# Patient Record
Sex: Female | Born: 1965
Health system: Southern US, Community
[De-identification: ages and names within clinical notes are randomized; demographics above are authoritative.]

## PROBLEM LIST (undated history)

## (undated) DIAGNOSIS — I10 Essential (primary) hypertension: Secondary | ICD-10-CM

## (undated) DIAGNOSIS — H0520 Unspecified exophthalmos: Secondary | ICD-10-CM

## (undated) DIAGNOSIS — E785 Hyperlipidemia, unspecified: Secondary | ICD-10-CM

## (undated) DIAGNOSIS — E119 Type 2 diabetes mellitus without complications: Secondary | ICD-10-CM

## (undated) DIAGNOSIS — G5600 Carpal tunnel syndrome, unspecified upper limb: Secondary | ICD-10-CM

## (undated) HISTORY — DX: Hyperlipidemia, unspecified: E78.5

## (undated) HISTORY — DX: Unspecified exophthalmos: H05.20

## (undated) HISTORY — DX: Essential (primary) hypertension: I10

## (undated) HISTORY — DX: Type 2 diabetes mellitus without complications: E11.9

## (undated) HISTORY — PX: WISDOM TOOTH EXTRACTION: SHX21

## (undated) HISTORY — PX: TONSILLECTOMY: SUR1361

## (undated) HISTORY — DX: Carpal tunnel syndrome, unspecified upper limb: G56.00

## (undated) HISTORY — PX: VITRECTOMY: SHX106

---

## 1998-11-02 ENCOUNTER — Other Ambulatory Visit: Admission: RE | Admit: 1998-11-02 | Discharge: 1998-11-02 | Payer: Self-pay | Admitting: Gynecology

## 1999-11-06 ENCOUNTER — Other Ambulatory Visit: Admission: RE | Admit: 1999-11-06 | Discharge: 1999-11-06 | Payer: Self-pay | Admitting: Gynecology

## 2000-11-25 ENCOUNTER — Other Ambulatory Visit: Admission: RE | Admit: 2000-11-25 | Discharge: 2000-11-25 | Payer: Self-pay | Admitting: Gynecology

## 2001-11-24 ENCOUNTER — Other Ambulatory Visit: Admission: RE | Admit: 2001-11-24 | Discharge: 2001-11-24 | Payer: Self-pay | Admitting: Gynecology

## 2002-01-03 ENCOUNTER — Emergency Department (HOSPITAL_COMMUNITY): Admission: EM | Admit: 2002-01-03 | Discharge: 2002-01-04 | Payer: Self-pay | Admitting: Emergency Medicine

## 2002-01-05 ENCOUNTER — Encounter: Payer: Self-pay | Admitting: Gynecology

## 2002-01-05 ENCOUNTER — Encounter: Admission: RE | Admit: 2002-01-05 | Discharge: 2002-01-05 | Payer: Self-pay | Admitting: Gynecology

## 2002-07-05 ENCOUNTER — Encounter: Admission: RE | Admit: 2002-07-05 | Discharge: 2002-07-05 | Payer: Self-pay | Admitting: General Surgery

## 2002-07-05 ENCOUNTER — Encounter: Payer: Self-pay | Admitting: General Surgery

## 2002-12-21 ENCOUNTER — Other Ambulatory Visit: Admission: RE | Admit: 2002-12-21 | Discharge: 2002-12-21 | Payer: Self-pay | Admitting: Gynecology

## 2003-12-21 ENCOUNTER — Other Ambulatory Visit: Admission: RE | Admit: 2003-12-21 | Discharge: 2003-12-21 | Payer: Self-pay | Admitting: Gynecology

## 2004-02-20 ENCOUNTER — Encounter: Payer: Self-pay | Admitting: Internal Medicine

## 2005-01-02 ENCOUNTER — Other Ambulatory Visit: Admission: RE | Admit: 2005-01-02 | Discharge: 2005-01-02 | Payer: Self-pay | Admitting: Gynecology

## 2005-01-15 ENCOUNTER — Ambulatory Visit: Payer: Self-pay | Admitting: Internal Medicine

## 2005-01-29 ENCOUNTER — Ambulatory Visit: Payer: Self-pay | Admitting: Internal Medicine

## 2005-06-25 ENCOUNTER — Ambulatory Visit: Payer: Self-pay | Admitting: Internal Medicine

## 2005-07-14 ENCOUNTER — Encounter: Admission: RE | Admit: 2005-07-14 | Discharge: 2005-10-12 | Payer: Self-pay | Admitting: Internal Medicine

## 2006-01-05 ENCOUNTER — Other Ambulatory Visit: Admission: RE | Admit: 2006-01-05 | Discharge: 2006-01-05 | Payer: Self-pay | Admitting: Gynecology

## 2006-02-02 ENCOUNTER — Ambulatory Visit: Payer: Self-pay | Admitting: Internal Medicine

## 2006-09-03 ENCOUNTER — Ambulatory Visit: Payer: Self-pay | Admitting: Internal Medicine

## 2006-10-12 ENCOUNTER — Ambulatory Visit: Payer: Self-pay | Admitting: Internal Medicine

## 2006-10-12 LAB — CONVERTED CEMR LAB
ALT: 15 units/L (ref 0–40)
AST: 16 units/L (ref 0–37)
Albumin: 3.5 g/dL (ref 3.5–5.2)
Alkaline Phosphatase: 33 units/L — ABNORMAL LOW (ref 39–117)
BUN: 7 mg/dL (ref 6–23)
Basophils Relative: 1.7 % — ABNORMAL HIGH (ref 0.0–1.0)
Cholesterol: 210 mg/dL (ref 0–200)
Eosinophil percent: 1 % (ref 0.0–5.0)
Free T4: 0.7 ng/dL — ABNORMAL LOW (ref 0.9–1.8)
GFR calc non Af Amer: 99 mL/min
HCT: 39.2 % (ref 36.0–46.0)
HDL: 59.6 mg/dL (ref >39.0)
Hgb A1c MFr Bld: 6.3 % — ABNORMAL HIGH (ref 4.6–6.0)
LDL DIRECT: 128.3 mg/dL
MCHC: 33.4 g/dL (ref 30.0–36.0)
Microalb Creat Ratio: 1.7 mg/g (ref 0.0–30.0)
Neutro Abs: 2.5 10*3/uL (ref 1.4–7.7)
Potassium: 3.8 meq/L (ref 3.5–5.1)
RBC: 4.04 M/uL (ref 3.87–5.11)
RDW: 13.1 % (ref 11.5–14.6)
Sodium: 140 meq/L (ref 135–145)
Total Bilirubin: 0.7 mg/dL (ref 0.3–1.2)
Total Protein: 6.8 g/dL (ref 6.0–8.3)
WBC: 4.4 10*3/uL — ABNORMAL LOW (ref 4.5–10.5)

## 2006-10-20 ENCOUNTER — Encounter: Admission: RE | Admit: 2006-10-20 | Discharge: 2006-10-20 | Payer: Self-pay | Admitting: Internal Medicine

## 2006-12-27 ENCOUNTER — Encounter: Admission: RE | Admit: 2006-12-27 | Discharge: 2006-12-27 | Payer: Self-pay

## 2007-01-13 ENCOUNTER — Other Ambulatory Visit: Admission: RE | Admit: 2007-01-13 | Discharge: 2007-01-13 | Payer: Self-pay | Admitting: Gynecology

## 2007-04-02 DIAGNOSIS — H0520 Unspecified exophthalmos: Secondary | ICD-10-CM | POA: Insufficient documentation

## 2007-04-07 ENCOUNTER — Encounter: Payer: Self-pay | Admitting: Internal Medicine

## 2007-04-07 ENCOUNTER — Ambulatory Visit: Payer: Self-pay | Admitting: Internal Medicine

## 2007-04-07 LAB — CONVERTED CEMR LAB
AST: 16 units/L (ref 0–37)
Free T4: 0.7 ng/dL (ref 0.6–1.6)
Microalb Creat Ratio: 2.4 mg/g (ref 0.0–30.0)
Microalb, Ur: 0.3 mg/dL (ref 0.0–1.9)
T3, Free: 2.5 pg/mL (ref 2.3–4.2)
TSH: 2.69 microintl units/mL (ref 0.35–5.50)
Total CHOL/HDL Ratio: 3.5

## 2007-04-21 ENCOUNTER — Encounter: Payer: Self-pay | Admitting: Internal Medicine

## 2007-05-04 ENCOUNTER — Ambulatory Visit: Payer: Self-pay | Admitting: Internal Medicine

## 2007-05-04 DIAGNOSIS — E785 Hyperlipidemia, unspecified: Secondary | ICD-10-CM

## 2007-05-04 DIAGNOSIS — E1169 Type 2 diabetes mellitus with other specified complication: Secondary | ICD-10-CM | POA: Insufficient documentation

## 2007-06-09 ENCOUNTER — Encounter: Admission: RE | Admit: 2007-06-09 | Discharge: 2007-06-09 | Payer: Self-pay | Admitting: Internal Medicine

## 2007-08-11 ENCOUNTER — Ambulatory Visit: Payer: Self-pay | Admitting: Internal Medicine

## 2007-08-15 LAB — CONVERTED CEMR LAB
Cholesterol: 220 mg/dL
Direct LDL: 141.3 mg/dL
HDL: 61.1 mg/dL
Hgb A1c MFr Bld: 6 %
Total CHOL/HDL Ratio: 3.6
Triglycerides: 106 mg/dL
VLDL: 21 mg/dL

## 2007-08-16 ENCOUNTER — Encounter (INDEPENDENT_AMBULATORY_CARE_PROVIDER_SITE_OTHER): Payer: Self-pay | Admitting: *Deleted

## 2007-08-16 ENCOUNTER — Ambulatory Visit: Payer: Self-pay | Admitting: Internal Medicine

## 2007-08-16 LAB — CONVERTED CEMR LAB
Cholesterol, target level: 200 mg/dL
LDL Goal: 100 mg/dL

## 2007-09-15 ENCOUNTER — Ambulatory Visit: Payer: Self-pay | Admitting: Internal Medicine

## 2007-10-20 ENCOUNTER — Encounter: Payer: Self-pay | Admitting: Internal Medicine

## 2007-10-22 ENCOUNTER — Telehealth (INDEPENDENT_AMBULATORY_CARE_PROVIDER_SITE_OTHER): Payer: Self-pay | Admitting: *Deleted

## 2007-11-12 ENCOUNTER — Telehealth (INDEPENDENT_AMBULATORY_CARE_PROVIDER_SITE_OTHER): Payer: Self-pay | Admitting: *Deleted

## 2008-01-31 ENCOUNTER — Other Ambulatory Visit: Admission: RE | Admit: 2008-01-31 | Discharge: 2008-01-31 | Payer: Self-pay | Admitting: Gynecology

## 2008-03-20 ENCOUNTER — Encounter: Admission: RE | Admit: 2008-03-20 | Discharge: 2008-03-20 | Payer: Self-pay

## 2008-03-20 ENCOUNTER — Telehealth (INDEPENDENT_AMBULATORY_CARE_PROVIDER_SITE_OTHER): Payer: Self-pay | Admitting: *Deleted

## 2008-03-30 ENCOUNTER — Ambulatory Visit: Payer: Self-pay | Admitting: Internal Medicine

## 2008-03-30 DIAGNOSIS — G5600 Carpal tunnel syndrome, unspecified upper limb: Secondary | ICD-10-CM | POA: Insufficient documentation

## 2008-04-04 ENCOUNTER — Encounter: Payer: Self-pay | Admitting: Internal Medicine

## 2008-04-06 ENCOUNTER — Encounter (INDEPENDENT_AMBULATORY_CARE_PROVIDER_SITE_OTHER): Payer: Self-pay | Admitting: *Deleted

## 2008-04-11 ENCOUNTER — Encounter (INDEPENDENT_AMBULATORY_CARE_PROVIDER_SITE_OTHER): Payer: Self-pay | Admitting: *Deleted

## 2008-04-25 ENCOUNTER — Telehealth (INDEPENDENT_AMBULATORY_CARE_PROVIDER_SITE_OTHER): Payer: Self-pay | Admitting: *Deleted

## 2008-06-14 ENCOUNTER — Encounter: Admission: RE | Admit: 2008-06-14 | Discharge: 2008-06-14 | Payer: Self-pay | Admitting: Internal Medicine

## 2008-07-11 ENCOUNTER — Ambulatory Visit: Payer: Self-pay | Admitting: Internal Medicine

## 2008-07-11 DIAGNOSIS — T887XXA Unspecified adverse effect of drug or medicament, initial encounter: Secondary | ICD-10-CM | POA: Insufficient documentation

## 2008-07-14 LAB — CONVERTED CEMR LAB
ALT: 17 units/L (ref 0–35)
AST: 15 units/L (ref 0–37)
BUN: 7 mg/dL (ref 6–23)
Potassium: 3.8 meq/L (ref 3.5–5.1)

## 2008-07-17 ENCOUNTER — Encounter (INDEPENDENT_AMBULATORY_CARE_PROVIDER_SITE_OTHER): Payer: Self-pay | Admitting: *Deleted

## 2008-07-21 ENCOUNTER — Encounter: Payer: Self-pay | Admitting: Internal Medicine

## 2008-08-18 ENCOUNTER — Ambulatory Visit: Payer: Self-pay | Admitting: Internal Medicine

## 2008-12-19 ENCOUNTER — Ambulatory Visit: Payer: Self-pay | Admitting: Internal Medicine

## 2008-12-26 ENCOUNTER — Telehealth (INDEPENDENT_AMBULATORY_CARE_PROVIDER_SITE_OTHER): Payer: Self-pay | Admitting: *Deleted

## 2008-12-29 ENCOUNTER — Ambulatory Visit: Payer: Self-pay | Admitting: Internal Medicine

## 2009-01-17 ENCOUNTER — Encounter: Payer: Self-pay | Admitting: Internal Medicine

## 2009-02-26 ENCOUNTER — Ambulatory Visit: Payer: Self-pay | Admitting: Internal Medicine

## 2009-03-07 ENCOUNTER — Encounter (INDEPENDENT_AMBULATORY_CARE_PROVIDER_SITE_OTHER): Payer: Self-pay | Admitting: *Deleted

## 2009-03-07 LAB — CONVERTED CEMR LAB: BUN: 8 mg/dL (ref 6–23)

## 2009-03-27 ENCOUNTER — Encounter: Payer: Self-pay | Admitting: Internal Medicine

## 2009-05-28 ENCOUNTER — Ambulatory Visit: Payer: Self-pay | Admitting: Internal Medicine

## 2009-06-10 LAB — CONVERTED CEMR LAB: Hgb A1c MFr Bld: 6.3 % (ref 4.6–6.5)

## 2009-06-11 ENCOUNTER — Encounter (INDEPENDENT_AMBULATORY_CARE_PROVIDER_SITE_OTHER): Payer: Self-pay | Admitting: *Deleted

## 2009-06-14 ENCOUNTER — Telehealth (INDEPENDENT_AMBULATORY_CARE_PROVIDER_SITE_OTHER): Payer: Self-pay | Admitting: *Deleted

## 2009-06-15 ENCOUNTER — Telehealth (INDEPENDENT_AMBULATORY_CARE_PROVIDER_SITE_OTHER): Payer: Self-pay | Admitting: *Deleted

## 2009-06-20 ENCOUNTER — Encounter: Admission: RE | Admit: 2009-06-20 | Discharge: 2009-06-20 | Payer: Self-pay | Admitting: Internal Medicine

## 2009-07-06 ENCOUNTER — Encounter: Payer: Self-pay | Admitting: Internal Medicine

## 2009-07-11 ENCOUNTER — Ambulatory Visit: Payer: Self-pay | Admitting: Internal Medicine

## 2009-07-11 DIAGNOSIS — E119 Type 2 diabetes mellitus without complications: Secondary | ICD-10-CM | POA: Insufficient documentation

## 2009-07-17 ENCOUNTER — Encounter: Admission: RE | Admit: 2009-07-17 | Discharge: 2009-07-17 | Payer: Self-pay | Admitting: Internal Medicine

## 2009-07-18 ENCOUNTER — Encounter (INDEPENDENT_AMBULATORY_CARE_PROVIDER_SITE_OTHER): Payer: Self-pay | Admitting: *Deleted

## 2009-07-25 ENCOUNTER — Encounter: Payer: Self-pay | Admitting: Internal Medicine

## 2009-11-17 HISTORY — PX: TOTAL ABDOMINAL HYSTERECTOMY: SHX209

## 2009-11-17 HISTORY — PX: ABDOMINAL HYSTERECTOMY: SHX81

## 2009-12-10 ENCOUNTER — Ambulatory Visit: Payer: Self-pay | Admitting: Internal Medicine

## 2009-12-14 LAB — CONVERTED CEMR LAB
ALT: 20 units/L (ref 0–35)
AST: 15 units/L (ref 0–37)
Basophils Relative: 0.4 % (ref 0.0–3.0)
Bilirubin, Direct: 0 mg/dL (ref 0.0–0.3)
Creatinine, Ser: 0.6 mg/dL (ref 0.4–1.2)
Eosinophils Absolute: 0 10*3/uL (ref 0.0–0.7)
Eosinophils Relative: 0.8 % (ref 0.0–5.0)
Hemoglobin: 13.1 g/dL (ref 12.0–15.0)
Lymphocytes Relative: 41.8 % (ref 12.0–46.0)
MCHC: 33.4 g/dL (ref 30.0–36.0)
Monocytes Relative: 5.8 % (ref 3.0–12.0)
Neutro Abs: 3 10*3/uL (ref 1.4–7.7)
RBC: 4.05 M/uL (ref 3.87–5.11)
Total CHOL/HDL Ratio: 3
Total Protein: 7.9 g/dL (ref 6.0–8.3)
Triglycerides: 108 mg/dL (ref 0.0–149.0)
WBC: 5.7 10*3/uL (ref 4.5–10.5)

## 2010-01-23 ENCOUNTER — Encounter: Payer: Self-pay | Admitting: Internal Medicine

## 2010-04-16 ENCOUNTER — Inpatient Hospital Stay (HOSPITAL_COMMUNITY): Admission: RE | Admit: 2010-04-16 | Discharge: 2010-04-18 | Payer: Self-pay | Admitting: Obstetrics and Gynecology

## 2010-04-16 ENCOUNTER — Encounter (INDEPENDENT_AMBULATORY_CARE_PROVIDER_SITE_OTHER): Payer: Self-pay | Admitting: Obstetrics and Gynecology

## 2010-06-10 ENCOUNTER — Ambulatory Visit: Payer: Self-pay | Admitting: Internal Medicine

## 2010-06-24 ENCOUNTER — Encounter: Admission: RE | Admit: 2010-06-24 | Discharge: 2010-06-24 | Payer: Self-pay | Admitting: Internal Medicine

## 2010-09-04 ENCOUNTER — Encounter: Payer: Self-pay | Admitting: Internal Medicine

## 2010-09-10 ENCOUNTER — Ambulatory Visit: Payer: Self-pay | Admitting: Internal Medicine

## 2010-09-16 LAB — CONVERTED CEMR LAB
BUN: 11 mg/dL (ref 6–23)
Creatinine, Ser: 0.6 mg/dL (ref 0.4–1.2)
Hgb A1c MFr Bld: 6.6 % — ABNORMAL HIGH (ref 4.6–6.5)

## 2010-09-17 ENCOUNTER — Ambulatory Visit: Payer: Self-pay | Admitting: Internal Medicine

## 2010-09-17 ENCOUNTER — Encounter: Payer: Self-pay | Admitting: Internal Medicine

## 2010-12-15 LAB — CONVERTED CEMR LAB
BUN: 9 mg/dL (ref 6–23)
Cholesterol, target level: 200 mg/dL
Creatinine, Ser: 0.7 mg/dL (ref 0.4–1.2)
Free T4: 0.9 ng/dL (ref 0.6–1.6)
HDL goal, serum: 40 mg/dL
LDL Goal: 100 mg/dL
LDL Goal: 130 mg/dL
T3, Free: 2.8 pg/mL (ref 2.3–4.2)
TSH: 3.61 microintl units/mL (ref 0.35–5.50)

## 2010-12-19 NOTE — Assessment & Plan Note (Signed)
Summary: CPX/KN   Vital Signs:  Patient profile:   45 year old female Height:      60 inches Weight:      168.8 pounds BMI:     33.09 Temp:     98.3 degrees F oral Pulse rate:   99 / minute Resp:     16 per minute BP sitting:   141 / 88  (left arm) Cuff size:   large  Vitals Entered By: Shonna Chock CMA (September 17, 2010 1:05 PM) CC: CPX, Recheck on B/P 132/86, Type 2 diabetes mellitus follow-up   CC:  CPX, Recheck on B/P 132/86, and Type 2 diabetes mellitus follow-up.  History of Present Illness:  Sonia Little  is here for a physical; she ia asymptomatic. Type 2 Diabetes Mellitus Follow-Up        The patient reports self managed hypoglycemia if meals delayed, weight loss of 30 # in past year, and numbness of extremities intermittently from CTS. She  denies polyuria, polydipsia, and blurred vision.  The patient denies the following symptoms: neuropathic pain, chest pain, vomiting, orthostatic symptoms, poor wound healing, intermittent claudication, vision loss, and foot ulcer.  Since the last visit the patient reports good dietary compliance and exercising regularly.  The patient has been measuring capillary blood glucose before breakfast  ( 88-110)and after 2 hrs after  dinner ( 105-160).  Since the last visit, the patient reports having had eye care by an Ophthalmologist; no retinopathy.  A1c was 5.9% ( average sugar 123, risk 18%) in 05/2010, now 6.6% ( average sugar 143, long term risk 32%) off Glymiperide   Current Medications (verified): 1)  Fexofenadine Hcl 180 Mg Tabs (Fexofenadine Hcl) .Marland Kitchen.. 1 By Mouth Once Daily 2)  [id number] 3)  Metformin Hcl Xr 24   500 Mg  Tabs (Metformin Hcl) .Marland Kitchen.. 1 Two Times A Day With2 Largest Meals  Allergies: 1)  ! Jonne Ply  Past History:  Past Medical History: DIABETES-TYPE 2 (ICD-250.00) HYPERLIPIDEMIA NEC/NOS (ICD-272.4):MINIMAL goal  LDL goal = <135  , ideally < 100 based on NMR Lipoprofile EXOPHTHALMOS NOS (ICD-376.30) with normal Thyroid Function  Tests  Past Surgical History: Tonsillectomy; Wisdom Teeth extraction; bronchitis , hospitalized as infant gravid 0 para 0, Dr Chevis Pretty Hysterectomy  2011 for fibroids with pelvic pain  Family History: Father: HTN, DM, CHF, renal insufficiency Mother: DM, HTN Siblings: sister & bro: HTN; Maternal aunts & uncles: DM  Social History: Never Smoked Alcohol use-no Occupation:Financial Consultant Regular exercise-yes: 3-4X/week as stationary bike  Review of Systems  The patient denies anorexia, fever, hoarseness, syncope, dyspnea on exertion, peripheral edema, prolonged cough, headaches, hemoptysis, abdominal pain, melena, hematochezia, severe indigestion/heartburn, hematuria, incontinence, suspicious skin lesions, depression, unusual weight change, abnormal bleeding, enlarged lymph nodes, and angioedema.    Physical Exam  General:  well-nourished; alert,appropriate and cooperative throughout examination Head:  Normocephalic and atraumatic without obvious abnormalities.  Eyes:  No corneal or conjunctival inflammation noted. EOMI with slight  lid lag. Prominent globes. Perrla. Funduscopic exam benign, without hemorrhages, exudates or papilledema.  Ears:  External ear exam shows no significant lesions or deformities.  Otoscopic examination reveals clear canals, tympanic membranes are intact bilaterally without bulging, retraction, inflammation or discharge. Hearing is grossly normal bilaterally. Nose:  External nasal examination shows no deformity or inflammation. Nasal mucosa are pink and moist without lesions or exudates. Mouth:  Oral mucosa and oropharynx without lesions or exudates.  Teeth in good repair. Neck:  No deformities, masses, or tenderness noted. Lungs:  Normal  respiratory effort, chest expands symmetrically. Lungs are clear to auscultation, no crackles or wheezes. Heart:  Normal rate and regular rhythm. S1 and S2 normal without gallop, murmur, click, rub . S4 Abdomen:  Bowel  sounds positive,abdomen soft and non-tender without masses, organomegaly or hernias noted. Genitalia:  Dr Chevis Pretty Msk:  No deformity or scoliosis noted of thoracic or lumbar spine.   Pulses:  R and L carotid,radial,dorsalis pedis and posterior tibial pulses are full and equal bilaterally Extremities:  No clubbing, cyanosis, edema, or deformity noted with normal full range of motion of all joints.  Good nail health  Neurologic:  alert & oriented X3, sensation intact to light touch over feet, and DTRs symmetrical and normal.  + Tinel's sign  LUE  Skin:  Intact without suspicious lesions or rashes Cervical Nodes:  No lymphadenopathy noted Axillary Nodes:  No palpable lymphadenopathy Psych:  memory intact for recent and remote and normally interactive.   Focused & intelligent   Impression & Recommendations:  Problem # 1:  ROUTINE GENERAL MEDICAL EXAM@HEALTH  CARE FACL (ICD-V70.0)  Orders: EKG w/ Interpretation (93000)  Problem # 2:  DIABETES MELLITUS, CONTROLLED (ICD-250.00)  Her updated medication list for this problem includes:    Janumet 50-500 Mg Tabs (Sitagliptin-metformin hcl) .Marland Kitchen... 1 two times a day with 2 largest meals  Problem # 3:  HYPERLIPIDEMIA NEC/NOS (ICD-272.4)  Problem # 4:  CARPAL TUNNEL SYNDROME (ICD-354.0)  Problem # 5:  EXOPHTHALMOS NOS (ICD-376.30) with normal TFTs  Complete Medication List: 1)  Fexofenadine Hcl 180 Mg Tabs (Fexofenadine hcl) .Marland Kitchen.. 1 by mouth once daily 2)  O82fa  3)  Janumet 50-500 Mg Tabs (Sitagliptin-metformin hcl) .Marland Kitchen.. 1 two times a day with 2 largest meals  Patient Instructions: 1)   Consume < 30  grams of HFCS sugar / day as discussed.Please schedule a follow-up appointment in 4 months. 2)  BUN,creat, K+ prior to visit, ICD-9:995.20 3)  Hepatic Panel prior to visit, ICD-9:995.20 4)  Lipid Panel prior to visit, ICD-9:250.00 5)  TSH prior to visit, ICD-9:354.0 6)  HbgA1C prior to visit, ICD-9:250.00 7)  Urine Microalbumin prior to visit,  ICD-9:250.00. Prescriptions: JANUMET 50-500 MG TABS (SITAGLIPTIN-METFORMIN HCL) 1 two times a day with 2 largest meals  #60 x 5   Entered and Authorized by:   Marga Melnick MD   Signed by:   Marga Melnick MD on 09/17/2010   Method used:   Print then Give to Patient   RxID:   [id number]    Orders Added: 1)  Est. Patient 40-64 years [99396] 2)  EKG w/ Interpretation [93000]

## 2010-12-19 NOTE — Letter (Signed)
Summary: [name]  [name]   Imported By: Lanelle Bal 10/01/2010 09:03:24  _____________________________________________________________________  External Attachment:    Type:   Image     Comment:   External Document

## 2010-12-19 NOTE — Letter (Signed)
Summary: [facility] / [place]  [facility] / [place]   Imported By: Lennie Odor 09/20/2010 15:24:05  _____________________________________________________________________  External Attachment:    Type:   Image     Comment:   External Document

## 2010-12-19 NOTE — Letter (Signed)
Summary: [facility]  [facility]   Imported By: Lanelle Bal 02/25/2010 08:35:42  _____________________________________________________________________  External Attachment:    Type:   Image     Comment:   External Document

## 2011-01-15 ENCOUNTER — Encounter (INDEPENDENT_AMBULATORY_CARE_PROVIDER_SITE_OTHER): Payer: Self-pay | Admitting: *Deleted

## 2011-01-15 ENCOUNTER — Other Ambulatory Visit (INDEPENDENT_AMBULATORY_CARE_PROVIDER_SITE_OTHER): Payer: BC Managed Care – PPO

## 2011-01-15 ENCOUNTER — Other Ambulatory Visit: Payer: Self-pay | Admitting: Internal Medicine

## 2011-01-15 DIAGNOSIS — G5600 Carpal tunnel syndrome, unspecified upper limb: Secondary | ICD-10-CM

## 2011-01-15 DIAGNOSIS — T887XXA Unspecified adverse effect of drug or medicament, initial encounter: Secondary | ICD-10-CM

## 2011-01-15 DIAGNOSIS — E119 Type 2 diabetes mellitus without complications: Secondary | ICD-10-CM

## 2011-01-15 DIAGNOSIS — E785 Hyperlipidemia, unspecified: Secondary | ICD-10-CM

## 2011-01-15 LAB — LIPID PANEL
Cholesterol: 213 mg/dL — ABNORMAL HIGH (ref 0–200)
HDL: 55.1 mg/dL (ref >39.00)
Triglycerides: 75 mg/dL (ref 0.0–149.0)

## 2011-01-15 LAB — CREATININE, SERUM: Creatinine, Ser: 0.5 mg/dL (ref 0.4–1.2)

## 2011-01-15 LAB — LDL CHOLESTEROL, DIRECT: Direct LDL: 148.9 mg/dL

## 2011-01-15 LAB — HEPATIC FUNCTION PANEL
ALT: 14 U/L (ref 0–35)
Albumin: 4 g/dL (ref 3.5–5.2)
Total Protein: 6.9 g/dL (ref 6.0–8.3)

## 2011-01-15 LAB — MICROALBUMIN / CREATININE URINE RATIO
Creatinine,U: 102.6 mg/dL
Microalb Creat Ratio: 0.7 mg/g (ref 0.0–30.0)

## 2011-01-15 LAB — BUN: BUN: 9 mg/dL (ref 6–23)

## 2011-01-22 ENCOUNTER — Ambulatory Visit (INDEPENDENT_AMBULATORY_CARE_PROVIDER_SITE_OTHER): Payer: BC Managed Care – PPO | Admitting: Internal Medicine

## 2011-01-22 ENCOUNTER — Encounter: Payer: Self-pay | Admitting: Internal Medicine

## 2011-01-22 DIAGNOSIS — E119 Type 2 diabetes mellitus without complications: Secondary | ICD-10-CM

## 2011-01-22 DIAGNOSIS — E785 Hyperlipidemia, unspecified: Secondary | ICD-10-CM

## 2011-01-28 NOTE — Assessment & Plan Note (Signed)
Summary: 4 month followup/sph   Vital Signs:  Patient profile:   45 year old female Weight:      168.4 pounds BMI:     33.01 Pulse rate:   76 / minute Resp:     15 per minute BP sitting:   120 / 72  (left arm) Cuff size:   large  Vitals Entered By: Shonna Chock CMA (January 22, 2011 9:09 AM) CC: 4 month follow-up ( copy of labs given) , Type 2 diabetes mellitus follow-up   CC:  4 month follow-up ( copy of labs given)  and Type 2 diabetes mellitus follow-up.  History of Present Illness:    Labs reviewed & risks discussed; LDL of  149 has 49% increased risk ; A1c of 6.3% has 26% risk.Simvastatin caused reflux @ night.She  denies the following symptoms: chest pain/pressure, exercise intolerance, dypsnea, palpitations, syncope, and pedal edema.  Dietary compliance has been good.  The patient reports exercising 3-4X per week as biking or Elliptical w/o symptoms.  Adjunctive measures currently used by the patient include fish oil supplements.      She  reports numbness of  hands from CTS, but denies polyuria, polydipsia, blurred vision, self managed hypoglycemia, weight loss, and weight gain.  The patient denies the following symptoms: neuropathic pain, orthostatic symptoms, poor wound healing, vision loss, and foot ulcer.  The patient has been measuring capillary blood glucose before breakfast , 87-111 and 2 hrs after dinner, < 160.  Since the last visit, the patient reports having had eye care by an Ophthalmologist and no foot care.    Current Medications (verified): 1)  Fexofenadine Hcl 180 Mg Tabs (Fexofenadine Hcl) .Marland Kitchen.. 1 By Mouth Once Daily 2)  O49fa 3)  Janumet 50-500 Mg Tabs (Sitagliptin-Metformin Hcl) .Marland Kitchen.. 1 Two Times A Day With 2 Largest Meals  Allergies: 1)  ! Asa 2)  ! Simvastatin (Simvastatin)  Physical Exam  General:  well-nourished; alert,appropriate and cooperative throughout examination Lungs:  Normal respiratory effort, chest expands symmetrically. Lungs are clear to  auscultation, no crackles or wheezes. Heart:  Normal rate and regular rhythm. S1 and S2 normal without gallop, murmur, click, rub . Slight slurring with S4 Pulses:  R and L carotid,radial,dorsalis pedis and posterior tibial pulses are full and equal bilaterally Extremities:  No clubbing, cyanosis, edema, or deformity noted . Good nail health Neurologic:  alert & oriented X3 and sensation intact to light touch over feet.   Skin:  Intact without suspicious lesions or rashes Psych:  Focused & intelligent   Impression & Recommendations:  Problem # 1:  DIABETES MELLITUS, CONTROLLED (ICD-250.00) excellent control Her updated medication list for this problem includes:    Janumet 50-500 Mg Tabs (Sitagliptin-metformin hcl) .Marland Kitchen... 1 two times a day with 2 largest meals  Problem # 2:  HYPERLIPIDEMIA NEC/NOS (ICD-272.4)  50% increased risk with present LDL  Her updated medication list for this problem includes:    Lipitor 10 Mg Tabs (Atorvastatin calcium) .Marland Kitchen... 1 once daily  Complete Medication List: 1)  Fexofenadine Hcl 180 Mg Tabs (Fexofenadine hcl) .Marland Kitchen.. 1 by mouth once daily 2)  O55fa  3)  Janumet 50-500 Mg Tabs (Sitagliptin-metformin hcl) .Marland Kitchen.. 1 two times a day with 2 largest meals 4)  Lipitor 10 Mg Tabs (Atorvastatin calcium) .Marland Kitchen.. 1 once daily  Patient Instructions: 1)  Check your blood sugars regularly. If your readings are usually above :150  or below 90 you should contact our office. 2)  It is important that  your Diabetic A1c level is checked every 6  months. 3)  See your eye doctor yearly to check for diabetic eye damage. 4)  Check your feet each night for sore areas, calluses or signs of infection. 5)  Please schedule a follow-up appointment in 3 months. 6)  Hepatic Panel prior to visit, ICD-9:995.20 7)  Lipid Panel prior to visit, ICD-9:272.4 Prescriptions: LIPITOR 10 MG TABS (ATORVASTATIN CALCIUM) 1 once daily  #30 x 2   Entered and Authorized by:   Marga Melnick MD   Signed  by:   Marga Melnick MD on 01/22/2011   Method used:   Print then Give to Patient   RxID:   [id number] JANUMET 50-500 MG TABS (SITAGLIPTIN-METFORMIN HCL) 1 two times a day with 2 largest meals  #60 x 11   Entered and Authorized by:   Marga Melnick MD   Signed by:   Marga Melnick MD on 01/22/2011   Method used:   Print then Give to Patient   RxID:   [id number]    Orders Added: 1)  Est. Patient Level III [95284]

## 2011-02-03 LAB — COMPREHENSIVE METABOLIC PANEL
ALT: 13 U/L (ref 0–35)
AST: 14 U/L (ref 0–37)
Alkaline Phosphatase: 35 U/L — ABNORMAL LOW (ref 39–117)
CO2: 29 mEq/L (ref 19–32)
Calcium: 9 mg/dL (ref 8.4–10.5)
GFR calc Af Amer: >60 mL/min (ref >60)
Glucose, Bld: 97 mg/dL (ref 70–99)
Potassium: 3.5 mEq/L (ref 3.5–5.1)
Sodium: 138 mEq/L (ref 135–145)
Total Protein: 5.7 g/dL — ABNORMAL LOW (ref 6.0–8.3)

## 2011-02-03 LAB — GLUCOSE, CAPILLARY
Glucose-Capillary: 104 mg/dL — ABNORMAL HIGH (ref 70–99)
Glucose-Capillary: 111 mg/dL — ABNORMAL HIGH (ref 70–99)
Glucose-Capillary: 112 mg/dL — ABNORMAL HIGH (ref 70–99)
Glucose-Capillary: 195 mg/dL — ABNORMAL HIGH (ref 70–99)

## 2011-02-03 LAB — CBC
Hemoglobin: 11.1 g/dL — ABNORMAL LOW (ref 12.0–15.0)
MCHC: 34.4 g/dL (ref 30.0–36.0)
MCV: 95 fL (ref 78.0–100.0)
Platelets: 353 10*3/uL (ref 150–400)
RBC: 3.38 MIL/uL — ABNORMAL LOW (ref 3.87–5.11)
RDW: 12.8 % (ref 11.5–15.5)

## 2011-02-03 LAB — BASIC METABOLIC PANEL
BUN: 6 mg/dL (ref 6–23)
CO2: 26 mEq/L (ref 19–32)
Calcium: 9.9 mg/dL (ref 8.4–10.5)
Chloride: 102 mEq/L (ref 96–112)
Creatinine, Ser: 0.59 mg/dL (ref 0.4–1.2)
Glucose, Bld: 126 mg/dL — ABNORMAL HIGH (ref 70–99)

## 2011-02-03 LAB — PREGNANCY, URINE: Preg Test, Ur: NEGATIVE

## 2011-04-01 ENCOUNTER — Other Ambulatory Visit: Payer: Self-pay | Admitting: *Deleted

## 2011-04-01 DIAGNOSIS — T887XXA Unspecified adverse effect of drug or medicament, initial encounter: Secondary | ICD-10-CM

## 2011-04-01 DIAGNOSIS — E785 Hyperlipidemia, unspecified: Secondary | ICD-10-CM

## 2011-04-02 ENCOUNTER — Other Ambulatory Visit (INDEPENDENT_AMBULATORY_CARE_PROVIDER_SITE_OTHER): Payer: BC Managed Care – PPO

## 2011-04-02 DIAGNOSIS — T887XXA Unspecified adverse effect of drug or medicament, initial encounter: Secondary | ICD-10-CM

## 2011-04-02 DIAGNOSIS — E785 Hyperlipidemia, unspecified: Secondary | ICD-10-CM

## 2011-04-02 LAB — HEPATIC FUNCTION PANEL
ALT: 16 U/L (ref 0–35)
AST: 12 U/L (ref 0–37)
Alkaline Phosphatase: 44 U/L (ref 39–117)
Bilirubin, Direct: 0.1 mg/dL (ref 0.0–0.3)
Total Bilirubin: 0.7 mg/dL (ref 0.3–1.2)

## 2011-04-02 LAB — LIPID PANEL
LDL Cholesterol: 103 mg/dL — ABNORMAL HIGH (ref 0–99)
Total CHOL/HDL Ratio: 3
Triglycerides: 60 mg/dL (ref 0.0–149.0)

## 2011-04-16 ENCOUNTER — Telehealth: Payer: Self-pay

## 2011-04-16 NOTE — Telephone Encounter (Signed)
Pt called to get labs done 5/16 see copy on ledge

## 2011-04-16 NOTE — Telephone Encounter (Signed)
Your values are excellent.   Risk of premature heart attack or stroke increases as LDL or BAD cholesterol rises.Advanced cholesterol panels optimally determine risk base on particle composition ( NMR Lipoprofile ) or by assessing multiple other genetic risks([place] Heart Panel 1304X). These are indicated when LDL is > 130, especially if there is family history of heart attack in males before 37 or women before 58 .  HDL or good cholesterol goal  is greater than 40 in men  and greater than 50 in women. Interventions to raise HDL or GOOD cholesterol include: exercising 30-45 minutes 3-4 X per week; including salmon & tuna in the diet;  & supplementing with Omega 3 fatty acids (Flax or Fish oil )  1-2 grams per day. The B vitamin Niacin also raises HDL but has a vasodilating effect which may cause flushing. [employer]

## 2011-04-17 NOTE — Telephone Encounter (Signed)
Spoke w/ pt aware of labs.  

## 2011-04-20 ENCOUNTER — Other Ambulatory Visit: Payer: Self-pay | Admitting: Internal Medicine

## 2011-05-22 ENCOUNTER — Other Ambulatory Visit: Payer: Self-pay | Admitting: Internal Medicine

## 2011-05-22 DIAGNOSIS — Z1231 Encounter for screening mammogram for malignant neoplasm of breast: Secondary | ICD-10-CM

## 2011-06-26 ENCOUNTER — Ambulatory Visit
Admission: RE | Admit: 2011-06-26 | Discharge: 2011-06-26 | Disposition: A | Discharge status: Home / Self Care | Payer: BC Managed Care – PPO | Source: Ambulatory Visit | Attending: Internal Medicine | Admitting: Internal Medicine

## 2011-06-26 DIAGNOSIS — Z1231 Encounter for screening mammogram for malignant neoplasm of breast: Secondary | ICD-10-CM

## 2011-07-31 ENCOUNTER — Other Ambulatory Visit: Payer: Self-pay | Admitting: Internal Medicine

## 2011-07-31 DIAGNOSIS — E785 Hyperlipidemia, unspecified: Secondary | ICD-10-CM

## 2011-07-31 DIAGNOSIS — T887XXA Unspecified adverse effect of drug or medicament, initial encounter: Secondary | ICD-10-CM

## 2011-08-01 ENCOUNTER — Other Ambulatory Visit (INDEPENDENT_AMBULATORY_CARE_PROVIDER_SITE_OTHER): Payer: BC Managed Care – PPO

## 2011-08-01 DIAGNOSIS — E785 Hyperlipidemia, unspecified: Secondary | ICD-10-CM

## 2011-08-01 DIAGNOSIS — T887XXA Unspecified adverse effect of drug or medicament, initial encounter: Secondary | ICD-10-CM

## 2011-08-01 LAB — HEPATIC FUNCTION PANEL
Albumin: 3.9 g/dL (ref 3.5–5.2)
Alkaline Phosphatase: 51 U/L (ref 39–117)
Total Bilirubin: 0.5 mg/dL (ref 0.3–1.2)

## 2011-08-01 LAB — LIPID PANEL
Cholesterol: 151 mg/dL (ref 0–200)
HDL: 49.1 mg/dL (ref >39.00)
LDL Cholesterol: 91 mg/dL (ref 0–99)
VLDL: 10.8 mg/dL (ref 0.0–40.0)

## 2011-08-01 NOTE — Progress Notes (Signed)
Labs only

## 2011-08-08 ENCOUNTER — Encounter: Payer: Self-pay | Admitting: Internal Medicine

## 2011-08-08 ENCOUNTER — Ambulatory Visit (INDEPENDENT_AMBULATORY_CARE_PROVIDER_SITE_OTHER): Payer: BC Managed Care – PPO | Admitting: Internal Medicine

## 2011-08-08 DIAGNOSIS — E119 Type 2 diabetes mellitus without complications: Secondary | ICD-10-CM

## 2011-08-08 DIAGNOSIS — E785 Hyperlipidemia, unspecified: Secondary | ICD-10-CM

## 2011-08-08 LAB — HEMOGLOBIN A1C: Hgb A1c MFr Bld: 6.3 % (ref 4.6–6.5)

## 2011-08-08 MED ORDER — RAMIPRIL 2.5 MG PO CAPS: 2.5000 mg | ORAL_CAPSULE | Freq: Every day | ORAL | Status: DC

## 2011-08-08 NOTE — Patient Instructions (Signed)
Eat a low-fat diet with lots of fruits and vegetables, up to 7-9 servings per day. Avoid obesity; your goal is waist measurement < 40 inches.Consume less than 40 grams of sugar per day from foods & drinks with High Fructose Corn Sugar as #1,2,3 or # 4 on label. Follow the low carb nutrition program in [employer] as closely as possible to prevent Diabetes progression & complications. White carbohydrates (potatoes, rice, bread, and pasta) have a high spike of sugar and a high load of sugar. For example a  baked potato has a cup of sugar and a  french fry  2 teaspoons of sugar. Yams, wild  rice, whole grained bread &  wheat pasta have been much lower spike and load of  sugar. Portions should be the size of a deck of cards or your palm.  

## 2011-08-08 NOTE — Progress Notes (Signed)
  Subjective:    Patient ID: Sonia Little, female    DOB: [birth year], 45 y.o.   MRN: [id number]  HPI HYPERLIPIDEMIA: Disease Monitoring: LDL 91, HDL 49.10 Chest pain, palpitations- no       Dyspnea- no Medications: Compliance- yes  Lightheadedness,Syncope- no    Edema- no  DIABETES: Disease Monitoring: Blood Sugar ranges-FBS 87- 114  Polyuria/phagia/dipsia- no       Visual problems- no ; last Ophth exam 8/12: negative Medications: Compliance- yes  Hypoglycemic symptoms- very rare         Smoking Status :never Diet: no plan Exercise: bike/ elliptical 30 min 3-5X/ week       Review of Systems Abd pain, bowel changes- no   Muscle aches- no except with CVE     Objective:   Physical Exam Gen.: Healthy  & well-nourished, appropriate and alert, weight Eyes: No lid/conjunctival changes, extraocular motion intact, prominent eyes  Neck: Normal  thyroid Respiratory: No increased work of breathing or abnormal breath sounds Cardiac : regular rhythm, no extra heart sounds, gallop, murmur. S4 Abdomen: No organomegaly ,masses, or aortic enlargement Lymph: No lymphadenopathy of the neck or axilla Skin: No rashes, lesions, ulcers or ischemic changes Muscle skeletal: no nail changes; joints normal Vasc:All pulses intact, no bruits present Neuro: Normal deep tendon reflexes, alert & oriented, sensation over feet normal Psych: judgment and insight, mood and affect normal        Assessment & Plan:  #1 dyslipidemia, goals met  #2 diabetes; fasting blood sugar suggests excellent control  Plan: Check A1c and urine microalbumin. Very low dose ACE inhibitor should be considered prophylactically.

## 2011-09-07 ENCOUNTER — Encounter: Payer: Self-pay | Admitting: Internal Medicine

## 2012-02-02 ENCOUNTER — Other Ambulatory Visit: Payer: Self-pay | Admitting: Internal Medicine

## 2012-02-04 ENCOUNTER — Other Ambulatory Visit (INDEPENDENT_AMBULATORY_CARE_PROVIDER_SITE_OTHER): Payer: BC Managed Care – PPO

## 2012-02-04 DIAGNOSIS — E119 Type 2 diabetes mellitus without complications: Secondary | ICD-10-CM

## 2012-02-04 LAB — HEMOGLOBIN A1C: Hgb A1c MFr Bld: 6.2 % (ref 4.6–6.5)

## 2012-02-06 ENCOUNTER — Other Ambulatory Visit: Payer: Self-pay | Admitting: Gynecology

## 2012-03-18 ENCOUNTER — Ambulatory Visit (INDEPENDENT_AMBULATORY_CARE_PROVIDER_SITE_OTHER): Payer: BC Managed Care – PPO | Admitting: Internal Medicine

## 2012-03-18 ENCOUNTER — Encounter: Payer: Self-pay | Admitting: Internal Medicine

## 2012-03-18 VITALS — BP 118/84 | HR 90 | Temp 98.0°F | Wt 175.8 lb

## 2012-03-18 DIAGNOSIS — M7541 Impingement syndrome of right shoulder: Secondary | ICD-10-CM

## 2012-03-18 DIAGNOSIS — E785 Hyperlipidemia, unspecified: Secondary | ICD-10-CM

## 2012-03-18 DIAGNOSIS — R030 Elevated blood-pressure reading, without diagnosis of hypertension: Secondary | ICD-10-CM | POA: Insufficient documentation

## 2012-03-18 DIAGNOSIS — M25519 Pain in unspecified shoulder: Secondary | ICD-10-CM

## 2012-03-18 DIAGNOSIS — E119 Type 2 diabetes mellitus without complications: Secondary | ICD-10-CM

## 2012-03-18 MED ORDER — METFORMIN HCL ER 500 MG PO TB24: 500.0000 mg | ORAL_TABLET | Freq: Every day | ORAL | Status: DC

## 2012-03-18 MED ORDER — RAMIPRIL 2.5 MG PO CAPS: ORAL_CAPSULE | ORAL | Status: DC

## 2012-03-18 MED ORDER — ATORVASTATIN CALCIUM 10 MG PO TABS: ORAL_TABLET | ORAL | Status: DC

## 2012-03-18 NOTE — Assessment & Plan Note (Signed)
Lipids were excellent 08/01/11. No adverse effects from statin

## 2012-03-18 NOTE — Assessment & Plan Note (Signed)
Diabetic control is excellent with an A1c of 6.2% and no hypoglycemia. Ophthalmologic exam is up to date. No foot care to date.  I recommend that we change the Janumet 50/500 to  Metformin alone  in view the A1c 6 ranging from 6.2-6.3 over past 15 mos

## 2012-03-18 NOTE — Progress Notes (Signed)
  Subjective:    Patient ID: Sonia Little, female    DOB: [birth year], 46 y.o.   MRN: [id number]  HPI HYPERTENSION: Disease Monitoring: Blood pressure range-not monitored; < 130/?80 when checked @ drug store  Chest pain, palpitations- no    Dyspnea- no Medications: Compliance- yes L Lightheadedness,Syncope- no    Edema- no  DIABETES: Disease Monitoring: Blood Sugar ranges-80-140  Polyuria/phagia/dipsia- no       Visual problems- no; last Ophth exam 8/12 : no retinopathy Foot care: no Medications: Compliance- yes Hypoglycemic symptoms- no  HYPERLIPIDEMIA: Disease Monitoring: See symptoms for Hypertension Medications: Compliance-yes  Abd pain, bowel changes- no   Muscle aches- no but see below  Past medical history/family history/social history were all reviewed and updated. Pertinent data: Past medical history carpal tunnel syndrome; no surgery to date. Both parents had degenerative joint disease            Review of Systems she has no myalgias, but she's had decreased range of motion of the right shoulder the last several months. She denies any injury or trigger for this. There is no associated change in temperature, color or swelling. She is not taking any treatment for this     Objective:   Physical Exam Gen.:  well-nourished in appearance. Alert, appropriate and cooperative throughout exam. Eyes: No corneal or conjunctival inflammation noted.  Prominent globes. Mouth: Oral mucosa and oropharynx reveal no lesions or exudates. Teeth in good repair. Neck: No deformities, masses, or tenderness noted. Range of motion & Thyroid normal Lungs: Normal respiratory effort; chest expands symmetrically. Lungs are clear to auscultation without rales, wheezes, or increased work of breathing. Heart: Normal rate and rhythm. Normal S1 and S2. No gallop, click, or rub.S4 w/o murmur. Abdomen: Bowel sounds normal; abdomen soft and nontender. No masses, organomegaly or hernias  noted.  Musculoskeletal/extremities: No deformity or scoliosis noted of  the thoracic or lumbar spine. No clubbing, cyanosis, edema, or deformity noted. Range of motion  decreased at the right shoulder. Pain with rotation superiorly of the right shoulder. Mild crepitus of the knees without effusion .Tone & strength  normal.Joints normal. Nail health  good. Vascular: Carotid, radial artery, dorsalis pedis and  posterior tibial pulses are full and equal. No bruits present. Neurologic: Alert and oriented x3. Deep tendon reflexes symmetrical and normal  . Light touch normal over the feet decreased arches present.       Skin: Intact without suspicious lesions or rashes. Lymph: No cervical, axillary lymphadenopathy present. Psych: Mood and affect are normal. Normally interactive                                                                                       Assessment & Plan:   #1 see problem list with assessment and recommendations  #2 right shoulder pain, impingement syndrome without suggestion of rotator cuff tear. Physical therapy will be offered; if no better orthopedic consultation. History she has GI intolerance to shellfish; glucosamine is not an option

## 2012-03-18 NOTE — Patient Instructions (Signed)
The most common cause of elevated triglycerides is the ingestion of sugar from high fructose corn syrup sources added to processed foods & drinks.  Eat a low-fat diet with lots of fruits and vegetables, up to 7-9 servings per day. Consume less than 30 (preferably ZERO) grams of sugar per day from foods & drinks with High Fructose Corn Syrup (HFCS) sugar as #1,2,3 or # 4 on label.Whole Foods, [employer] & [employer] do not carry products with HFCS. Follow a  low carb nutrition program such as [place] or [employer]  to prevent Diabetes progression . White carbohydrates (potatoes, rice, bread, and pasta) have a high spike of sugar and a high load of sugar. For example a  baked potato has a cup of sugar and a  french fry  2 teaspoons of sugar. Yams, wild  rice, whole grained bread &  wheat pasta have been much lower spike and load of  sugar. Portions should be the size of a deck of cards or your palm.  Please  schedule fasting Labs in 6 months : BMET,Lipids, hepatic panel, CBC & dif, TSH, A1c , microalbumin. PLEASE BRING THESE INSTRUCTIONS TO FOLLOW UP  LAB APPOINTMENT.This will guarantee correct labs are drawn, eliminating need for repeat blood sampling ( needle sticks ! ). Diagnoses /Codes: 250.00, 272.4,995.20 .

## 2012-03-31 ENCOUNTER — Ambulatory Visit: Payer: BC Managed Care – PPO | Attending: Internal Medicine | Admitting: Physical Therapy

## 2012-03-31 DIAGNOSIS — M25519 Pain in unspecified shoulder: Secondary | ICD-10-CM | POA: Insufficient documentation

## 2012-03-31 DIAGNOSIS — IMO0001 Reserved for inherently not codable concepts without codable children: Secondary | ICD-10-CM | POA: Insufficient documentation

## 2012-04-07 ENCOUNTER — Ambulatory Visit: Payer: BC Managed Care – PPO | Admitting: Physical Therapy

## 2012-04-14 ENCOUNTER — Ambulatory Visit: Payer: BC Managed Care – PPO | Admitting: Physical Therapy

## 2012-04-19 ENCOUNTER — Encounter: Payer: BC Managed Care – PPO | Admitting: Physical Therapy

## 2012-04-23 ENCOUNTER — Ambulatory Visit: Payer: BC Managed Care – PPO | Attending: Internal Medicine | Admitting: Physical Therapy

## 2012-04-23 DIAGNOSIS — M25519 Pain in unspecified shoulder: Secondary | ICD-10-CM | POA: Insufficient documentation

## 2012-04-23 DIAGNOSIS — IMO0001 Reserved for inherently not codable concepts without codable children: Secondary | ICD-10-CM | POA: Insufficient documentation

## 2012-04-23 DIAGNOSIS — M25619 Stiffness of unspecified shoulder, not elsewhere classified: Secondary | ICD-10-CM | POA: Insufficient documentation

## 2012-04-26 ENCOUNTER — Ambulatory Visit: Payer: BC Managed Care – PPO | Admitting: Physical Therapy

## 2012-05-05 ENCOUNTER — Ambulatory Visit: Payer: BC Managed Care – PPO | Admitting: Physical Therapy

## 2012-05-11 ENCOUNTER — Ambulatory Visit: Payer: BC Managed Care – PPO | Admitting: Physical Therapy

## 2012-05-25 ENCOUNTER — Other Ambulatory Visit: Payer: Self-pay | Admitting: Internal Medicine

## 2012-05-25 DIAGNOSIS — Z1231 Encounter for screening mammogram for malignant neoplasm of breast: Secondary | ICD-10-CM

## 2012-06-28 ENCOUNTER — Ambulatory Visit
Admission: RE | Admit: 2012-06-28 | Discharge: 2012-06-28 | Disposition: A | Discharge status: Home / Self Care | Payer: BC Managed Care – PPO | Source: Ambulatory Visit | Attending: Internal Medicine | Admitting: Internal Medicine

## 2012-06-28 DIAGNOSIS — Z1231 Encounter for screening mammogram for malignant neoplasm of breast: Secondary | ICD-10-CM

## 2012-09-23 ENCOUNTER — Other Ambulatory Visit (INDEPENDENT_AMBULATORY_CARE_PROVIDER_SITE_OTHER): Payer: BC Managed Care – PPO

## 2012-09-23 DIAGNOSIS — E119 Type 2 diabetes mellitus without complications: Secondary | ICD-10-CM

## 2012-09-23 DIAGNOSIS — E785 Hyperlipidemia, unspecified: Secondary | ICD-10-CM

## 2012-09-23 DIAGNOSIS — T887XXA Unspecified adverse effect of drug or medicament, initial encounter: Secondary | ICD-10-CM

## 2012-09-23 LAB — LIPID PANEL
LDL Cholesterol: 130 mg/dL — ABNORMAL HIGH (ref 0–99)
Total CHOL/HDL Ratio: 4
VLDL: 13.2 mg/dL (ref 0.0–40.0)

## 2012-09-23 LAB — CBC WITH DIFFERENTIAL/PLATELET
Basophils Relative: 0.5 % (ref 0.0–3.0)
Eosinophils Absolute: 0.1 10*3/uL (ref 0.0–0.7)
HCT: 37.5 % (ref 36.0–46.0)
Hemoglobin: 12.2 g/dL (ref 12.0–15.0)
Lymphocytes Relative: 51.4 % — ABNORMAL HIGH (ref 12.0–46.0)
Lymphs Abs: 2.4 10*3/uL (ref 0.7–4.0)
MCHC: 32.5 g/dL (ref 30.0–36.0)
MCV: 95.1 fl (ref 78.0–100.0)
Monocytes Absolute: 0.3 10*3/uL (ref 0.1–1.0)
Neutro Abs: 1.9 10*3/uL (ref 1.4–7.7)
RBC: 3.94 Mil/uL (ref 3.87–5.11)

## 2012-09-23 LAB — BASIC METABOLIC PANEL
BUN: 8 mg/dL (ref 6–23)
Chloride: 104 mEq/L (ref 96–112)
Creatinine, Ser: 0.6 mg/dL (ref 0.4–1.2)
Glucose, Bld: 99 mg/dL (ref 70–99)
Potassium: 3.5 mEq/L (ref 3.5–5.1)

## 2012-09-23 LAB — HEPATIC FUNCTION PANEL
Bilirubin, Direct: 0.1 mg/dL (ref 0.0–0.3)
Total Bilirubin: 0.6 mg/dL (ref 0.3–1.2)

## 2012-09-30 ENCOUNTER — Encounter: Payer: Self-pay | Admitting: Internal Medicine

## 2012-09-30 ENCOUNTER — Ambulatory Visit (INDEPENDENT_AMBULATORY_CARE_PROVIDER_SITE_OTHER): Payer: BC Managed Care – PPO | Admitting: Internal Medicine

## 2012-09-30 VITALS — BP 130/82 | HR 108 | Wt 174.2 lb

## 2012-09-30 DIAGNOSIS — E785 Hyperlipidemia, unspecified: Secondary | ICD-10-CM

## 2012-09-30 DIAGNOSIS — R030 Elevated blood-pressure reading, without diagnosis of hypertension: Secondary | ICD-10-CM

## 2012-09-30 DIAGNOSIS — E119 Type 2 diabetes mellitus without complications: Secondary | ICD-10-CM

## 2012-09-30 MED ORDER — RAMIPRIL 2.5 MG PO CAPS: ORAL_CAPSULE | ORAL | Status: DC

## 2012-09-30 MED ORDER — METFORMIN HCL ER 500 MG PO TB24: 500.0000 mg | ORAL_TABLET | Freq: Two times a day (BID) | ORAL | Status: DC

## 2012-09-30 MED ORDER — ATORVASTATIN CALCIUM 20 MG PO TABS: ORAL_TABLET | ORAL | Status: DC

## 2012-09-30 NOTE — Patient Instructions (Addendum)
A1c assesses average 24 hour  glucose over prior 6-12 weeks. A1c GOALS:  Good diabetic control: 6.5-7 %  Fair diabetic control: 7-8 %  Poor diabetic control: greater than 8 % ( except with additional factors such as  advanced age; significant coronary or neurologic disease,etc). Check the A1c in 6 months . Goals for home glucose monitoring are : fasting  or morning glucose goal of  100-150. Two hours after any meal , goal = < 180, preferably < 160. Report any low blood glucoses immediately. Urine microalbumin assesses  microscopic kidney disease from hypertension or Diabetes; Diabetes & Hypertension control will reverse any elevation. Check this in 6 months. Please  schedule fasting Labs in 6 mos : BMET,Lipids, hepatic panel, A1c, urine microalbumin.PLEASE BRING THESE INSTRUCTIONS TO FOLLOW UP  LAB APPOINTMENT.This will guarantee correct labs are drawn, eliminating need for repeat blood sampling ( needle sticks ! ). Diagnoses /Codes: 250.00,272.4,995.20.  If you activate My Chart; the results can be released to you as soon as they populate from the lab. If you choose not to use this program; the labs have to be reviewed, copied & mailed   causing a delay in getting the results to you.

## 2012-09-30 NOTE — Progress Notes (Signed)
Subjective:    Patient ID: Sonia Little, female    DOB: October 17, 1966, 46 y.o.   MRN: [id number]  HPI The patient is here for followup of diabetes, hyperlipidemia, and BP status  The most recent A1c  09/23/12 was 6.5%  , which correlates to an average sugar of 140  , and long-term risk of 30% . Fasting blood sugar ranges  105-120 .  Highest two-hour postprandial glucose is  < 180; range 140-160 No hypoglycemia Last ophthalmologic examination August 2013  revealed no retinopathy. No active podiatry assessment on record. Diet is -low carb with recent increased intake of red meat Exercise - Bicycle 3-4/week for 30 min  The most recent lipids   reveal LDL 130  , HDL-41  , and triglycerides  66 . There is medical compliance with the statin- yes- Lipitor 10mg   Blood pressure range  or average is   . There is medical compliance with antihypertensive medications (low dose Ramipril)  She reports increasing her metformin 500mg  BID about 2-3 months ago because her fasting blood sugar used to range 115-130 and 2-hour postprandial blood sugar used to be greater than 180.       Review of Systems Constitutional: No fever, chills, significant weight change, fatigue, weakness or night sweats Eyes: No  blurred vision, double vision, or loss of vision Cardiovascular: no chest pain, palpitations, racing, irregular rhythm,syncope,nausea,sweating, claudication, or edema  Respiratory: No exertinal dyspnea, paroxysmal nocturnal dyspnea Gastrointestinal: No heartburn,dysphagia, diarrhea, significant constipation, rectal bleeding, melena Genitourinary: No dysuria,hematuria, pyuria, frequency,  incontinence, nocturia Musculoskeletal: No myalgias or muscle cramping , weakness, or cyanosis Dermatologic: No rash, pruritus, urticaria, or change in color or temperature of skin Neurologic: No  limb weakness,  numbness or tingling Endocrine: No change in hair/skin/ nails, excessive thirst, excessive hunger, excessive  urination, or unexplained fatigue      Objective:   Physical Exam Gen.: Healthy and well-nourished in appearance. Alert, appropriate and cooperative throughout exam. Eyes: No corneal or conjunctival inflammation noted.Prominent globes Nose: External nasal exam reveals no deformity or inflammation. Nasal mucosa are pink and moist. No lesions or exudates noted.  Mouth: Oral mucosa and oropharynx reveal no lesions or exudates. Teeth in good repair. Neck: No deformities, masses, or tenderness noted. Well developed SCM muscles. Thyroid normal. Lungs: Normal respiratory effort; chest expands symmetrically. Lungs are clear to auscultation without rales, wheezes, or increased work of breathing. Heart: Normal rate and rhythm. Normal S1 and S2. No gallop, click, or rub. Grade 1/2-1 over  6 systolic murmur  Abdomen: Bowel sounds normal; abdomen soft and nontender. No masses, organomegaly or hernias noted.                                                   Musculoskeletal/extremities:  No clubbing, cyanosis, edema, or deformity noted. Tone & strength  normal.Joints normal. Nail health  good. Vascular: Carotid, radial artery, dorsalis pedis and  posterior tibial pulses are full and equal. No bruits present. Neurologic: Alert and oriented x3. Deep tendon reflexes symmetrical and normal.  Light touch normal over feet.         Skin: Intact without suspicious lesions or rashes. Lymph: No cervical, axillary lymphadenopathy present. Psych: Mood and affect are normal. Normally interactive  Assessment & Plan:  1). DM- recent A1C 6.5%. Continue to take metformin 500mg  BID. Continue to follow with yearly eye exam.   2). Hypercholestermia- Increase  Lipitor to 20mg . Continue to perform physical activity. Encouraged to decrease intake of red meat.

## 2012-09-30 NOTE — Assessment & Plan Note (Signed)
Blood pressure is well controlled; low-dose ramipril will be continued for renal protection

## 2012-09-30 NOTE — Assessment & Plan Note (Signed)
LDL is not at minimal goal of less than 100. Risk is increased approximately 40%. Atorvastatin will be increased to 20 mg daily. Nutritional intervention discussed. Please review Dr Gildardo Griffes book Eat, Drink & Be Healthy for dietary cholesterol information.  Options  to raise HDL or GOOD cholesterol discussed : exercising 30-45 minutes 3-4 X per week; including salmon & tuna in the diet;  & supplementing with Omega 3 fatty acids (Flax or Fish oil )  1-2 grams per day. The B vitamin Niacin also raises HDL but has a vasodilating effect which may cause flushing.

## 2012-09-30 NOTE — Assessment & Plan Note (Signed)
A1c is at goal without hypoglycemia; no change recommended. Recheck in 6 months

## 2013-01-01 ENCOUNTER — Other Ambulatory Visit: Payer: Self-pay

## 2013-03-23 ENCOUNTER — Other Ambulatory Visit (INDEPENDENT_AMBULATORY_CARE_PROVIDER_SITE_OTHER): Payer: BC Managed Care – PPO

## 2013-03-23 DIAGNOSIS — E119 Type 2 diabetes mellitus without complications: Secondary | ICD-10-CM

## 2013-03-23 DIAGNOSIS — T887XXA Unspecified adverse effect of drug or medicament, initial encounter: Secondary | ICD-10-CM

## 2013-03-23 DIAGNOSIS — E785 Hyperlipidemia, unspecified: Secondary | ICD-10-CM

## 2013-03-23 LAB — HEPATIC FUNCTION PANEL
AST: 12 U/L (ref 0–37)
Total Bilirubin: 1 mg/dL (ref 0.3–1.2)

## 2013-03-23 LAB — CBC WITH DIFFERENTIAL/PLATELET
Basophils Relative: 0.4 % (ref 0.0–3.0)
Eosinophils Relative: 1.3 % (ref 0.0–5.0)
HCT: 38.6 % (ref 36.0–46.0)
Lymphs Abs: 2.4 10*3/uL (ref 0.7–4.0)
MCV: 94.5 fl (ref 78.0–100.0)
Monocytes Absolute: 0.3 10*3/uL (ref 0.1–1.0)
Neutro Abs: 3.2 10*3/uL (ref 1.4–7.7)
Platelets: 323 10*3/uL (ref 150.0–400.0)
RBC: 4.09 Mil/uL (ref 3.87–5.11)
WBC: 6 10*3/uL (ref 4.5–10.5)

## 2013-03-23 LAB — LIPID PANEL
HDL: 47.7 mg/dL (ref >39.00)
Total CHOL/HDL Ratio: 3

## 2013-03-23 LAB — BASIC METABOLIC PANEL
CO2: 27 mEq/L (ref 19–32)
Calcium: 8.7 mg/dL (ref 8.4–10.5)
Glucose, Bld: 105 mg/dL — ABNORMAL HIGH (ref 70–99)
Sodium: 135 mEq/L (ref 135–145)

## 2013-03-30 ENCOUNTER — Ambulatory Visit (INDEPENDENT_AMBULATORY_CARE_PROVIDER_SITE_OTHER): Payer: BC Managed Care – PPO | Admitting: Internal Medicine

## 2013-03-30 ENCOUNTER — Encounter: Payer: Self-pay | Admitting: Internal Medicine

## 2013-03-30 VITALS — BP 126/78 | HR 88 | Wt 172.4 lb

## 2013-03-30 DIAGNOSIS — E785 Hyperlipidemia, unspecified: Secondary | ICD-10-CM

## 2013-03-30 DIAGNOSIS — E876 Hypokalemia: Secondary | ICD-10-CM | POA: Insufficient documentation

## 2013-03-30 DIAGNOSIS — E119 Type 2 diabetes mellitus without complications: Secondary | ICD-10-CM

## 2013-03-30 DIAGNOSIS — R030 Elevated blood-pressure reading, without diagnosis of hypertension: Secondary | ICD-10-CM

## 2013-03-30 MED ORDER — RAMIPRIL 2.5 MG PO CAPS: ORAL_CAPSULE | ORAL | Status: DC

## 2013-03-30 MED ORDER — METFORMIN HCL ER 500 MG PO TB24: 500.0000 mg | ORAL_TABLET | Freq: Two times a day (BID) | ORAL | Status: DC

## 2013-03-30 MED ORDER — ATORVASTATIN CALCIUM 20 MG PO TABS: ORAL_TABLET | ORAL | Status: DC

## 2013-03-30 NOTE — Assessment & Plan Note (Signed)
TSH is excellent. She'll continue to employ the wrist splints at night as needed

## 2013-03-30 NOTE — Patient Instructions (Addendum)
Please  Schedule non fasting Labs in 12-16 weeks after nutrition changes: urine microalbumin, A1c. PLEASE BRING THESE INSTRUCTIONS TO FOLLOW UP  LAB APPOINTMENT.This will guarantee correct labs are drawn, eliminating need for repeat blood sampling ( needle sticks ! ). Diagnoses /Codes: 250.00

## 2013-03-30 NOTE — Progress Notes (Signed)
Subjective:    Patient ID: Sonia Little, female    DOB: [birth year], 47 y.o.   MRN: [id number]  HPI The patient is here for followup of diabetes, hyperlipidemia, and PMH of elevated BP w/o hypertension.  The most recent A1c was 6.8 %  , which correlates to an average sugar of 148  , and long-term risk of 36% . Fasting blood sugar range 102-140 .  Highest two-hour postprandial glucose is rarely up to 180-190, but usually <  160  . No hypoglycemia reported Last ophthalmologic examination 8/13  revealed no retinopathy. No active podiatry assessment on record. Diet is low fat,low carb sugar .With tax season diet "off". Exercise as bike 3-4 X/ week  for 30 minutes.  The most recent lipids   reveal LDL 92 ; HDL 48 ; and triglycerides  71  . There is medical compliance with the statin.  Blood pressure not monitored  . There is medical  compliance with ACE-I mainly for nephroprotection  No  medication adverse effects suggested; specifically no cough or angioedema       Review of Systems Constitutional: No  significant weight change;  excess fatigue Eyes: No  blurred vision;  double vision ; loss of vision Cardiovascular: no chest pain ;palpitations; racing; irregular rhythm ;syncope; claudication ; edema  Respiratory: No exertional dyspnea;  paroxysmal nocturnal dyspnea Genitourinary: No dysuria; hematuria ; pyuria; frequency;  Incontinence;  nocturia Musculoskeletal: No myalgias or muscle cramping  Dermatologic: No non healing  Lesions; change in color or temperature of skin Neurologic: No  limb weakness;  numbness or tingling;or  burning except occasional CTS in hands, responsive to braces worn as needed Endocrine: No change in hair, skin, nails. No excessive thirst; excessive hunger;  excessive urination      Objective:   Physical Exam Gen.: Healthy and well-nourished in appearance. Alert, appropriate and cooperative throughout exam.Appears younger than stated age  Head:  Normocephalic without obvious abnormalities  Eyes: No corneal or conjunctival inflammation noted. Prominent globes.Slight lid lag Mouth: Oral mucosa and oropharynx reveal no lesions or exudates. Teeth in good repair. Neck: No deformities, masses, or tenderness noted.  Thyroid normal. Lungs: Normal respiratory effort; chest expands symmetrically. Lungs are clear to auscultation without rales, wheezes, or increased work of breathing. Heart: Normal rate and rhythm. Normal S1 and S2. No gallop, click, or rub. Grade 1/6 systolic murmur. Abdomen: Bowel sounds normal; abdomen soft and nontender. No masses, organomegaly or hernias noted.                              Musculoskeletal/extremities: No clubbing, cyanosis, edema, or significant extremity  deformity noted. Range of motion normal .Tone & strength  Normal. Joints normal . Nail health good. Pes planus Able to lie down & sit up w/o help.  Vascular: Carotid, radial artery, dorsalis pedis and  posterior tibial pulses are full and equal. No bruits present. Neurologic: Alert and oriented x3. Deep tendon reflexes symmetrical but 0-1/2+ @ knees.  Light touch normal over feet. Tinel's testing negative bilaterally.         Skin: Intact without suspicious lesions or rashes. Lymph: No cervical, axillary lymphadenopathy present. Psych: Mood and affect are normal. Normally interactive  Assessment & Plan:  See Current Assessment & Plan in Problem List under specific Diagnosis

## 2013-03-30 NOTE — Assessment & Plan Note (Addendum)
To increase  Potassium (K+) increase citrus fruits & bananas in diet and use the salt substitute No Salt, which contains  potassium , to season food @ the table. Drink Gatorade Lite instead of water if having excessive sweating. This will prevent loss of essential  Electrolytes.Recheck K+ after 12-16 weeks .

## 2013-03-30 NOTE — Assessment & Plan Note (Signed)
Renal function is excellent; no adverse effects from ACE inhibitor. No change indicated

## 2013-03-30 NOTE — Assessment & Plan Note (Addendum)
A1c is at goal of less than 7% despite suboptimal nutritional compliance due to work stress issues. She plans to make nutritional interventions. A1c and urine microalbumin are recommended in 12-16 weeks after such changes. Medications will not be increased or adjusted

## 2013-03-30 NOTE — Assessment & Plan Note (Signed)
Interventions to raise HDL or GOOD cholesterol include: exercising 30-45 minutes 3-4 X per week; including salmon & tuna in the diet;  & supplementing with Omega 3 fatty acids (Flax Seed or Fish oil )  1-2 grams per day. The B vitamin Niacin also raises HDL but has a vasodilating effect which may cause flushing.To date extensive trials have NOT proven benefit of attempting to raise HDL with Niacin.

## 2013-06-06 ENCOUNTER — Other Ambulatory Visit: Payer: Self-pay

## 2013-06-06 DIAGNOSIS — Z1231 Encounter for screening mammogram for malignant neoplasm of breast: Secondary | ICD-10-CM

## 2013-06-29 ENCOUNTER — Ambulatory Visit: Payer: BC Managed Care – PPO

## 2013-07-08 ENCOUNTER — Ambulatory Visit
Admission: RE | Admit: 2013-07-08 | Discharge: 2013-07-08 | Disposition: A | Discharge status: Home / Self Care | Payer: BC Managed Care – PPO | Source: Ambulatory Visit

## 2013-07-08 DIAGNOSIS — Z1231 Encounter for screening mammogram for malignant neoplasm of breast: Secondary | ICD-10-CM

## 2013-08-01 ENCOUNTER — Other Ambulatory Visit: Payer: BC Managed Care – PPO

## 2013-08-02 ENCOUNTER — Telehealth: Payer: Self-pay | Admitting: Internal Medicine

## 2013-08-02 ENCOUNTER — Other Ambulatory Visit (INDEPENDENT_AMBULATORY_CARE_PROVIDER_SITE_OTHER): Payer: BC Managed Care – PPO

## 2013-08-02 DIAGNOSIS — E119 Type 2 diabetes mellitus without complications: Secondary | ICD-10-CM

## 2013-08-02 DIAGNOSIS — E876 Hypokalemia: Secondary | ICD-10-CM

## 2013-08-02 LAB — HEMOGLOBIN A1C: Hgb A1c MFr Bld: 7 % — ABNORMAL HIGH (ref 4.6–6.5)

## 2013-08-02 LAB — MICROALBUMIN / CREATININE URINE RATIO
Creatinine,U: 176.7 mg/dL
Microalb Creat Ratio: 0.6 mg/g (ref 0.0–30.0)

## 2013-08-02 NOTE — Telephone Encounter (Signed)
08/02/2013  Pt came in for labs today and wanted to know if she is supposed to make an appt to see Dr. Alwyn Ren.  Please advise.  bw

## 2013-08-04 ENCOUNTER — Telehealth: Payer: Self-pay | Admitting: *Deleted

## 2013-08-04 NOTE — Telephone Encounter (Signed)
Dr. Alwyn Ren notified. Agrees to see pt for an follow-up appointment. Please schedule. DJR

## 2013-08-08 NOTE — Telephone Encounter (Signed)
Spoke with the pt and she stated that she has scheduled an appt with Dr. Alwyn Ren for Nov.//AB/CMA

## 2013-08-25 ENCOUNTER — Encounter: Payer: Self-pay | Admitting: Internal Medicine

## 2013-09-20 ENCOUNTER — Encounter: Payer: Self-pay | Admitting: Internal Medicine

## 2013-09-20 ENCOUNTER — Ambulatory Visit (INDEPENDENT_AMBULATORY_CARE_PROVIDER_SITE_OTHER): Payer: BC Managed Care – PPO | Admitting: Internal Medicine

## 2013-09-20 VITALS — BP 128/78 | HR 98 | Temp 98.5°F | Resp 12

## 2013-09-20 DIAGNOSIS — E876 Hypokalemia: Secondary | ICD-10-CM

## 2013-09-20 DIAGNOSIS — Z23 Encounter for immunization: Secondary | ICD-10-CM

## 2013-09-20 DIAGNOSIS — E119 Type 2 diabetes mellitus without complications: Secondary | ICD-10-CM

## 2013-09-20 DIAGNOSIS — R030 Elevated blood-pressure reading, without diagnosis of hypertension: Secondary | ICD-10-CM

## 2013-09-20 NOTE — Progress Notes (Signed)
Subjective:    Patient ID: Sonia Little, female    DOB: 12-22-1965, 47 y.o.   MRN: [id number]  HPI Diabetes status assessment: Fasting or morning glucose range 107-136. Highest glucose 2 hours after any meal is < 220. No hypoglycemia reported                                                                                                                 No regular exercise described as bike 3 times per week for 30 minutes. Low carb nutrition/diet followed Medication compliance is good. No medication adverse effects noted. Eye exam current; no retinopathy. Foot care not current  A1c/ urine microalbumin monitor  7%/1.1 on 9/16 (average sugar 172 with long-term 40%  risk ).  Her LDL was 92; HDL 47.7; and triglycerides 71 on Mar 23, 2013. Kidney function was normal.  Hypokalemia has been corrected. She is not monitoring blood pressure; she has had mildly elevated blood pressure in the past without diagnosis of hypertension.          Review of Systems No excess thirst ;  excess hunger ; or excess urination reported                              No lightheadedness with standing reported No chest pain ; palpitations ; claudication described .                                                                                                                             No non healing skin  ulcers or sores of extremities noted. No numbness or tingling or burning in feet described  No significant change in weight . No blurred,double, or loss of vision reported  .      Objective:   Physical Exam Gen.: Healthy and well-nourished in appearance. Alert, appropriate and cooperative throughout exam.Appears younger than stated age   Eyes: No corneal or conjunctival inflammation noted. Prominent globes No lesions or exudates noted.  Mouth: Oral mucosa and  oropharynx reveal no lesions or exudates. Teeth in good repair. Neck: No deformities, masses, or tenderness noted.  Thyroid normal ; prominent larynx. Lungs: Normal respiratory effort; chest expands symmetrically. Lungs are clear to auscultation without rales, wheezes, or increased work of breathing. Heart: Normal rate and rhythm. Normal S1 and S2. No gallop, click, or rub.S4 w/o murmur. Abdomen: Bowel sounds normal; abdomen soft and nontender. No masses, organomegaly or hernias noted.                                  Musculoskeletal/extremities:  No clubbing, cyanosis, edema, or significant extremity  deformity noted. Tone & strength normal. Hand joints normal . Fingernail / toenail health good. Able to lie down & sit up w/o help.  Vascular: Carotid, radial artery, dorsalis pedis and  posterior tibial pulses are full and equal. No bruits present. Neurologic: Alert and oriented x3. Deep tendon reflexes symmetrical and 1/2+  Light touch normal      Skin: Intact without suspicious lesions or rashes. Lymph: No cervical, axillary lymphadenopathy present. Psych: Mood and affect are normal. Normally interactive                                                                                        Assessment & Plan:  See Current Assessment & Plan in Problem List under specific Diagnosis

## 2013-09-20 NOTE — Assessment & Plan Note (Signed)
Diabetic control is adequate; A1c should be repeated in 4 months. If A1c rises above 7%; an agent to increase glucose secretion by the kidneys would be pursued. There is no evidence of macro or microvascular kidney disease or neuropathy

## 2013-09-20 NOTE — Assessment & Plan Note (Signed)
Hypokalemia has been corrected. Interventions to increase potassium discussed.

## 2013-09-20 NOTE — Patient Instructions (Addendum)
To increase  Potassium (K+) increase citrus fruits & bananas in diet and use the salt substitute No Salt, which contains  potassium , to season food @ the table.  Minimal Blood Pressure Goal= AVERAGE < 140/90;  Ideal is an AVERAGE < 135/85. This AVERAGE should be calculated from @ least 3-5 BP readings taken @ different times of day on different days of week if possible. You should not respond to isolated BP readings , but rather the AVERAGE for that week .BP can also be checked against the blood pressure device at the pharmacy. Finger or wrist cuffs are not dependable; an arm cuff is.

## 2013-09-20 NOTE — Assessment & Plan Note (Signed)
   Minimal Blood Pressure Goal= AVERAGE < 140/90;  Ideal is an AVERAGE < 135/85. This AVERAGE should be calculated from @ least 5-7 BP readings taken @ different times of day on different days of week. You should not respond to isolated BP readings , but rather the AVERAGE for that week .BP can also be checked  at the pharmacy.

## 2013-10-04 ENCOUNTER — Other Ambulatory Visit: Payer: Self-pay | Admitting: Internal Medicine

## 2013-10-04 NOTE — Telephone Encounter (Signed)
Metformin refilled per protocol 

## 2014-01-18 ENCOUNTER — Other Ambulatory Visit (INDEPENDENT_AMBULATORY_CARE_PROVIDER_SITE_OTHER): Payer: BC Managed Care – PPO

## 2014-01-18 DIAGNOSIS — E876 Hypokalemia: Secondary | ICD-10-CM

## 2014-01-18 DIAGNOSIS — R030 Elevated blood-pressure reading, without diagnosis of hypertension: Secondary | ICD-10-CM

## 2014-01-18 DIAGNOSIS — E119 Type 2 diabetes mellitus without complications: Secondary | ICD-10-CM

## 2014-01-18 LAB — BASIC METABOLIC PANEL
BUN: 9 mg/dL (ref 6–23)
CHLORIDE: 102 meq/L (ref 96–112)
CO2: 27 meq/L (ref 19–32)
CREATININE: 0.6 mg/dL (ref 0.4–1.2)
Calcium: 9.2 mg/dL (ref 8.4–10.5)
GFR: 138.01 mL/min (ref >60.00)
GLUCOSE: 92 mg/dL (ref 70–99)
POTASSIUM: 3.8 meq/L (ref 3.5–5.1)
Sodium: 138 mEq/L (ref 135–145)

## 2014-01-18 LAB — HEMOGLOBIN A1C: Hgb A1c MFr Bld: 7 % — ABNORMAL HIGH (ref 4.6–6.5)

## 2014-03-29 ENCOUNTER — Other Ambulatory Visit: Payer: Self-pay

## 2014-03-29 MED ORDER — METFORMIN HCL ER 500 MG PO TB24: ORAL_TABLET | ORAL | Status: DC

## 2014-04-11 ENCOUNTER — Other Ambulatory Visit: Payer: Self-pay

## 2014-04-11 DIAGNOSIS — E785 Hyperlipidemia, unspecified: Secondary | ICD-10-CM

## 2014-04-11 MED ORDER — ATORVASTATIN CALCIUM 20 MG PO TABS: ORAL_TABLET | ORAL | Status: DC

## 2014-05-18 ENCOUNTER — Other Ambulatory Visit: Payer: Self-pay

## 2014-05-18 DIAGNOSIS — R030 Elevated blood-pressure reading, without diagnosis of hypertension: Secondary | ICD-10-CM

## 2014-05-18 DIAGNOSIS — E119 Type 2 diabetes mellitus without complications: Secondary | ICD-10-CM

## 2014-05-18 MED ORDER — RAMIPRIL 2.5 MG PO CAPS: ORAL_CAPSULE | ORAL | Status: DC

## 2014-06-17 LAB — HM MAMMOGRAPHY

## 2014-06-22 ENCOUNTER — Other Ambulatory Visit: Payer: Self-pay

## 2014-06-22 DIAGNOSIS — Z1231 Encounter for screening mammogram for malignant neoplasm of breast: Secondary | ICD-10-CM

## 2014-07-11 ENCOUNTER — Ambulatory Visit
Admission: RE | Admit: 2014-07-11 | Discharge: 2014-07-11 | Disposition: A | Discharge status: Home / Self Care | Payer: BC Managed Care – PPO | Source: Ambulatory Visit

## 2014-07-11 DIAGNOSIS — Z1231 Encounter for screening mammogram for malignant neoplasm of breast: Secondary | ICD-10-CM

## 2014-07-18 LAB — HM DIABETES EYE EXAM

## 2014-07-27 ENCOUNTER — Other Ambulatory Visit: Payer: Self-pay | Admitting: Gynecology

## 2014-07-28 LAB — CYTOLOGY - PAP

## 2014-08-16 LAB — HM PAP SMEAR: HM Pap smear: NORMAL

## 2014-08-21 ENCOUNTER — Other Ambulatory Visit: Payer: Self-pay | Admitting: Gynecology

## 2014-09-15 ENCOUNTER — Ambulatory Visit (HOSPITAL_BASED_OUTPATIENT_CLINIC_OR_DEPARTMENT_OTHER)
Admission: RE | Admit: 2014-09-15 | Discharge: 2014-09-15 | Disposition: A | Discharge status: Home / Self Care | Payer: BC Managed Care – PPO | Source: Ambulatory Visit | Attending: Family Medicine | Admitting: Family Medicine

## 2014-09-15 ENCOUNTER — Ambulatory Visit (INDEPENDENT_AMBULATORY_CARE_PROVIDER_SITE_OTHER): Payer: BC Managed Care – PPO | Admitting: Family Medicine

## 2014-09-15 ENCOUNTER — Encounter: Payer: Self-pay | Admitting: Family Medicine

## 2014-09-15 VITALS — BP 120/76 | HR 103 | Temp 97.6°F | Resp 16 | Ht 61.5 in | Wt 175.1 lb

## 2014-09-15 DIAGNOSIS — E049 Nontoxic goiter, unspecified: Secondary | ICD-10-CM

## 2014-09-15 DIAGNOSIS — E1169 Type 2 diabetes mellitus with other specified complication: Secondary | ICD-10-CM

## 2014-09-15 DIAGNOSIS — E785 Hyperlipidemia, unspecified: Secondary | ICD-10-CM

## 2014-09-15 DIAGNOSIS — E010 Iodine-deficiency related diffuse (endemic) goiter: Secondary | ICD-10-CM

## 2014-09-15 DIAGNOSIS — E119 Type 2 diabetes mellitus without complications: Secondary | ICD-10-CM

## 2014-09-15 DIAGNOSIS — R030 Elevated blood-pressure reading, without diagnosis of hypertension: Secondary | ICD-10-CM

## 2014-09-15 MED ORDER — METFORMIN HCL ER 500 MG PO TB24: ORAL_TABLET | ORAL | Status: DC

## 2014-09-15 MED ORDER — ATORVASTATIN CALCIUM 20 MG PO TABS: ORAL_TABLET | ORAL | Status: DC

## 2014-09-15 MED ORDER — RAMIPRIL 2.5 MG PO CAPS: ORAL_CAPSULE | ORAL | Status: DC

## 2014-09-15 NOTE — Progress Notes (Signed)
Pre visit review using our clinic review tool, if applicable. No additional management support is needed unless otherwise documented below in the visit note. 

## 2014-09-15 NOTE — Patient Instructions (Signed)
Schedule your complete physical in 4 months We'll notify you of your lab results and make any changes if needed We'll call you with your ultrasound appt Keep up the good work on healthy diet and regular exercise- you're doing great! Call with any questions or concerns Welcome!  We're glad to have you!!!

## 2014-09-15 NOTE — Progress Notes (Signed)
   Subjective:    Patient ID: Sonia Little, female    DOB: 07-Feb-1966, 48 y.o.   MRN: [id number]  HPI New to establish.  Previous MD- Linna Darner  DM- chronic problem, on Metformin.  On ACE for renal protection.  UTD on eye exam (9/25)- no retinopathy.  CBGs have been elevated in the AM intermittently, usually 110s but will occasionally rise to 130-140s.  After dinner #s 140-170.  No CP, SOB, HAs, visual changes, edema, abd pain, N/V/D.  HTN- chronic problem, on Ramipril.  Excellent control.  Asymptomatic.  Hyperlipidemia- chronic problem, on Lipitor.  No abd pain, N/V, myalgias.   Review of Systems For ROS see HPI     Objective:   Physical Exam  Vitals reviewed. Constitutional: She is oriented to person, place, and time. She appears well-developed and well-nourished. No distress.  HENT:  Head: Normocephalic and atraumatic.  Eyes: Conjunctivae and EOM are normal. Pupils are equal, round, and reactive to light.  Neck: Normal range of motion. Neck supple. Thyromegaly present.  Cardiovascular: Normal rate, regular rhythm, normal heart sounds and intact distal pulses.   No murmur heard. Pulmonary/Chest: Effort normal and breath sounds normal. No respiratory distress.  Abdominal: Soft. She exhibits no distension. There is no tenderness.  Musculoskeletal: She exhibits no edema.  Lymphadenopathy:    She has no cervical adenopathy.  Neurological: She is alert and oriented to person, place, and time.  Skin: Skin is warm and dry.  Psychiatric: She has a normal mood and affect. Her behavior is normal.          Assessment & Plan:

## 2014-09-16 LAB — TSH: TSH: 1.5 u[IU]/mL (ref 0.350–4.500)

## 2014-09-16 LAB — LIPID PANEL
Cholesterol: 152 mg/dL (ref 0–200)
HDL: 46 mg/dL (ref >39)
LDL Cholesterol: 86 mg/dL (ref 0–99)
Total CHOL/HDL Ratio: 3.3 Ratio
Triglycerides: 99 mg/dL (ref <150)
VLDL: 20 mg/dL (ref 0–40)

## 2014-09-16 LAB — CBC WITH DIFFERENTIAL/PLATELET
Basophils Absolute: 0 10*3/uL (ref 0.0–0.1)
Basophils Relative: 0 % (ref 0–1)
EOS ABS: 0.1 10*3/uL (ref 0.0–0.7)
EOS PCT: 1 % (ref 0–5)
HEMATOCRIT: 38.1 % (ref 36.0–46.0)
HEMOGLOBIN: 13.1 g/dL (ref 12.0–15.0)
LYMPHS ABS: 2.7 10*3/uL (ref 0.7–4.0)
Lymphocytes Relative: 41 % (ref 12–46)
MCH: 31.3 pg (ref 26.0–34.0)
MCHC: 34.4 g/dL (ref 30.0–36.0)
MCV: 91.1 fL (ref 78.0–100.0)
MONO ABS: 0.4 10*3/uL (ref 0.1–1.0)
MONOS PCT: 6 % (ref 3–12)
Neutro Abs: 3.5 10*3/uL (ref 1.7–7.7)
Neutrophils Relative %: 52 % (ref 43–77)
Platelets: 353 10*3/uL (ref 150–400)
RBC: 4.18 MIL/uL (ref 3.87–5.11)
RDW: 13.8 % (ref 11.5–15.5)
WBC: 6.7 10*3/uL (ref 4.0–10.5)

## 2014-09-16 LAB — HEMOGLOBIN A1C
Hgb A1c MFr Bld: 7.1 % — ABNORMAL HIGH (ref <5.7)
Mean Plasma Glucose: 157 mg/dL — ABNORMAL HIGH (ref <117)

## 2014-09-16 LAB — BASIC METABOLIC PANEL
BUN: 10 mg/dL (ref 6–23)
CALCIUM: 9.3 mg/dL (ref 8.4–10.5)
CO2: 26 meq/L (ref 19–32)
CREATININE: 0.62 mg/dL (ref 0.50–1.10)
Chloride: 101 mEq/L (ref 96–112)
GLUCOSE: 128 mg/dL — AB (ref 70–99)
Potassium: 4 mEq/L (ref 3.5–5.3)
Sodium: 138 mEq/L (ref 135–145)

## 2014-09-17 NOTE — Assessment & Plan Note (Signed)
Chronic problem.  Tolerating Lipitor w/o difficulty.  Check labs.  Adjust meds prn  

## 2014-09-17 NOTE — Assessment & Plan Note (Signed)
New to provider but pt reports she has had a previous thyroid US.  Check labs.  Get [place] to assess and r/o dominant nodule.  Pt expressed understanding and is in agreement w/ plan.

## 2014-09-17 NOTE — Assessment & Plan Note (Signed)
Chronic problem.  Tolerating metformin w/o difficulty.  On ACE for renal protection.  UTD on eye exam.  Asymptomatic.  Check labs.  Adjust meds prn

## 2014-09-17 NOTE — Assessment & Plan Note (Addendum)
BP is at goal but this is on ACE.  Suspect pt does have low level of HTN.  Check BMP due to ACE use.  No med changes at this time.  Will follow.

## 2015-01-16 ENCOUNTER — Ambulatory Visit (INDEPENDENT_AMBULATORY_CARE_PROVIDER_SITE_OTHER): Payer: BLUE CROSS/BLUE SHIELD | Admitting: Family Medicine

## 2015-01-16 ENCOUNTER — Encounter: Payer: Self-pay | Admitting: Family Medicine

## 2015-01-16 VITALS — BP 118/78 | HR 76 | Temp 98.5°F | Resp 16 | Ht 60.0 in | Wt 173.0 lb

## 2015-01-16 DIAGNOSIS — Z0000 Encounter for general adult medical examination without abnormal findings: Secondary | ICD-10-CM

## 2015-01-16 DIAGNOSIS — E119 Type 2 diabetes mellitus without complications: Secondary | ICD-10-CM

## 2015-01-16 LAB — HEPATIC FUNCTION PANEL
ALT: 18 U/L (ref 0–35)
AST: 14 U/L (ref 0–37)
Albumin: 4.4 g/dL (ref 3.5–5.2)
Alkaline Phosphatase: 46 U/L (ref 39–117)
BILIRUBIN DIRECT: 0.1 mg/dL (ref 0.0–0.3)
BILIRUBIN TOTAL: 0.5 mg/dL (ref 0.2–1.2)
Total Protein: 7.2 g/dL (ref 6.0–8.3)

## 2015-01-16 LAB — CBC WITH DIFFERENTIAL/PLATELET
BASOS PCT: 0.7 % (ref 0.0–3.0)
Basophils Absolute: 0 10*3/uL (ref 0.0–0.1)
EOS PCT: 0.9 % (ref 0.0–5.0)
Eosinophils Absolute: 0 10*3/uL (ref 0.0–0.7)
HCT: 38.8 % (ref 36.0–46.0)
Hemoglobin: 13.1 g/dL (ref 12.0–15.0)
LYMPHS PCT: 48.6 % — AB (ref 12.0–46.0)
Lymphs Abs: 2.2 10*3/uL (ref 0.7–4.0)
MCHC: 33.7 g/dL (ref 30.0–36.0)
MCV: 92.6 fl (ref 78.0–100.0)
MONOS PCT: 5.4 % (ref 3.0–12.0)
Monocytes Absolute: 0.2 10*3/uL (ref 0.1–1.0)
NEUTROS PCT: 44.4 % (ref 43.0–77.0)
Neutro Abs: 2 10*3/uL (ref 1.4–7.7)
PLATELETS: 337 10*3/uL (ref 150.0–400.0)
RBC: 4.2 Mil/uL (ref 3.87–5.11)
RDW: 13.5 % (ref 11.5–15.5)
WBC: 4.5 10*3/uL (ref 4.0–10.5)

## 2015-01-16 LAB — BASIC METABOLIC PANEL
BUN: 10 mg/dL (ref 6–23)
CO2: 31 mEq/L (ref 19–32)
Calcium: 9.3 mg/dL (ref 8.4–10.5)
Chloride: 102 mEq/L (ref 96–112)
Creatinine, Ser: 0.6 mg/dL (ref 0.40–1.20)
GFR: 137.42 mL/min (ref >60.00)
Glucose, Bld: 116 mg/dL — ABNORMAL HIGH (ref 70–99)
Potassium: 3.7 mEq/L (ref 3.5–5.1)
SODIUM: 137 meq/L (ref 135–145)

## 2015-01-16 LAB — HEMOGLOBIN A1C: Hgb A1c MFr Bld: 6.9 % — ABNORMAL HIGH (ref 4.6–6.5)

## 2015-01-16 LAB — LIPID PANEL
CHOL/HDL RATIO: 4
Cholesterol: 170 mg/dL (ref 0–200)
HDL: 47.8 mg/dL (ref >39.00)
LDL CALC: 110 mg/dL — AB (ref 0–99)
NONHDL: 122.2
TRIGLYCERIDES: 62 mg/dL (ref 0.0–149.0)
VLDL: 12.4 mg/dL (ref 0.0–40.0)

## 2015-01-16 LAB — TSH: TSH: 1.84 u[IU]/mL (ref 0.35–4.50)

## 2015-01-16 LAB — VITAMIN D 25 HYDROXY (VIT D DEFICIENCY, FRACTURES): VITD: 40.42 ng/mL (ref 30.00–100.00)

## 2015-01-16 NOTE — Assessment & Plan Note (Signed)
Check labs.  Adjust meds prn.  UTD on eye exam.  On ACE for renal protection.

## 2015-01-16 NOTE — Progress Notes (Signed)
   Subjective:    Patient ID: Sonia Little, female    DOB: 04-30-1966, 49 y.o.   MRN: [id number]  HPI CPE- UTD on mammo, no need for pap due to hysterectomy (sees Dr Carren Rang).   Review of Systems Patient reports no vision/ hearing changes, adenopathy,fever, weight change,  persistant/recurrent hoarseness , swallowing issues, chest pain, palpitations, edema, persistant/recurrent cough, hemoptysis, dyspnea (rest/exertional/paroxysmal nocturnal), gastrointestinal bleeding (melena, rectal bleeding), abdominal pain, significant heartburn, bowel changes, GU symptoms (dysuria, hematuria, incontinence), Gyn symptoms (abnormal  bleeding, pain),  syncope, focal weakness, memory loss, numbness & tingling, skin/hair/nail changes, abnormal bruising or bleeding, anxiety, or depression.   Reviewed meds, allergies, problem list, and PMH in chart     Objective:   Physical Exam General Appearance:    Alert, cooperative, no distress, appears stated age  Head:    Normocephalic, without obvious abnormality, atraumatic  Eyes:    PERRL, conjunctiva/corneas clear, EOM's intact, fundi    benign, both eyes  Ears:    Normal TM's and external ear canals, both ears  Nose:   Nares normal, septum midline, mucosa normal, no drainage    or sinus tenderness  Throat:   Lips, mucosa, and tongue normal; teeth and gums normal  Neck:   Supple, symmetrical, trachea midline, no adenopathy;    Thyroid: no enlargement/tenderness/nodules  Back:     Symmetric, no curvature, ROM normal, no CVA tenderness  Lungs:     Clear to auscultation bilaterally, respirations unlabored  Chest Wall:    No tenderness or deformity   Heart:    Regular rate and rhythm, S1 and S2 normal, no murmur, rub   or gallop  Breast Exam:    Deferred to GYN  Abdomen:     Soft, non-tender, bowel sounds active all four quadrants,    no masses, no organomegaly  Genitalia:    Deferred to GYN  Rectal:    Extremities:   Extremities normal, atraumatic, no cyanosis  or edema  Pulses:   2+ and symmetric all extremities  Skin:   Skin color, texture, turgor normal, no rashes or lesions  Lymph nodes:   Cervical, supraclavicular, and axillary nodes normal  Neurologic:   CNII-XII intact, normal strength, sensation and reflexes    throughout          Assessment & Plan:

## 2015-01-16 NOTE — Patient Instructions (Signed)
Follow up in 3-4 months to recheck diabetes We'll notify you of your lab results and make any changes if needed Keep up the good work on healthy diet and regular exercise- you look great! Call with any questions or concerns [place]!

## 2015-01-16 NOTE — Progress Notes (Signed)
Pre visit review using our clinic review tool, if applicable. No additional management support is needed unless otherwise documented below in the visit note. 

## 2015-01-16 NOTE — Assessment & Plan Note (Signed)
Pt's PE WNL.  UTD on GYN.  Too young for colonoscopy.  Check labs.  EKG done- see document for interpretation.  Anticipatory guidance provided.

## 2015-02-05 ENCOUNTER — Other Ambulatory Visit: Payer: Self-pay

## 2015-02-06 LAB — CYTOLOGY - PAP

## 2015-03-07 ENCOUNTER — Other Ambulatory Visit: Payer: Self-pay | Admitting: Family Medicine

## 2015-03-08 NOTE — Telephone Encounter (Signed)
Med filled.  

## 2015-04-18 ENCOUNTER — Other Ambulatory Visit: Payer: Self-pay | Admitting: Family Medicine

## 2015-04-19 NOTE — Telephone Encounter (Signed)
Med filled.  

## 2015-05-11 ENCOUNTER — Encounter: Payer: Self-pay | Admitting: Family Medicine

## 2015-05-11 ENCOUNTER — Ambulatory Visit (INDEPENDENT_AMBULATORY_CARE_PROVIDER_SITE_OTHER): Payer: BLUE CROSS/BLUE SHIELD | Admitting: Family Medicine

## 2015-05-11 VITALS — BP 124/82 | HR 72 | Temp 98.2°F | Resp 16 | Ht 60.0 in | Wt 171.5 lb

## 2015-05-11 DIAGNOSIS — E119 Type 2 diabetes mellitus without complications: Secondary | ICD-10-CM | POA: Diagnosis not present

## 2015-05-11 DIAGNOSIS — R030 Elevated blood-pressure reading, without diagnosis of hypertension: Secondary | ICD-10-CM

## 2015-05-11 LAB — BASIC METABOLIC PANEL
BUN: 10 mg/dL (ref 6–23)
CALCIUM: 9.5 mg/dL (ref 8.4–10.5)
CO2: 31 meq/L (ref 19–32)
Chloride: 103 mEq/L (ref 96–112)
Creatinine, Ser: 0.65 mg/dL (ref 0.40–1.20)
GFR: 125.12 mL/min (ref >60.00)
GLUCOSE: 115 mg/dL — AB (ref 70–99)
Potassium: 4.2 mEq/L (ref 3.5–5.1)
Sodium: 139 mEq/L (ref 135–145)

## 2015-05-11 LAB — HEMOGLOBIN A1C: Hgb A1c MFr Bld: 6.8 % — ABNORMAL HIGH (ref 4.6–6.5)

## 2015-05-11 MED ORDER — RAMIPRIL 2.5 MG PO CAPS: ORAL_CAPSULE | ORAL | Status: DC

## 2015-05-11 NOTE — Patient Instructions (Signed)
Follow up in 3-4 months to recheck sugar and cholesterol We'll notify you of your lab results and make any changes if needed Keep up the good work!  You look great! Call with any questions or concerns [place]!!!!

## 2015-05-11 NOTE — Assessment & Plan Note (Signed)
Refill provided on Ramipril

## 2015-05-11 NOTE — Progress Notes (Signed)
Pre visit review using our clinic review tool, if applicable. No additional management support is needed unless otherwise documented below in the visit note. 

## 2015-05-11 NOTE — Progress Notes (Signed)
   Subjective:    Patient ID: Sonia Little, female    DOB: 24-Jul-1966, 49 y.o.   MRN: [id number]  HPI DM- chronic problem, on Metformin.  On ACE for renal protection.  UTD on eye exam, foot exam.  BP well controlled.  No symptomatic lows.  No CP, SOB, HAs, visual changes, edema.  Rare, intermittent hot flashes.   No numbness/tingling of hands/feet.   Review of Systems For ROS see HPI     Objective:   Physical Exam  Constitutional: She is oriented to person, place, and time. She appears well-developed and well-nourished. No distress.  HENT:  Head: Normocephalic and atraumatic.  Eyes: Conjunctivae and EOM are normal. Pupils are equal, round, and reactive to light.  Neck: Normal range of motion. Neck supple. No thyromegaly present.  Cardiovascular: Normal rate, regular rhythm, normal heart sounds and intact distal pulses.   No murmur heard. Pulmonary/Chest: Effort normal and breath sounds normal. No respiratory distress.  Abdominal: Soft. She exhibits no distension. There is no tenderness.  Musculoskeletal: She exhibits no edema.  Lymphadenopathy:    She has no cervical adenopathy.  Neurological: She is alert and oriented to person, place, and time.  Skin: Skin is warm and dry.  Psychiatric: She has a normal mood and affect. Her behavior is normal.  Vitals reviewed.         Assessment & Plan:

## 2015-05-11 NOTE — Assessment & Plan Note (Signed)
Chronic problem.  Hx of good control.  Asymptomatic.  UTD on eye and foot exam.  On ACE for renal protection.  Check labs.  Adjust meds prn

## 2015-06-15 LAB — HM DIABETES FOOT EXAM

## 2015-06-18 ENCOUNTER — Encounter: Payer: Self-pay | Admitting: General Practice

## 2015-07-19 LAB — HM DIABETES EYE EXAM

## 2015-08-06 ENCOUNTER — Other Ambulatory Visit: Payer: Self-pay

## 2015-08-06 DIAGNOSIS — Z1231 Encounter for screening mammogram for malignant neoplasm of breast: Secondary | ICD-10-CM

## 2015-08-13 ENCOUNTER — Other Ambulatory Visit: Payer: Self-pay | Admitting: Gynecology

## 2015-08-14 ENCOUNTER — Encounter: Payer: Self-pay | Admitting: Family Medicine

## 2015-08-14 ENCOUNTER — Ambulatory Visit (INDEPENDENT_AMBULATORY_CARE_PROVIDER_SITE_OTHER): Payer: BLUE CROSS/BLUE SHIELD | Admitting: Family Medicine

## 2015-08-14 VITALS — BP 130/78 | HR 79 | Temp 98.5°F | Resp 16 | Ht 60.0 in | Wt 172.4 lb

## 2015-08-14 DIAGNOSIS — R030 Elevated blood-pressure reading, without diagnosis of hypertension: Secondary | ICD-10-CM

## 2015-08-14 DIAGNOSIS — E1169 Type 2 diabetes mellitus with other specified complication: Secondary | ICD-10-CM

## 2015-08-14 DIAGNOSIS — E785 Hyperlipidemia, unspecified: Secondary | ICD-10-CM

## 2015-08-14 DIAGNOSIS — E119 Type 2 diabetes mellitus without complications: Secondary | ICD-10-CM

## 2015-08-14 DIAGNOSIS — Z23 Encounter for immunization: Secondary | ICD-10-CM

## 2015-08-14 LAB — LIPID PANEL
CHOL/HDL RATIO: 4
Cholesterol: 178 mg/dL (ref 0–200)
HDL: 48.8 mg/dL (ref >39.00)
LDL Cholesterol: 109 mg/dL — ABNORMAL HIGH (ref 0–99)
NONHDL: 129.04
Triglycerides: 99 mg/dL (ref 0.0–149.0)
VLDL: 19.8 mg/dL (ref 0.0–40.0)

## 2015-08-14 LAB — CBC WITH DIFFERENTIAL/PLATELET
BASOS PCT: 0.5 % (ref 0.0–3.0)
Basophils Absolute: 0 10*3/uL (ref 0.0–0.1)
Eosinophils Absolute: 0 10*3/uL (ref 0.0–0.7)
Eosinophils Relative: 0.9 % (ref 0.0–5.0)
HCT: 38.9 % (ref 36.0–46.0)
HEMOGLOBIN: 12.9 g/dL (ref 12.0–15.0)
LYMPHS ABS: 2.4 10*3/uL (ref 0.7–4.0)
Lymphocytes Relative: 47.2 % — ABNORMAL HIGH (ref 12.0–46.0)
MCHC: 33.1 g/dL (ref 30.0–36.0)
MCV: 94.1 fl (ref 78.0–100.0)
MONO ABS: 0.2 10*3/uL (ref 0.1–1.0)
Monocytes Relative: 3.9 % (ref 3.0–12.0)
NEUTROS PCT: 47.5 % (ref 43.0–77.0)
Neutro Abs: 2.4 10*3/uL (ref 1.4–7.7)
Platelets: 328 10*3/uL (ref 150.0–400.0)
RBC: 4.13 Mil/uL (ref 3.87–5.11)
RDW: 13.6 % (ref 11.5–15.5)
WBC: 5 10*3/uL (ref 4.0–10.5)

## 2015-08-14 LAB — HEPATIC FUNCTION PANEL
ALT: 14 U/L (ref 0–35)
AST: 11 U/L (ref 0–37)
Albumin: 4.2 g/dL (ref 3.5–5.2)
Alkaline Phosphatase: 54 U/L (ref 39–117)
BILIRUBIN TOTAL: 0.6 mg/dL (ref 0.2–1.2)
Bilirubin, Direct: 0.1 mg/dL (ref 0.0–0.3)
Total Protein: 7.1 g/dL (ref 6.0–8.3)

## 2015-08-14 LAB — HEMOGLOBIN A1C: HEMOGLOBIN A1C: 6.9 % — AB (ref 4.6–6.5)

## 2015-08-14 LAB — BASIC METABOLIC PANEL
BUN: 10 mg/dL (ref 6–23)
CALCIUM: 9.2 mg/dL (ref 8.4–10.5)
CO2: 30 mEq/L (ref 19–32)
Chloride: 102 mEq/L (ref 96–112)
Creatinine, Ser: 0.59 mg/dL (ref 0.40–1.20)
GFR: 139.77 mL/min (ref >60.00)
Glucose, Bld: 117 mg/dL — ABNORMAL HIGH (ref 70–99)
POTASSIUM: 3.6 meq/L (ref 3.5–5.1)
Sodium: 140 mEq/L (ref 135–145)

## 2015-08-14 LAB — CYTOLOGY - PAP

## 2015-08-14 NOTE — Assessment & Plan Note (Signed)
Ongoing issue.  Sugars are well controlled by pt report.  Tolerating metformin w/o difficulty.  Has eye exam scheduled for next week.  UTD on foot exam.  On ACE for renal protection.  Check labs.  Adjust meds prn

## 2015-08-14 NOTE — Assessment & Plan Note (Signed)
**Note De-identified Leeroy Lovings Obfuscation** Chronic problem.  Tolerating statin w/o difficulty.  Check labs.  Adjust meds prn  

## 2015-08-14 NOTE — Progress Notes (Signed)
   Subjective:    Patient ID: Sonia Little, female    DOB: 12-18-1965, 49 y.o.   MRN: [id number]  HPI DM- chronic problem, on Metformin daily.  On ACE for renal protection.  Due for eye exam this month- scheduled for next week.  Home CBGs are 'pretty good'.  Yesterday was 123 and 'that's on the high end'.  Typically running 110s.  Denies symptomatic lows.  No numbness/tingling of hands feet.  HTN- chronic problem, on Ramipril.  Adequate control. No CP, SOB, HAs, visual changes, edema.   Hyperlipidemia- chronic problem, on Lipitor daily.  No abd pain, N/V   Review of Systems For ROS see HPI     Objective:   Physical Exam  Constitutional: She is oriented to person, place, and time. She appears well-developed and well-nourished. No distress.  HENT:  Head: Normocephalic and atraumatic.  Eyes: Conjunctivae and EOM are normal. Pupils are equal, round, and reactive to light.  Neck: Normal range of motion. Neck supple. No thyromegaly present.  Cardiovascular: Normal rate, regular rhythm, normal heart sounds and intact distal pulses.   No murmur heard. Pulmonary/Chest: Effort normal and breath sounds normal. No respiratory distress.  Abdominal: Soft. She exhibits no distension. There is no tenderness.  Musculoskeletal: She exhibits no edema.  Lymphadenopathy:    She has no cervical adenopathy.  Neurological: She is alert and oriented to person, place, and time.  Skin: Skin is warm and dry.  Psychiatric: She has a normal mood and affect. Her behavior is normal.  Vitals reviewed.         Assessment & Plan:

## 2015-08-14 NOTE — Progress Notes (Signed)
Pre visit review using our clinic review tool, if applicable. No additional management support is needed unless otherwise documented below in the visit note. 

## 2015-08-14 NOTE — Assessment & Plan Note (Signed)
Adequate control on low dose lisinopril.  Asymptomatic.  Check labs.  No anticipated med changes.

## 2015-08-14 NOTE — Patient Instructions (Signed)
Follow up in 3-4 months to recheck diabetes We'll notify you of your lab results and make any changes if needed Continue to work on healthy diet and regular exercise- you look good!! Call with any questions or concerns If you want to join [place] at the new [place] office, any scheduled appointments will automatically transfer and we will see you at [address] [place] [address] [place],  [id number]  Happy Fall!!!

## 2015-08-20 ENCOUNTER — Encounter (HOSPITAL_COMMUNITY): Payer: Self-pay | Admitting: Emergency Medicine

## 2015-08-20 DIAGNOSIS — Z79899 Other long term (current) drug therapy: Secondary | ICD-10-CM | POA: Diagnosis not present

## 2015-08-20 DIAGNOSIS — I10 Essential (primary) hypertension: Secondary | ICD-10-CM | POA: Diagnosis not present

## 2015-08-20 DIAGNOSIS — E785 Hyperlipidemia, unspecified: Secondary | ICD-10-CM | POA: Diagnosis not present

## 2015-08-20 DIAGNOSIS — R197 Diarrhea, unspecified: Secondary | ICD-10-CM | POA: Diagnosis not present

## 2015-08-20 DIAGNOSIS — H8110 Benign paroxysmal vertigo, unspecified ear: Secondary | ICD-10-CM | POA: Insufficient documentation

## 2015-08-20 DIAGNOSIS — E119 Type 2 diabetes mellitus without complications: Secondary | ICD-10-CM | POA: Diagnosis not present

## 2015-08-20 DIAGNOSIS — R110 Nausea: Secondary | ICD-10-CM | POA: Diagnosis present

## 2015-08-20 DIAGNOSIS — Z7952 Long term (current) use of systemic steroids: Secondary | ICD-10-CM | POA: Insufficient documentation

## 2015-08-20 LAB — URINALYSIS, ROUTINE W REFLEX MICROSCOPIC
Bilirubin Urine: NEGATIVE
Glucose, UA: 250 mg/dL — AB
Hgb urine dipstick: NEGATIVE
Ketones, ur: 15 mg/dL — AB
LEUKOCYTES UA: NEGATIVE
NITRITE: NEGATIVE
Protein, ur: NEGATIVE mg/dL
SPECIFIC GRAVITY, URINE: 1.021 (ref 1.005–1.030)
UROBILINOGEN UA: 1 mg/dL (ref 0.0–1.0)
pH: 6.5 (ref 5.0–8.0)

## 2015-08-20 LAB — CBC
HEMATOCRIT: 37.5 % (ref 36.0–46.0)
HEMOGLOBIN: 12.8 g/dL (ref 12.0–15.0)
MCH: 31.3 pg (ref 26.0–34.0)
MCHC: 34.1 g/dL (ref 30.0–36.0)
MCV: 91.7 fL (ref 78.0–100.0)
Platelets: 313 10*3/uL (ref 150–400)
RBC: 4.09 MIL/uL (ref 3.87–5.11)
RDW: 12.9 % (ref 11.5–15.5)
WBC: 8.5 10*3/uL (ref 4.0–10.5)

## 2015-08-20 NOTE — ED Notes (Signed)
Pt. reports multiple emesis with diarrhea , dizziness and headache onset this evening , denies fever or chills.

## 2015-08-21 ENCOUNTER — Emergency Department (HOSPITAL_COMMUNITY)
Admission: EM | Admit: 2015-08-21 | Discharge: 2015-08-21 | Disposition: A | Discharge status: Home / Self Care | Payer: BLUE CROSS/BLUE SHIELD | Attending: Emergency Medicine | Admitting: Emergency Medicine

## 2015-08-21 DIAGNOSIS — H8110 Benign paroxysmal vertigo, unspecified ear: Secondary | ICD-10-CM

## 2015-08-21 LAB — COMPREHENSIVE METABOLIC PANEL
ALBUMIN: 4 g/dL (ref 3.5–5.0)
ALK PHOS: 49 U/L (ref 38–126)
ALT: 18 U/L (ref 14–54)
AST: 21 U/L (ref 15–41)
Anion gap: 10 (ref 5–15)
BILIRUBIN TOTAL: 0.5 mg/dL (ref 0.3–1.2)
BUN: 9 mg/dL (ref 6–20)
CALCIUM: 9.5 mg/dL (ref 8.9–10.3)
CO2: 26 mmol/L (ref 22–32)
CREATININE: 0.7 mg/dL (ref 0.44–1.00)
Chloride: 97 mmol/L — ABNORMAL LOW (ref 101–111)
GFR calc Af Amer: >60 mL/min (ref >60)
GFR calc non Af Amer: >60 mL/min (ref >60)
GLUCOSE: 191 mg/dL — AB (ref 65–99)
POTASSIUM: 3.9 mmol/L (ref 3.5–5.1)
Sodium: 133 mmol/L — ABNORMAL LOW (ref 135–145)
TOTAL PROTEIN: 6.9 g/dL (ref 6.5–8.1)

## 2015-08-21 LAB — LIPASE, BLOOD: Lipase: 30 U/L (ref 22–51)

## 2015-08-21 MED ORDER — MECLIZINE HCL 50 MG PO TABS: 50.0000 mg | ORAL_TABLET | Freq: Three times a day (TID) | ORAL | Status: DC | PRN

## 2015-08-21 MED ORDER — MECLIZINE HCL 25 MG PO TABS
50.0000 mg | ORAL_TABLET | Freq: Once | ORAL | Status: AC
  Administered 2015-08-21: 50 mg via ORAL
  Filled 2015-08-21: qty 2

## 2015-08-21 NOTE — ED Provider Notes (Signed)
CSN: [id number]   Arrival date & time 08/20/15 2313  History  By signing my name below, I, Altamease Oiler, attest that this documentation has been prepared under the direction and in the presence of Varney Biles, MD. Electronically Signed: Altamease Oiler, ED Scribe. 08/21/2015. 4:10 AM.  Chief Complaint  Patient presents with  . Diarrhea  . Emesis    HPI The history is provided by the patient. No language interpreter was used.   Sonia Little is a 49 y.o. female with PMHx of DM, HTN, and HLD who presents to the Emergency Department complaining of a 3 hour episode of constant and  severe room-spinning dizziness with onset last evening. Associated symptoms include n/v/d. Pt went to bed with a frontal headache and got up when she felt nauseous and noticed the vertigo.She has had similar episodes in the past that she has to be seen for. Her last episode was 4-5 years ago.  Pt denies tinnitus, numbness, tingling, and vision change. At present the dizziness has resolved. She has family history of vertigo but not brain tumors or aneurysm.   Past Medical History  Diagnosis Date  . Type II or unspecified type diabetes mellitus without mention of complication, not stated as uncontrolled   . Other and unspecified hyperlipidemia   . Exophthalmos, unspecified   . Hypertension   . CTS (carpal tunnel syndrome)     Past Surgical History  Procedure Laterality Date  . Tonsillectomy    . Wisdom tooth extraction    . Abdominal hysterectomy  2011    For fibroids with pelvic pain     Family History  Problem Relation Age of Onset  . Hypertension Father   . Diabetes Father   . Heart failure Father   . Diabetes Mother   . Hypertension Mother   . Hypertension Sister   . Hypertension Brother   . Diabetes Maternal Aunt   . Diabetes Maternal Uncle     Social History  Substance Use Topics  . Smoking status: Never Smoker   . Smokeless tobacco: None  . Alcohol Use: No     Review of Systems   Constitutional: Negative for fever and chills.  Gastrointestinal: Positive for nausea, vomiting and diarrhea.  Neurological: Positive for dizziness. Negative for weakness.  All other systems reviewed and are negative.  Home Medications   Prior to Admission medications   Medication Sig Start Date End Date Taking? Authorizing Provider  atorvastatin (LIPITOR) 20 MG tablet TAKE ONE TABLET BY MOUTH ONCE DAILY 04/19/15  Yes Midge Minium, MD  fexofenadine (ALLEGRA) 180 MG tablet Take 180 mg by mouth every other day.    Yes Historical Provider, MD  fish oil-omega-3 fatty acids 1000 MG capsule Take 2 g by mouth daily.     Yes Historical Provider, MD  FLUOCINOLONE ACETONIDE SCALP 0.01 % OIL Pt uses oil on scalp once a week. 08/04/13  Yes Historical Provider, MD  metFORMIN (GLUCOPHAGE-XR) 500 MG 24 hr tablet TAKE ONE TABLET BY MOUTH TWICE DAILY 03/08/15  Yes Midge Minium, MD  Multiple Vitamin (MULTIVITAMIN) tablet Take 1 tablet by mouth daily. Diabetic combo pak    Yes Historical Provider, MD  PROAIR HFA 108 (90 BASE) MCG/ACT inhaler Inhale 1 puff into the lungs as needed. 08/03/13  Yes Historical Provider, MD  ramipril (ALTACE) 2.5 MG capsule TAKE ONE CAPSULE BY MOUTH EVERY DAY 05/11/15  Yes Midge Minium, MD  meclizine (ANTIVERT) 50 MG tablet Take 1 tablet (50 mg total) by mouth  3 (three) times daily as needed. 08/21/15   Varney Biles, MD    Allergies  Aspirin and Simvastatin  Triage Vitals: BP 108/64 mmHg  Pulse 81  Temp(Src) 97.4 F (36.3 C) (Oral)  Resp 16  Ht 5' (1.524 m)  Wt 172 lb (78.019 kg)  BMI 33.59 kg/m2  SpO2 99%  Physical Exam  Constitutional: She is oriented to person, place, and time. She appears well-developed and well-nourished. No distress.  HENT:  Head: Normocephalic and atraumatic.  Eyes: EOM are normal.  No horizontal or vertical nystagmus Pupils 5 mm, equal, and reactive to light  Neck: Normal range of motion.  Cardiovascular: Normal rate and  regular rhythm.   Pulmonary/Chest: Effort normal. No respiratory distress.  Lungs clear to anterior auscultation  Musculoskeletal: Normal range of motion.  Neurological: She is alert and oriented to person, place, and time. No cranial nerve deficit. Coordination normal.  Cranial nerve 2-12 intact Cerebellar exam negative for dysmetria  Negative Hint's exam  Skin: Skin is warm and dry. She is not diaphoretic.  Psychiatric: She has a normal mood and affect.  Nursing note and vitals reviewed.   ED Course  Procedures   DIAGNOSTIC STUDIES: Oxygen Saturation is 99% on RA, normal by my interpretation.    COORDINATION OF CARE: 3:33 AM Discussed treatment plan which includes lab work and discharge with meclizine with pt at bedside and pt agreed to plan.  Labs Reviewed  COMPREHENSIVE METABOLIC PANEL - Abnormal; Notable for the following:    Sodium 133 (*)    Chloride 97 (*)    Glucose, Bld 191 (*)    All other components within normal limits  URINALYSIS, ROUTINE W REFLEX MICROSCOPIC (NOT AT [facility]) - Abnormal; Notable for the following:    Glucose, UA 250 (*)    Ketones, ur 15 (*)    All other components within normal limits  LIPASE, BLOOD  CBC    Imaging Review No results found.  EKG Interpretation  Date/Time:    Ventricular Rate:    PR Interval:    QRS Duration:   QT Interval:    QTC Calculation:   R Axis:     Text Interpretation:      MDM   Final diagnoses:  BPPV (benign paroxysmal positional vertigo), unspecified laterality    I personally performed the services described in this documentation, which was scribed in my presence. The recorded information has been reviewed and is accurate.  Pt comes in with cc of vertigo with nausea, emesis and loose BM. Hx of vertigo with similar sx. Her neuro exam is non focal. Pt also has a mild headache, it is anterior and there is no neck pain or stiffness.  DDx includes: Peripheral Vertigo:  BPPV  Vestibular  neuritis  Meniere disease  Migrainous vertigo  Ear Infection   This appears to be a case of peripheral vertigo. Pt is asymptomatic, neg HINTs. Will give meclezine and provide epley's. Pt will see ENT if not getting better.    Varney Biles, MD 08/21/15 [phone]

## 2015-08-21 NOTE — Discharge Instructions (Signed)
We saw you in the ER for the spinning sensation. All the results in the ER are normal. We are not sure what is causing your symptoms, but we think you have BPPV or peripheral vertigo. The workup in the ER is not complete, and is limited to screening for life threatening and emergent conditions only, so please see a primary care doctor for further evaluation. Use the medicine prescribed and complete the exercises recommended. ENT doctor contact info has been provided as well.   Vertigo Vertigo means you feel like you or your surroundings are moving when they are not. Vertigo can be dangerous if it occurs when you are at work, driving, or performing difficult activities.  CAUSES  Vertigo occurs when there is a conflict of signals sent to your brain from the visual and sensory systems in your body. There are many different causes of vertigo, including: 1. Infections, especially in the inner ear. 2. A bad reaction to a drug or misuse of alcohol and medicines. 3. Withdrawal from drugs or alcohol. 4. Rapidly changing positions, such as lying down or rolling over in bed. 5. A migraine headache. 6. Decreased blood flow to the brain. 7. Increased pressure in the brain from a head injury, infection, tumor, or bleeding. SYMPTOMS  You may feel as though the world is spinning around or you are falling to the ground. Because your balance is upset, vertigo can cause nausea and vomiting. You may have involuntary eye movements (nystagmus). DIAGNOSIS  Vertigo is usually diagnosed by physical exam. If the cause of your vertigo is unknown, your caregiver may perform imaging tests, such as an MRI scan (magnetic resonance imaging). TREATMENT  Most cases of vertigo resolve on their own, without treatment. Depending on the cause, your caregiver may prescribe certain medicines. If your vertigo is related to body position issues, your caregiver may recommend movements or procedures to correct the problem. In rare  cases, if your vertigo is caused by certain inner ear problems, you may need surgery. HOME CARE INSTRUCTIONS   Follow your caregiver's instructions.  Avoid driving.  Avoid operating heavy machinery.  Avoid performing any tasks that would be dangerous to you or others during a vertigo episode.  Tell your caregiver if you notice that certain medicines seem to be causing your vertigo. Some of the medicines used to treat vertigo episodes can actually make them worse in some people. SEEK IMMEDIATE MEDICAL CARE IF:   Your medicines do not relieve your vertigo or are making it worse.  You develop problems with talking, walking, weakness, or using your arms, hands, or legs.  You develop severe headaches.  Your nausea or vomiting continues or gets worse.  You develop visual changes.  A family member notices behavioral changes.  Your condition gets worse. MAKE SURE YOU:  Understand these instructions.  Will watch your condition.  Will get help right away if you are not doing well or get worse. Document Released: 08/13/2005 Document Revised: 01/26/2012 Document Reviewed: 05/22/2011 [facility] Patient Information 2015 [place], [place]. This information is not intended to replace advice given to you by your health care provider. Make sure you discuss any questions you have with your health care provider.  Epley Maneuver Self-Care WHAT IS THE EPLEY MANEUVER? The Epley maneuver is an exercise you can do to relieve symptoms of benign paroxysmal positional vertigo (BPPV). This condition is often just referred to as vertigo. BPPV is caused by the movement of tiny crystals (canaliths) inside your inner ear. The accumulation and  movement of canaliths in your inner ear causes a sudden spinning sensation (vertigo) when you move your head to certain positions. Vertigo usually lasts about 30 seconds. BPPV usually occurs in just one ear. If you get vertigo when you lie on your left side, you probably have  BPPV in your left ear. Your health care provider can tell you which ear is involved.  BPPV may be caused by a head injury. Many people older than 50 get BPPV for unknown reasons. If you have been diagnosed with BPPV, your health care provider may teach you how to do this maneuver. BPPV is not life threatening (benign) and usually goes away in time.  WHEN SHOULD I PERFORM THE EPLEY MANEUVER? You can do this maneuver at home whenever you have symptoms of vertigo. You may do the Epley maneuver up to 3 times a day until your symptoms of vertigo go away. HOW SHOULD I DO THE EPLEY MANEUVER? 8. Sit on the edge of a bed or table with your back straight. Your legs should be extended or hanging over the edge of the bed or table.  9. Turn your head halfway toward the affected ear.  10. Lie backward quickly with your head turned until you are lying flat on your back. You may want to position a pillow under your shoulders.  11. Hold this position for 30 seconds. You may experience an attack of vertigo. This is normal. Hold this position until the vertigo stops. 12. Then turn your head to the opposite direction until your unaffected ear is facing the floor.  13. Hold this position for 30 seconds. You may experience an attack of vertigo. This is normal. Hold this position until the vertigo stops. 14. Now turn your whole body to the same side as your head. Hold for another 30 seconds.  15. You can then sit back up. ARE THERE RISKS TO THIS MANEUVER? In some cases, you may have other symptoms (such as changes in your vision, weakness, or numbness). If you have these symptoms, stop doing the maneuver and call your health care provider. Even if doing these maneuvers relieves your vertigo, you may still have dizziness. Dizziness is the sensation of light-headedness but without the sensation of movement. Even though the Epley maneuver may relieve your vertigo, it is possible that your symptoms will return within 5  years. WHAT SHOULD I DO AFTER THIS MANEUVER? After doing the Epley maneuver, you can return to your normal activities. Ask your doctor if there is anything you should do at home to prevent vertigo. This may include:  Sleeping with two or more pillows to keep your head elevated.  Not sleeping on the side of your affected ear.  Getting up slowly from bed.  Avoiding sudden movements during the day.  Avoiding extreme head movement, like looking up or bending over.  Wearing a cervical collar to prevent sudden head movements. WHAT SHOULD I DO IF MY SYMPTOMS GET WORSE? Call your health care provider if your vertigo gets worse. Call your provider right way if you have other symptoms, including:   Nausea.  Vomiting.  Headache.  Weakness.  Numbness.  Vision changes. Document Released: 11/08/2013 Document Reviewed: 11/08/2013 [facility] Patient Information 2015 [place], [place]. This information is not intended to replace advice given to you by your health care provider. Make sure you discuss any questions you have with your health care provider.

## 2015-08-21 NOTE — ED Notes (Signed)
Pt getting anxious to be dc home, MD to be notified.

## 2015-09-06 ENCOUNTER — Other Ambulatory Visit: Payer: Self-pay | Admitting: Family Medicine

## 2015-09-07 ENCOUNTER — Ambulatory Visit
Admission: RE | Admit: 2015-09-07 | Discharge: 2015-09-07 | Disposition: A | Discharge status: Home / Self Care | Payer: BLUE CROSS/BLUE SHIELD | Source: Ambulatory Visit

## 2015-09-07 DIAGNOSIS — Z1231 Encounter for screening mammogram for malignant neoplasm of breast: Secondary | ICD-10-CM

## 2015-09-07 NOTE — Telephone Encounter (Signed)
Medication filled to pharmacy as requested.   

## 2015-09-11 ENCOUNTER — Other Ambulatory Visit: Payer: Self-pay | Admitting: Family Medicine

## 2015-09-11 DIAGNOSIS — R928 Other abnormal and inconclusive findings on diagnostic imaging of breast: Secondary | ICD-10-CM

## 2015-09-13 ENCOUNTER — Ambulatory Visit
Admission: RE | Admit: 2015-09-13 | Discharge: 2015-09-13 | Disposition: A | Discharge status: Home / Self Care | Payer: BLUE CROSS/BLUE SHIELD | Source: Ambulatory Visit | Attending: Family Medicine | Admitting: Family Medicine

## 2015-09-13 DIAGNOSIS — R928 Other abnormal and inconclusive findings on diagnostic imaging of breast: Secondary | ICD-10-CM

## 2015-10-19 ENCOUNTER — Other Ambulatory Visit: Payer: Self-pay | Admitting: Family Medicine

## 2015-10-19 NOTE — Telephone Encounter (Signed)
Medication filled to pharmacy as requested.   

## 2015-11-26 ENCOUNTER — Encounter: Payer: Self-pay | Admitting: Family Medicine

## 2015-11-26 ENCOUNTER — Ambulatory Visit (INDEPENDENT_AMBULATORY_CARE_PROVIDER_SITE_OTHER): Payer: BLUE CROSS/BLUE SHIELD | Admitting: Family Medicine

## 2015-11-26 ENCOUNTER — Ambulatory Visit: Payer: BLUE CROSS/BLUE SHIELD | Admitting: Family Medicine

## 2015-11-26 VITALS — BP 122/78 | HR 97 | Temp 98.1°F | Ht 60.0 in | Wt 173.0 lb

## 2015-11-26 DIAGNOSIS — E119 Type 2 diabetes mellitus without complications: Secondary | ICD-10-CM

## 2015-11-26 LAB — BASIC METABOLIC PANEL
BUN: 10 mg/dL (ref 6–23)
CO2: 29 mEq/L (ref 19–32)
Calcium: 10 mg/dL (ref 8.4–10.5)
Chloride: 100 mEq/L (ref 96–112)
Creatinine, Ser: 0.6 mg/dL (ref 0.40–1.20)
GFR: 136.92 mL/min (ref >60.00)
Glucose, Bld: 140 mg/dL — ABNORMAL HIGH (ref 70–99)
Potassium: 3.8 mEq/L (ref 3.5–5.1)
Sodium: 135 mEq/L (ref 135–145)

## 2015-11-26 LAB — HEMOGLOBIN A1C: HEMOGLOBIN A1C: 7.1 % — AB (ref 4.6–6.5)

## 2015-11-26 NOTE — Progress Notes (Signed)
   Subjective:    Patient ID: Sonia Little, female    DOB: [birth year], 50 y.o.   MRN: [id number]  HPI DM- chronic problem, on Metformin.  On ACE for renal protection.  UTD on foot exam.  UTD on eye exam- got new glasses.  No retinopathy.  No symptomatic lows.  No numbness/tingling of hands/feet.  No CP, SOB, HAs, visual changes, abd pain, N/V.   Review of Systems For ROS see HPI     Objective:   Physical Exam  Constitutional: She is oriented to person, place, and time. She appears well-developed and well-nourished. No distress.  HENT:  Head: Normocephalic and atraumatic.  Eyes: Conjunctivae and EOM are normal. Pupils are equal, round, and reactive to light.  Neck: Normal range of motion. Neck supple. No thyromegaly present.  Cardiovascular: Normal rate, regular rhythm, normal heart sounds and intact distal pulses.   No murmur heard. Pulmonary/Chest: Effort normal and breath sounds normal. No respiratory distress.  Abdominal: Soft. She exhibits no distension. There is no tenderness.  Musculoskeletal: She exhibits no edema.  Lymphadenopathy:    She has no cervical adenopathy.  Neurological: She is alert and oriented to person, place, and time.  Skin: Skin is warm and dry.  Psychiatric: She has a normal mood and affect. Her behavior is normal.  Vitals reviewed.         Assessment & Plan:

## 2015-11-26 NOTE — Progress Notes (Signed)
Pre visit review using our clinic review tool, if applicable. No additional management support is needed unless otherwise documented below in the visit note. 

## 2015-11-26 NOTE — Assessment & Plan Note (Signed)
Pt's diabetes is typically well controlled.  UTD on foot exam and eye exam.  On ACE for renal protection.  Asymptomatic.  Check labs.  Adjust meds prn

## 2015-11-26 NOTE — Patient Instructions (Signed)
Schedule your complete physical in 3-4 months We'll notify you of your lab results and make any changes if needed Keep up the good work- you look great!!! Call with any questions or concerns Happy New Year!!!

## 2016-01-15 ENCOUNTER — Other Ambulatory Visit: Payer: Self-pay | Admitting: Family Medicine

## 2016-01-16 NOTE — Telephone Encounter (Signed)
Medication filled to pharmacy as requested.   

## 2016-03-03 ENCOUNTER — Other Ambulatory Visit: Payer: Self-pay | Admitting: Family Medicine

## 2016-03-03 NOTE — Telephone Encounter (Signed)
Medication filled to pharmacy as requested.   

## 2016-03-31 ENCOUNTER — Encounter: Payer: Self-pay | Admitting: Family Medicine

## 2016-03-31 ENCOUNTER — Ambulatory Visit (INDEPENDENT_AMBULATORY_CARE_PROVIDER_SITE_OTHER): Payer: BLUE CROSS/BLUE SHIELD | Admitting: Family Medicine

## 2016-03-31 VITALS — BP 122/78 | HR 62 | Temp 98.1°F | Resp 16 | Ht 60.0 in | Wt 166.1 lb

## 2016-03-31 DIAGNOSIS — E119 Type 2 diabetes mellitus without complications: Secondary | ICD-10-CM

## 2016-03-31 DIAGNOSIS — Z0000 Encounter for general adult medical examination without abnormal findings: Secondary | ICD-10-CM | POA: Diagnosis not present

## 2016-03-31 LAB — CBC WITH DIFFERENTIAL/PLATELET
BASOS ABS: 0 10*3/uL (ref 0.0–0.1)
BASOS PCT: 0.3 % (ref 0.0–3.0)
EOS ABS: 0.1 10*3/uL (ref 0.0–0.7)
Eosinophils Relative: 1.1 % (ref 0.0–5.0)
HEMATOCRIT: 39.1 % (ref 36.0–46.0)
Hemoglobin: 13.1 g/dL (ref 12.0–15.0)
LYMPHS ABS: 2.5 10*3/uL (ref 0.7–4.0)
LYMPHS PCT: 48.5 % — AB (ref 12.0–46.0)
MCHC: 33.5 g/dL (ref 30.0–36.0)
MCV: 93.9 fl (ref 78.0–100.0)
MONOS PCT: 5 % (ref 3.0–12.0)
Monocytes Absolute: 0.3 10*3/uL (ref 0.1–1.0)
NEUTROS ABS: 2.3 10*3/uL (ref 1.4–7.7)
NEUTROS PCT: 45.1 % (ref 43.0–77.0)
PLATELETS: 324 10*3/uL (ref 150.0–400.0)
RBC: 4.16 Mil/uL (ref 3.87–5.11)
RDW: 13.5 % (ref 11.5–15.5)
WBC: 5.2 10*3/uL (ref 4.0–10.5)

## 2016-03-31 LAB — BASIC METABOLIC PANEL
BUN: 13 mg/dL (ref 6–23)
CHLORIDE: 103 meq/L (ref 96–112)
CO2: 28 meq/L (ref 19–32)
Calcium: 9.5 mg/dL (ref 8.4–10.5)
Creatinine, Ser: 0.59 mg/dL (ref 0.40–1.20)
GFR: 139.4 mL/min (ref >60.00)
Glucose, Bld: 101 mg/dL — ABNORMAL HIGH (ref 70–99)
POTASSIUM: 3.7 meq/L (ref 3.5–5.1)
SODIUM: 138 meq/L (ref 135–145)

## 2016-03-31 LAB — HEPATIC FUNCTION PANEL
ALK PHOS: 47 U/L (ref 39–117)
ALT: 16 U/L (ref 0–35)
AST: 15 U/L (ref 0–37)
Albumin: 4.6 g/dL (ref 3.5–5.2)
BILIRUBIN DIRECT: 0.1 mg/dL (ref 0.0–0.3)
BILIRUBIN TOTAL: 0.8 mg/dL (ref 0.2–1.2)
TOTAL PROTEIN: 7.3 g/dL (ref 6.0–8.3)

## 2016-03-31 LAB — LIPID PANEL
CHOLESTEROL: 191 mg/dL (ref 0–200)
HDL: 43.6 mg/dL (ref >39.00)
LDL Cholesterol: 127 mg/dL — ABNORMAL HIGH (ref 0–99)
NONHDL: 147.33
Total CHOL/HDL Ratio: 4
Triglycerides: 102 mg/dL (ref 0.0–149.0)
VLDL: 20.4 mg/dL (ref 0.0–40.0)

## 2016-03-31 LAB — HEMOGLOBIN A1C: HEMOGLOBIN A1C: 6.8 % — AB (ref 4.6–6.5)

## 2016-03-31 NOTE — Assessment & Plan Note (Signed)
Chronic problem.  Hx of good control.  Applauded her recent weight loss.  UTD on foot exam, eye exam.  On ACE for renal protection.  Check labs.  Adjust meds prn

## 2016-03-31 NOTE — Assessment & Plan Note (Signed)
Pt's PE WNL.  UTD on mammo, pap.  Too young for colonoscopy.  Check labs.  Anticipatory guidance provided.

## 2016-03-31 NOTE — Progress Notes (Signed)
   Subjective:    Patient ID: Sonia Little, female    DOB: 05-15-1966, 50 y.o.   MRN: [id number]  HPI CPE- UTD on mammo, pap.  Too young for colonoscopy.  UTD on foot exam, eye exam.  On ACE for renal protection.  Has lost 7 lbs since last visit.   Review of Systems Patient reports no vision/ hearing changes, adenopathy,fever, weight change,  persistant/recurrent hoarseness , swallowing issues, chest pain, palpitations, edema, persistant/recurrent cough, hemoptysis, dyspnea (rest/exertional/paroxysmal nocturnal), gastrointestinal bleeding (melena, rectal bleeding), abdominal pain, significant heartburn, GU symptoms (dysuria, hematuria, incontinence), Gyn symptoms (abnormal  bleeding, pain),  syncope, focal weakness, memory loss, numbness & tingling, skin/hair/nail changes, abnormal bruising or bleeding, anxiety, or depression.   + IBS    Objective:   Physical Exam General Appearance:    Alert, cooperative, no distress, appears stated age  Head:    Normocephalic, without obvious abnormality, atraumatic  Eyes:    PERRL, conjunctiva/corneas clear, EOM's intact, fundi    benign, both eyes  Ears:    Normal TM's and external ear canals, both ears  Nose:   Nares normal, septum midline, mucosa normal, no drainage    or sinus tenderness  Throat:   Lips, mucosa, and tongue normal; teeth and gums normal  Neck:   Supple, symmetrical, trachea midline, no adenopathy;    Thyroid: no enlargement/tenderness/nodules  Back:     Symmetric, no curvature, ROM normal, no CVA tenderness  Lungs:     Clear to auscultation bilaterally, respirations unlabored  Chest Wall:    No tenderness or deformity   Heart:    Regular rate and rhythm, S1 and S2 normal, no murmur, rub   or gallop  Breast Exam:    Deferred to GYN  Abdomen:     Soft, non-tender, bowel sounds active all four quadrants,    no masses, no organomegaly  Genitalia:    Deferred to GYN  Rectal:    Extremities:   Extremities normal, atraumatic, no  cyanosis or edema  Pulses:   2+ and symmetric all extremities  Skin:   Skin color, texture, turgor normal, no rashes or lesions  Lymph nodes:   Cervical, supraclavicular, and axillary nodes normal  Neurologic:   CNII-XII intact, normal strength, sensation and reflexes    throughout          Assessment & Plan:

## 2016-03-31 NOTE — Progress Notes (Signed)
Pre visit review using our clinic review tool, if applicable. No additional management support is needed unless otherwise documented below in the visit note. 

## 2016-03-31 NOTE — Patient Instructions (Signed)
Follow up in 3-4 months to recheck diabetes We'll notify you of your lab results and make any changes if needed Keep up the good work on healthy diet and regular exercise- you look great! You are up to date on mammo until late October Call with any questions or concerns Thanks for sticking with [place]! Have a great summer!!!

## 2016-04-01 LAB — TSH: TSH: 2.4 u[IU]/mL (ref 0.35–4.50)

## 2016-04-01 LAB — VITAMIN D 25 HYDROXY (VIT D DEFICIENCY, FRACTURES): VITD: 54.16 ng/mL (ref 30.00–100.00)

## 2016-05-16 ENCOUNTER — Other Ambulatory Visit: Payer: Self-pay | Admitting: Family Medicine

## 2016-05-16 NOTE — Telephone Encounter (Signed)
Medication filled to pharmacy as requested.   

## 2016-06-02 ENCOUNTER — Other Ambulatory Visit: Payer: Self-pay | Admitting: Family Medicine

## 2016-06-02 NOTE — Telephone Encounter (Signed)
Medication filled to pharmacy as requested.   

## 2016-07-10 ENCOUNTER — Ambulatory Visit (INDEPENDENT_AMBULATORY_CARE_PROVIDER_SITE_OTHER): Payer: BLUE CROSS/BLUE SHIELD | Admitting: Family Medicine

## 2016-07-10 ENCOUNTER — Encounter: Payer: Self-pay | Admitting: Family Medicine

## 2016-07-10 VITALS — BP 121/78 | HR 90 | Temp 98.8°F | Resp 16 | Ht 60.0 in | Wt 168.2 lb

## 2016-07-10 DIAGNOSIS — Z23 Encounter for immunization: Secondary | ICD-10-CM

## 2016-07-10 DIAGNOSIS — E119 Type 2 diabetes mellitus without complications: Secondary | ICD-10-CM

## 2016-07-10 LAB — BASIC METABOLIC PANEL
BUN: 10 mg/dL (ref 6–23)
CO2: 33 meq/L — AB (ref 19–32)
Calcium: 9.5 mg/dL (ref 8.4–10.5)
Chloride: 102 mEq/L (ref 96–112)
Creatinine, Ser: 0.62 mg/dL (ref 0.40–1.20)
GFR: 131.5 mL/min (ref >60.00)
GLUCOSE: 115 mg/dL — AB (ref 70–99)
POTASSIUM: 4.3 meq/L (ref 3.5–5.1)
SODIUM: 141 meq/L (ref 135–145)

## 2016-07-10 LAB — HEMOGLOBIN A1C: HEMOGLOBIN A1C: 6.8 % — AB (ref 4.6–6.5)

## 2016-07-10 MED ORDER — METFORMIN HCL ER 500 MG PO TB24: 500.0000 mg | ORAL_TABLET | Freq: Two times a day (BID) | ORAL | 1 refills | Status: DC

## 2016-07-10 MED ORDER — RAMIPRIL 2.5 MG PO CAPS: 2.5000 mg | ORAL_CAPSULE | Freq: Every day | ORAL | 1 refills | Status: DC

## 2016-07-10 MED ORDER — ATORVASTATIN CALCIUM 20 MG PO TABS: 20.0000 mg | ORAL_TABLET | Freq: Every day | ORAL | 1 refills | Status: DC

## 2016-07-10 NOTE — Progress Notes (Signed)
   Subjective:    Patient ID: Sonia Little, female    DOB: 1966-09-20, 50 y.o.   MRN: [id number]  HPI DM- chronic problem, on Metformin twice daily.  On ACE for renal protection.  UTD on eye exam (due next month), foot exam due today.  Pt is checking sugars regularly- this AM was 126, last night after dinner was 144.  Denies symptomatic lows.  No dizziness.  No numbness/tingling of hands/feet.  Has eye exam scheduled.  No CP, SOB, HAs, visual changes, abd pain, N/V.   Review of Systems For ROS see HPI     Objective:   Physical Exam  Constitutional: She is oriented to person, place, and time. She appears well-developed and well-nourished. No distress.  HENT:  Head: Normocephalic and atraumatic.  Eyes: Conjunctivae and EOM are normal. Pupils are equal, round, and reactive to light.  Neck: Normal range of motion. Neck supple. No thyromegaly present.  Cardiovascular: Normal rate, regular rhythm, normal heart sounds and intact distal pulses.   No murmur heard. Pulmonary/Chest: Effort normal and breath sounds normal. No respiratory distress.  Abdominal: Soft. She exhibits no distension. There is no tenderness.  Musculoskeletal: She exhibits no edema.  Lymphadenopathy:    She has no cervical adenopathy.  Neurological: She is alert and oriented to person, place, and time.  Skin: Skin is warm and dry.  Psychiatric: She has a normal mood and affect. Her behavior is normal.  Vitals reviewed.         Assessment & Plan:

## 2016-07-10 NOTE — Patient Instructions (Signed)
Follow up in 3-4 months to recheck diabetes and cholesterol We'll notify you of your lab results and make any changes if needed Keep up the good work on healthy diet and regular exercise- you look great!!! Make sure you have your eye exam scheduled for next month Call with any questions or concerns CONGRATS!!!!

## 2016-07-10 NOTE — Progress Notes (Signed)
Pre visit review using our clinic review tool, if applicable. No additional management support is needed unless otherwise documented below in the visit note. 

## 2016-07-10 NOTE — Assessment & Plan Note (Signed)
Pt's home CBGs are currently well controlled.  Asymptomatic.  Eye exam scheduled.  Foot exam done today.  On ACE for renal protection.  Flu shot given.  Check labs.  Adjust meds prn

## 2016-08-05 LAB — HM DIABETES EYE EXAM

## 2016-08-28 ENCOUNTER — Other Ambulatory Visit: Payer: Self-pay | Admitting: Family Medicine

## 2016-08-28 DIAGNOSIS — Z1231 Encounter for screening mammogram for malignant neoplasm of breast: Secondary | ICD-10-CM

## 2016-10-14 ENCOUNTER — Ambulatory Visit
Admission: RE | Admit: 2016-10-14 | Discharge: 2016-10-14 | Disposition: A | Discharge status: Home / Self Care | Payer: BLUE CROSS/BLUE SHIELD | Source: Ambulatory Visit | Attending: Family Medicine | Admitting: Family Medicine

## 2016-10-14 DIAGNOSIS — Z1231 Encounter for screening mammogram for malignant neoplasm of breast: Secondary | ICD-10-CM

## 2016-11-04 ENCOUNTER — Ambulatory Visit (INDEPENDENT_AMBULATORY_CARE_PROVIDER_SITE_OTHER): Payer: BLUE CROSS/BLUE SHIELD | Admitting: Family Medicine

## 2016-11-04 ENCOUNTER — Encounter: Payer: Self-pay | Admitting: Family Medicine

## 2016-11-04 VITALS — BP 120/80 | HR 65 | Temp 98.1°F | Resp 16 | Ht 60.0 in | Wt 169.4 lb

## 2016-11-04 DIAGNOSIS — E119 Type 2 diabetes mellitus without complications: Secondary | ICD-10-CM | POA: Diagnosis not present

## 2016-11-04 DIAGNOSIS — E1169 Type 2 diabetes mellitus with other specified complication: Secondary | ICD-10-CM

## 2016-11-04 DIAGNOSIS — E785 Hyperlipidemia, unspecified: Secondary | ICD-10-CM | POA: Diagnosis not present

## 2016-11-04 LAB — BASIC METABOLIC PANEL
BUN: 11 mg/dL (ref 6–23)
CALCIUM: 9.7 mg/dL (ref 8.4–10.5)
CO2: 31 mEq/L (ref 19–32)
CREATININE: 0.63 mg/dL (ref 0.40–1.20)
Chloride: 103 mEq/L (ref 96–112)
GFR: 128.92 mL/min (ref >60.00)
GLUCOSE: 125 mg/dL — AB (ref 70–99)
Potassium: 4.5 mEq/L (ref 3.5–5.1)
Sodium: 139 mEq/L (ref 135–145)

## 2016-11-04 LAB — CBC WITH DIFFERENTIAL/PLATELET
BASOS ABS: 0 10*3/uL (ref 0.0–0.1)
Basophils Relative: 0.7 % (ref 0.0–3.0)
Eosinophils Absolute: 0.1 10*3/uL (ref 0.0–0.7)
Eosinophils Relative: 1 % (ref 0.0–5.0)
HCT: 39.5 % (ref 36.0–46.0)
HEMOGLOBIN: 13.2 g/dL (ref 12.0–15.0)
LYMPHS ABS: 2.5 10*3/uL (ref 0.7–4.0)
LYMPHS PCT: 50 % — AB (ref 12.0–46.0)
MCHC: 33.4 g/dL (ref 30.0–36.0)
MCV: 93.5 fl (ref 78.0–100.0)
MONOS PCT: 4.9 % (ref 3.0–12.0)
Monocytes Absolute: 0.2 10*3/uL (ref 0.1–1.0)
NEUTROS PCT: 43.4 % (ref 43.0–77.0)
Neutro Abs: 2.2 10*3/uL (ref 1.4–7.7)
Platelets: 319 10*3/uL (ref 150.0–400.0)
RBC: 4.23 Mil/uL (ref 3.87–5.11)
RDW: 13.7 % (ref 11.5–15.5)
WBC: 5.1 10*3/uL (ref 4.0–10.5)

## 2016-11-04 LAB — HEPATIC FUNCTION PANEL
ALBUMIN: 4.6 g/dL (ref 3.5–5.2)
ALT: 19 U/L (ref 0–35)
AST: 12 U/L (ref 0–37)
Alkaline Phosphatase: 48 U/L (ref 39–117)
Bilirubin, Direct: 0.1 mg/dL (ref 0.0–0.3)
Total Bilirubin: 0.6 mg/dL (ref 0.2–1.2)
Total Protein: 6.8 g/dL (ref 6.0–8.3)

## 2016-11-04 LAB — TSH: TSH: 2.64 u[IU]/mL (ref 0.35–4.50)

## 2016-11-04 LAB — LIPID PANEL
CHOL/HDL RATIO: 4
CHOLESTEROL: 188 mg/dL (ref 0–200)
HDL: 52.2 mg/dL (ref >39.00)
LDL Cholesterol: 117 mg/dL — ABNORMAL HIGH (ref 0–99)
NonHDL: 135.69
TRIGLYCERIDES: 91 mg/dL (ref 0.0–149.0)
VLDL: 18.2 mg/dL (ref 0.0–40.0)

## 2016-11-04 LAB — HEMOGLOBIN A1C: Hgb A1c MFr Bld: 6.7 % — ABNORMAL HIGH (ref 4.6–6.5)

## 2016-11-04 NOTE — Assessment & Plan Note (Signed)
Chronic problem.  Tolerating Metformin w/o difficulty.  On ACE for renal protection.  UTD on foot/eye exam.  Asymptomatic.  Home CBGs well controlled.  Check labs.  Adjust meds prn

## 2016-11-04 NOTE — Progress Notes (Signed)
   Subjective:    Patient ID: Sonia Little, female    DOB: 03-20-1966, 50 y.o.   MRN: [id number]  HPI DM- chronic problem, on Metformin twice daily.  On ACE for renal protection.  UTD on foot exam, eye exam (done in Sept).  Home CBG 127 this AM, 119 yesterday.  173 2 hrs after dinner last evening.  Denies symptomatic lows.  No numbness/tingling of hands/feet.  No CP, SOB, HAs, visual changes.   Hyperlipidemia- chronic problem, on Lipitor and fish oil daily.  Exercising regularly.  No abd pain, N/V, myalgias.   Review of Systems For ROS see HPI     Objective:   Physical Exam  Constitutional: She is oriented to person, place, and time. She appears well-developed and well-nourished. No distress.  HENT:  Head: Normocephalic and atraumatic.  Eyes: Conjunctivae and EOM are normal. Pupils are equal, round, and reactive to light.  Neck: Normal range of motion. Neck supple. No thyromegaly present.  Cardiovascular: Normal rate, regular rhythm, normal heart sounds and intact distal pulses.   No murmur heard. Pulmonary/Chest: Effort normal and breath sounds normal. No respiratory distress.  Abdominal: Soft. She exhibits no distension. There is no tenderness.  Musculoskeletal: She exhibits no edema.  Lymphadenopathy:    She has no cervical adenopathy.  Neurological: She is alert and oriented to person, place, and time.  Skin: Skin is warm and dry.  Psychiatric: She has a normal mood and affect. Her behavior is normal.  Vitals reviewed.         Assessment & Plan:

## 2016-11-04 NOTE — Progress Notes (Signed)
Pre visit review using our clinic review tool, if applicable. No additional management support is needed unless otherwise documented below in the visit note. 

## 2016-11-04 NOTE — Patient Instructions (Addendum)
Schedule your complete physical after 5/15 We'll notify you of your lab results and make any changes if needed Continue to work on healthy diet and regular exercise- you look great!!! Call with any questions or concerns ENJOY THE [place] [employer]!!! Happy Holidays!!!

## 2016-11-04 NOTE — Assessment & Plan Note (Signed)
Chronic problem.  Tolerating statin and fish oil w/o difficulty.  Exercising regularly- applauded her efforts.  Check labs.  Adjust meds prn

## 2017-01-23 ENCOUNTER — Other Ambulatory Visit: Payer: Self-pay | Admitting: Family Medicine

## 2017-02-09 DIAGNOSIS — J301 Allergic rhinitis due to pollen: Secondary | ICD-10-CM | POA: Diagnosis not present

## 2017-02-09 DIAGNOSIS — J3081 Allergic rhinitis due to animal (cat) (dog) hair and dander: Secondary | ICD-10-CM | POA: Diagnosis not present

## 2017-02-09 DIAGNOSIS — J3089 Other allergic rhinitis: Secondary | ICD-10-CM | POA: Diagnosis not present

## 2017-02-16 DIAGNOSIS — J3089 Other allergic rhinitis: Secondary | ICD-10-CM | POA: Diagnosis not present

## 2017-02-16 DIAGNOSIS — J3081 Allergic rhinitis due to animal (cat) (dog) hair and dander: Secondary | ICD-10-CM | POA: Diagnosis not present

## 2017-02-16 DIAGNOSIS — J301 Allergic rhinitis due to pollen: Secondary | ICD-10-CM | POA: Diagnosis not present

## 2017-02-24 DIAGNOSIS — J3081 Allergic rhinitis due to animal (cat) (dog) hair and dander: Secondary | ICD-10-CM | POA: Diagnosis not present

## 2017-02-24 DIAGNOSIS — J301 Allergic rhinitis due to pollen: Secondary | ICD-10-CM | POA: Diagnosis not present

## 2017-02-24 DIAGNOSIS — J3089 Other allergic rhinitis: Secondary | ICD-10-CM | POA: Diagnosis not present

## 2017-03-04 DIAGNOSIS — J3089 Other allergic rhinitis: Secondary | ICD-10-CM | POA: Diagnosis not present

## 2017-03-04 DIAGNOSIS — J3081 Allergic rhinitis due to animal (cat) (dog) hair and dander: Secondary | ICD-10-CM | POA: Diagnosis not present

## 2017-03-04 DIAGNOSIS — J301 Allergic rhinitis due to pollen: Secondary | ICD-10-CM | POA: Diagnosis not present

## 2017-03-09 DIAGNOSIS — J301 Allergic rhinitis due to pollen: Secondary | ICD-10-CM | POA: Diagnosis not present

## 2017-03-09 DIAGNOSIS — J3081 Allergic rhinitis due to animal (cat) (dog) hair and dander: Secondary | ICD-10-CM | POA: Diagnosis not present

## 2017-03-09 DIAGNOSIS — J3089 Other allergic rhinitis: Secondary | ICD-10-CM | POA: Diagnosis not present

## 2017-03-17 DIAGNOSIS — J3089 Other allergic rhinitis: Secondary | ICD-10-CM | POA: Diagnosis not present

## 2017-03-17 DIAGNOSIS — J3081 Allergic rhinitis due to animal (cat) (dog) hair and dander: Secondary | ICD-10-CM | POA: Diagnosis not present

## 2017-03-17 DIAGNOSIS — J301 Allergic rhinitis due to pollen: Secondary | ICD-10-CM | POA: Diagnosis not present

## 2017-03-23 DIAGNOSIS — J3081 Allergic rhinitis due to animal (cat) (dog) hair and dander: Secondary | ICD-10-CM | POA: Diagnosis not present

## 2017-03-23 DIAGNOSIS — J301 Allergic rhinitis due to pollen: Secondary | ICD-10-CM | POA: Diagnosis not present

## 2017-03-23 DIAGNOSIS — J3089 Other allergic rhinitis: Secondary | ICD-10-CM | POA: Diagnosis not present

## 2017-03-24 DIAGNOSIS — Z01419 Encounter for gynecological examination (general) (routine) without abnormal findings: Secondary | ICD-10-CM | POA: Diagnosis not present

## 2017-03-24 DIAGNOSIS — Z87898 Personal history of other specified conditions: Secondary | ICD-10-CM | POA: Diagnosis not present

## 2017-03-30 DIAGNOSIS — J301 Allergic rhinitis due to pollen: Secondary | ICD-10-CM | POA: Diagnosis not present

## 2017-03-30 DIAGNOSIS — M5412 Radiculopathy, cervical region: Secondary | ICD-10-CM | POA: Diagnosis not present

## 2017-03-30 DIAGNOSIS — M542 Cervicalgia: Secondary | ICD-10-CM | POA: Diagnosis not present

## 2017-03-30 DIAGNOSIS — J3089 Other allergic rhinitis: Secondary | ICD-10-CM | POA: Diagnosis not present

## 2017-03-30 DIAGNOSIS — M5416 Radiculopathy, lumbar region: Secondary | ICD-10-CM | POA: Diagnosis not present

## 2017-03-30 DIAGNOSIS — J3081 Allergic rhinitis due to animal (cat) (dog) hair and dander: Secondary | ICD-10-CM | POA: Diagnosis not present

## 2017-04-02 ENCOUNTER — Encounter: Payer: BLUE CROSS/BLUE SHIELD | Admitting: Family Medicine

## 2017-04-07 DIAGNOSIS — J301 Allergic rhinitis due to pollen: Secondary | ICD-10-CM | POA: Diagnosis not present

## 2017-04-07 DIAGNOSIS — J3089 Other allergic rhinitis: Secondary | ICD-10-CM | POA: Diagnosis not present

## 2017-04-07 DIAGNOSIS — J3081 Allergic rhinitis due to animal (cat) (dog) hair and dander: Secondary | ICD-10-CM | POA: Diagnosis not present

## 2017-04-14 ENCOUNTER — Encounter: Payer: Self-pay | Admitting: Family Medicine

## 2017-04-14 ENCOUNTER — Ambulatory Visit (INDEPENDENT_AMBULATORY_CARE_PROVIDER_SITE_OTHER): Payer: Commercial Managed Care - PPO | Admitting: Family Medicine

## 2017-04-14 VITALS — BP 122/80 | HR 89 | Temp 98.1°F | Resp 16 | Ht 60.0 in | Wt 167.1 lb

## 2017-04-14 DIAGNOSIS — J301 Allergic rhinitis due to pollen: Secondary | ICD-10-CM | POA: Diagnosis not present

## 2017-04-14 DIAGNOSIS — Z0000 Encounter for general adult medical examination without abnormal findings: Secondary | ICD-10-CM

## 2017-04-14 DIAGNOSIS — J3089 Other allergic rhinitis: Secondary | ICD-10-CM | POA: Diagnosis not present

## 2017-04-14 DIAGNOSIS — E119 Type 2 diabetes mellitus without complications: Secondary | ICD-10-CM | POA: Diagnosis not present

## 2017-04-14 DIAGNOSIS — J3081 Allergic rhinitis due to animal (cat) (dog) hair and dander: Secondary | ICD-10-CM | POA: Diagnosis not present

## 2017-04-14 LAB — CBC WITH DIFFERENTIAL/PLATELET
Basophils Absolute: 0 10*3/uL (ref 0.0–0.1)
Basophils Relative: 0.7 % (ref 0.0–3.0)
EOS ABS: 0.1 10*3/uL (ref 0.0–0.7)
EOS PCT: 1.2 % (ref 0.0–5.0)
HCT: 40.1 % (ref 36.0–46.0)
HEMOGLOBIN: 13.1 g/dL (ref 12.0–15.0)
LYMPHS ABS: 2.5 10*3/uL (ref 0.7–4.0)
Lymphocytes Relative: 53 % — ABNORMAL HIGH (ref 12.0–46.0)
MCHC: 32.8 g/dL (ref 30.0–36.0)
MCV: 94.1 fl (ref 78.0–100.0)
MONO ABS: 0.3 10*3/uL (ref 0.1–1.0)
Monocytes Relative: 5.9 % (ref 3.0–12.0)
NEUTROS PCT: 39.2 % — AB (ref 43.0–77.0)
Neutro Abs: 1.9 10*3/uL (ref 1.4–7.7)
Platelets: 328 10*3/uL (ref 150.0–400.0)
RBC: 4.27 Mil/uL (ref 3.87–5.11)
RDW: 13.5 % (ref 11.5–15.5)
WBC: 4.8 10*3/uL (ref 4.0–10.5)

## 2017-04-14 LAB — BASIC METABOLIC PANEL
BUN: 10 mg/dL (ref 6–23)
CHLORIDE: 104 meq/L (ref 96–112)
CO2: 30 meq/L (ref 19–32)
CREATININE: 0.64 mg/dL (ref 0.40–1.20)
Calcium: 9.7 mg/dL (ref 8.4–10.5)
GFR: 126.37 mL/min (ref >60.00)
Glucose, Bld: 117 mg/dL — ABNORMAL HIGH (ref 70–99)
POTASSIUM: 4.3 meq/L (ref 3.5–5.1)
Sodium: 140 mEq/L (ref 135–145)

## 2017-04-14 LAB — LIPID PANEL
CHOL/HDL RATIO: 4
Cholesterol: 182 mg/dL (ref 0–200)
HDL: 51.2 mg/dL (ref >39.00)
LDL CALC: 114 mg/dL — AB (ref 0–99)
NONHDL: 130.7
Triglycerides: 83 mg/dL (ref 0.0–149.0)
VLDL: 16.6 mg/dL (ref 0.0–40.0)

## 2017-04-14 LAB — HEPATIC FUNCTION PANEL
ALK PHOS: 48 U/L (ref 39–117)
ALT: 18 U/L (ref 0–35)
AST: 14 U/L (ref 0–37)
Albumin: 4.7 g/dL (ref 3.5–5.2)
Bilirubin, Direct: 0.1 mg/dL (ref 0.0–0.3)
Total Bilirubin: 0.5 mg/dL (ref 0.2–1.2)
Total Protein: 7.1 g/dL (ref 6.0–8.3)

## 2017-04-14 LAB — TSH: TSH: 2.16 u[IU]/mL (ref 0.35–4.50)

## 2017-04-14 LAB — HEMOGLOBIN A1C: HEMOGLOBIN A1C: 7 % — AB (ref 4.6–6.5)

## 2017-04-14 LAB — VITAMIN D 25 HYDROXY (VIT D DEFICIENCY, FRACTURES): VITD: 48.12 ng/mL (ref 30.00–100.00)

## 2017-04-14 NOTE — Progress Notes (Signed)
   Subjective:    Patient ID: Sonia Little, female    DOB: June 21, 1966, 51 y.o.   MRN: [id number]  HPI CPE- UTD on mammo.  No need for pap due to hysterectomy.  Due for colonoscopy.  UTD on eye exam, foot exam.  On ACE for renal protection.   Review of Systems Patient reports no vision/ hearing changes, adenopathy,fever, weight change,  persistant/recurrent hoarseness , swallowing issues, chest pain, palpitations, edema, persistant/recurrent cough, hemoptysis, dyspnea (rest/exertional/paroxysmal nocturnal), gastrointestinal bleeding (melena, rectal bleeding), abdominal pain, significant heartburn, bowel changes, GU symptoms (dysuria, hematuria, incontinence), Gyn symptoms (abnormal  bleeding, pain),  syncope, focal weakness, memory loss, numbness & tingling, skin/hair/nail changes, abnormal bruising or bleeding, anxiety, or depression.     Objective:   Physical Exam General Appearance:    Alert, cooperative, no distress, appears stated age  Head:    Normocephalic, without obvious abnormality, atraumatic  Eyes:    PERRL, conjunctiva/corneas clear, EOM's intact, fundi    benign, both eyes  Ears:    Normal TM's and external ear canals, both ears  Nose:   Nares normal, septum midline, mucosa normal, no drainage    or sinus tenderness  Throat:   Lips, mucosa, and tongue normal; teeth and gums normal  Neck:   Supple, symmetrical, trachea midline, no adenopathy;    Thyroid: mild thyromegaly  Back:     Symmetric, no curvature, ROM normal, no CVA tenderness  Lungs:     Clear to auscultation bilaterally, respirations unlabored  Chest Wall:    No tenderness or deformity   Heart:    Regular rate and rhythm, S1 and S2 normal, no murmur, rub   or gallop  Breast Exam:    Deferred to mammo  Abdomen:     Soft, non-tender, bowel sounds active all four quadrants,    no masses, no organomegaly  Genitalia:    Deferred  Rectal:    Extremities:   Extremities normal, atraumatic, no cyanosis or edema    Pulses:   2+ and symmetric all extremities  Skin:   Skin color, texture, turgor normal, no rashes or lesions  Lymph nodes:   Cervical, supraclavicular, and axillary nodes normal  Neurologic:   CNII-XII intact, normal strength, sensation and reflexes    throughout          Assessment & Plan:

## 2017-04-14 NOTE — Progress Notes (Signed)
Pre visit review using our clinic review tool, if applicable. No additional management support is needed unless otherwise documented below in the visit note. 

## 2017-04-14 NOTE — Patient Instructions (Signed)
Follow up in 3-4 months to recheck diabetes We'll notify you of your lab results and make any changes if needed Keep up the good work on healthy diet and regular exercise- you look great! Call with any questions or concerns Hang in there!!!  You can do this!!!

## 2017-04-14 NOTE — Assessment & Plan Note (Signed)
Pt's PE WNL w/ exception of known mild thyromegaly.  UTD on mammo, too young for colonoscopy.  UTD on eye exam, Tdap.  Check labs.  Anticipatory guidance provided.

## 2017-04-14 NOTE — Assessment & Plan Note (Signed)
Chronic problem.  Tolerating Metformin twice daily.  UTD on eye exam, foot exam done today.  On ACE for renal protection.  Check labs.  Adjust meds prn

## 2017-04-20 DIAGNOSIS — M5416 Radiculopathy, lumbar region: Secondary | ICD-10-CM | POA: Diagnosis not present

## 2017-04-20 DIAGNOSIS — M5412 Radiculopathy, cervical region: Secondary | ICD-10-CM | POA: Diagnosis not present

## 2017-04-20 DIAGNOSIS — M542 Cervicalgia: Secondary | ICD-10-CM | POA: Diagnosis not present

## 2017-04-21 DIAGNOSIS — J3081 Allergic rhinitis due to animal (cat) (dog) hair and dander: Secondary | ICD-10-CM | POA: Diagnosis not present

## 2017-04-21 DIAGNOSIS — J301 Allergic rhinitis due to pollen: Secondary | ICD-10-CM | POA: Diagnosis not present

## 2017-04-21 DIAGNOSIS — J3089 Other allergic rhinitis: Secondary | ICD-10-CM | POA: Diagnosis not present

## 2017-04-28 ENCOUNTER — Other Ambulatory Visit: Payer: Self-pay | Admitting: General Practice

## 2017-04-28 DIAGNOSIS — J301 Allergic rhinitis due to pollen: Secondary | ICD-10-CM | POA: Diagnosis not present

## 2017-04-28 DIAGNOSIS — J3089 Other allergic rhinitis: Secondary | ICD-10-CM | POA: Diagnosis not present

## 2017-04-28 DIAGNOSIS — J3081 Allergic rhinitis due to animal (cat) (dog) hair and dander: Secondary | ICD-10-CM | POA: Diagnosis not present

## 2017-04-28 MED ORDER — RAMIPRIL 2.5 MG PO CAPS: 2.5000 mg | ORAL_CAPSULE | Freq: Every day | ORAL | 1 refills | Status: DC

## 2017-04-28 MED ORDER — METFORMIN HCL ER 500 MG PO TB24: 500.0000 mg | ORAL_TABLET | Freq: Two times a day (BID) | ORAL | 1 refills | Status: DC

## 2017-05-04 DIAGNOSIS — J3089 Other allergic rhinitis: Secondary | ICD-10-CM | POA: Diagnosis not present

## 2017-05-04 DIAGNOSIS — D2371 Other benign neoplasm of skin of right lower limb, including hip: Secondary | ICD-10-CM | POA: Diagnosis not present

## 2017-05-04 DIAGNOSIS — L219 Seborrheic dermatitis, unspecified: Secondary | ICD-10-CM | POA: Diagnosis not present

## 2017-05-04 DIAGNOSIS — L918 Other hypertrophic disorders of the skin: Secondary | ICD-10-CM | POA: Diagnosis not present

## 2017-05-04 DIAGNOSIS — J3081 Allergic rhinitis due to animal (cat) (dog) hair and dander: Secondary | ICD-10-CM | POA: Diagnosis not present

## 2017-05-04 DIAGNOSIS — J301 Allergic rhinitis due to pollen: Secondary | ICD-10-CM | POA: Diagnosis not present

## 2017-05-13 DIAGNOSIS — J3089 Other allergic rhinitis: Secondary | ICD-10-CM | POA: Diagnosis not present

## 2017-05-13 DIAGNOSIS — J301 Allergic rhinitis due to pollen: Secondary | ICD-10-CM | POA: Diagnosis not present

## 2017-05-19 DIAGNOSIS — J3089 Other allergic rhinitis: Secondary | ICD-10-CM | POA: Diagnosis not present

## 2017-05-19 DIAGNOSIS — J301 Allergic rhinitis due to pollen: Secondary | ICD-10-CM | POA: Diagnosis not present

## 2017-05-19 DIAGNOSIS — J3081 Allergic rhinitis due to animal (cat) (dog) hair and dander: Secondary | ICD-10-CM | POA: Diagnosis not present

## 2017-05-25 DIAGNOSIS — J301 Allergic rhinitis due to pollen: Secondary | ICD-10-CM | POA: Diagnosis not present

## 2017-05-25 DIAGNOSIS — J3081 Allergic rhinitis due to animal (cat) (dog) hair and dander: Secondary | ICD-10-CM | POA: Diagnosis not present

## 2017-05-25 DIAGNOSIS — J3089 Other allergic rhinitis: Secondary | ICD-10-CM | POA: Diagnosis not present

## 2017-05-28 DIAGNOSIS — J3089 Other allergic rhinitis: Secondary | ICD-10-CM | POA: Diagnosis not present

## 2017-05-28 DIAGNOSIS — J3081 Allergic rhinitis due to animal (cat) (dog) hair and dander: Secondary | ICD-10-CM | POA: Diagnosis not present

## 2017-06-03 DIAGNOSIS — J301 Allergic rhinitis due to pollen: Secondary | ICD-10-CM | POA: Diagnosis not present

## 2017-06-03 DIAGNOSIS — J3081 Allergic rhinitis due to animal (cat) (dog) hair and dander: Secondary | ICD-10-CM | POA: Diagnosis not present

## 2017-06-03 DIAGNOSIS — J3089 Other allergic rhinitis: Secondary | ICD-10-CM | POA: Diagnosis not present

## 2017-06-08 DIAGNOSIS — J3081 Allergic rhinitis due to animal (cat) (dog) hair and dander: Secondary | ICD-10-CM | POA: Diagnosis not present

## 2017-06-08 DIAGNOSIS — J301 Allergic rhinitis due to pollen: Secondary | ICD-10-CM | POA: Diagnosis not present

## 2017-06-08 DIAGNOSIS — J3089 Other allergic rhinitis: Secondary | ICD-10-CM | POA: Diagnosis not present

## 2017-06-15 DIAGNOSIS — J301 Allergic rhinitis due to pollen: Secondary | ICD-10-CM | POA: Diagnosis not present

## 2017-06-15 DIAGNOSIS — J3089 Other allergic rhinitis: Secondary | ICD-10-CM | POA: Diagnosis not present

## 2017-06-15 DIAGNOSIS — J3081 Allergic rhinitis due to animal (cat) (dog) hair and dander: Secondary | ICD-10-CM | POA: Diagnosis not present

## 2017-06-16 DIAGNOSIS — J301 Allergic rhinitis due to pollen: Secondary | ICD-10-CM | POA: Diagnosis not present

## 2017-06-22 DIAGNOSIS — J3089 Other allergic rhinitis: Secondary | ICD-10-CM | POA: Diagnosis not present

## 2017-06-22 DIAGNOSIS — J301 Allergic rhinitis due to pollen: Secondary | ICD-10-CM | POA: Diagnosis not present

## 2017-06-22 DIAGNOSIS — J3081 Allergic rhinitis due to animal (cat) (dog) hair and dander: Secondary | ICD-10-CM | POA: Diagnosis not present

## 2017-07-02 DIAGNOSIS — J3089 Other allergic rhinitis: Secondary | ICD-10-CM | POA: Diagnosis not present

## 2017-07-02 DIAGNOSIS — J301 Allergic rhinitis due to pollen: Secondary | ICD-10-CM | POA: Diagnosis not present

## 2017-07-02 DIAGNOSIS — J3081 Allergic rhinitis due to animal (cat) (dog) hair and dander: Secondary | ICD-10-CM | POA: Diagnosis not present

## 2017-07-14 DIAGNOSIS — J3089 Other allergic rhinitis: Secondary | ICD-10-CM | POA: Diagnosis not present

## 2017-07-14 DIAGNOSIS — J3081 Allergic rhinitis due to animal (cat) (dog) hair and dander: Secondary | ICD-10-CM | POA: Diagnosis not present

## 2017-07-14 DIAGNOSIS — J301 Allergic rhinitis due to pollen: Secondary | ICD-10-CM | POA: Diagnosis not present

## 2017-07-21 DIAGNOSIS — J3089 Other allergic rhinitis: Secondary | ICD-10-CM | POA: Diagnosis not present

## 2017-07-21 DIAGNOSIS — J3081 Allergic rhinitis due to animal (cat) (dog) hair and dander: Secondary | ICD-10-CM | POA: Diagnosis not present

## 2017-07-21 DIAGNOSIS — J301 Allergic rhinitis due to pollen: Secondary | ICD-10-CM | POA: Diagnosis not present

## 2017-07-27 ENCOUNTER — Other Ambulatory Visit: Payer: Self-pay | Admitting: Family Medicine

## 2017-07-28 DIAGNOSIS — J3081 Allergic rhinitis due to animal (cat) (dog) hair and dander: Secondary | ICD-10-CM | POA: Diagnosis not present

## 2017-07-28 DIAGNOSIS — J301 Allergic rhinitis due to pollen: Secondary | ICD-10-CM | POA: Diagnosis not present

## 2017-07-28 DIAGNOSIS — J3089 Other allergic rhinitis: Secondary | ICD-10-CM | POA: Diagnosis not present

## 2017-08-04 DIAGNOSIS — J301 Allergic rhinitis due to pollen: Secondary | ICD-10-CM | POA: Diagnosis not present

## 2017-08-04 DIAGNOSIS — J3081 Allergic rhinitis due to animal (cat) (dog) hair and dander: Secondary | ICD-10-CM | POA: Diagnosis not present

## 2017-08-04 DIAGNOSIS — J4520 Mild intermittent asthma, uncomplicated: Secondary | ICD-10-CM | POA: Diagnosis not present

## 2017-08-04 DIAGNOSIS — J3089 Other allergic rhinitis: Secondary | ICD-10-CM | POA: Diagnosis not present

## 2017-08-04 DIAGNOSIS — H1045 Other chronic allergic conjunctivitis: Secondary | ICD-10-CM | POA: Diagnosis not present

## 2017-08-10 DIAGNOSIS — J3081 Allergic rhinitis due to animal (cat) (dog) hair and dander: Secondary | ICD-10-CM | POA: Diagnosis not present

## 2017-08-10 DIAGNOSIS — J3089 Other allergic rhinitis: Secondary | ICD-10-CM | POA: Diagnosis not present

## 2017-08-10 DIAGNOSIS — J301 Allergic rhinitis due to pollen: Secondary | ICD-10-CM | POA: Diagnosis not present

## 2017-08-13 ENCOUNTER — Emergency Department (HOSPITAL_COMMUNITY)
Admission: EM | Admit: 2017-08-13 | Discharge: 2017-08-13 | Disposition: A | Discharge status: Home / Self Care | Payer: Commercial Managed Care - PPO | Attending: Emergency Medicine | Admitting: Emergency Medicine

## 2017-08-13 ENCOUNTER — Emergency Department (HOSPITAL_COMMUNITY): Payer: Commercial Managed Care - PPO

## 2017-08-13 ENCOUNTER — Encounter (HOSPITAL_COMMUNITY): Payer: Self-pay | Admitting: Emergency Medicine

## 2017-08-13 DIAGNOSIS — I10 Essential (primary) hypertension: Secondary | ICD-10-CM | POA: Diagnosis not present

## 2017-08-13 DIAGNOSIS — Z7984 Long term (current) use of oral hypoglycemic drugs: Secondary | ICD-10-CM | POA: Insufficient documentation

## 2017-08-13 DIAGNOSIS — R197 Diarrhea, unspecified: Secondary | ICD-10-CM | POA: Diagnosis not present

## 2017-08-13 DIAGNOSIS — R1031 Right lower quadrant pain: Secondary | ICD-10-CM | POA: Diagnosis present

## 2017-08-13 DIAGNOSIS — R1110 Vomiting, unspecified: Secondary | ICD-10-CM | POA: Diagnosis not present

## 2017-08-13 DIAGNOSIS — N200 Calculus of kidney: Secondary | ICD-10-CM

## 2017-08-13 DIAGNOSIS — Z79899 Other long term (current) drug therapy: Secondary | ICD-10-CM | POA: Diagnosis not present

## 2017-08-13 DIAGNOSIS — E785 Hyperlipidemia, unspecified: Secondary | ICD-10-CM | POA: Insufficient documentation

## 2017-08-13 DIAGNOSIS — E119 Type 2 diabetes mellitus without complications: Secondary | ICD-10-CM | POA: Insufficient documentation

## 2017-08-13 DIAGNOSIS — N132 Hydronephrosis with renal and ureteral calculous obstruction: Secondary | ICD-10-CM | POA: Diagnosis not present

## 2017-08-13 LAB — COMPREHENSIVE METABOLIC PANEL
ALBUMIN: 4.1 g/dL (ref 3.5–5.0)
ALT: 24 U/L (ref 14–54)
ANION GAP: 12 (ref 5–15)
AST: 22 U/L (ref 15–41)
Alkaline Phosphatase: 45 U/L (ref 38–126)
BUN: 9 mg/dL (ref 6–20)
CHLORIDE: 103 mmol/L (ref 101–111)
CO2: 23 mmol/L (ref 22–32)
Calcium: 9.6 mg/dL (ref 8.9–10.3)
Creatinine, Ser: 0.66 mg/dL (ref 0.44–1.00)
GFR calc Af Amer: >60 mL/min (ref >60)
GFR calc non Af Amer: >60 mL/min (ref >60)
GLUCOSE: 215 mg/dL — AB (ref 65–99)
POTASSIUM: 3.4 mmol/L — AB (ref 3.5–5.1)
Sodium: 138 mmol/L (ref 135–145)
Total Bilirubin: 0.7 mg/dL (ref 0.3–1.2)
Total Protein: 7.1 g/dL (ref 6.5–8.1)

## 2017-08-13 LAB — CBC
HEMATOCRIT: 38.1 % (ref 36.0–46.0)
HEMOGLOBIN: 12.5 g/dL (ref 12.0–15.0)
MCH: 30.5 pg (ref 26.0–34.0)
MCHC: 32.8 g/dL (ref 30.0–36.0)
MCV: 92.9 fL (ref 78.0–100.0)
Platelets: 295 10*3/uL (ref 150–400)
RBC: 4.1 MIL/uL (ref 3.87–5.11)
RDW: 13.4 % (ref 11.5–15.5)
WBC: 11 10*3/uL — ABNORMAL HIGH (ref 4.0–10.5)

## 2017-08-13 LAB — URINALYSIS, ROUTINE W REFLEX MICROSCOPIC
BILIRUBIN URINE: NEGATIVE
Glucose, UA: >=500 mg/dL — AB
HGB URINE DIPSTICK: NEGATIVE
Ketones, ur: NEGATIVE mg/dL
LEUKOCYTES UA: NEGATIVE
NITRITE: NEGATIVE
PROTEIN: 30 mg/dL — AB
Specific Gravity, Urine: 1.028 (ref 1.005–1.030)
pH: 6 (ref 5.0–8.0)

## 2017-08-13 LAB — LIPASE, BLOOD: LIPASE: 23 U/L (ref 11–51)

## 2017-08-13 MED ORDER — ONDANSETRON 4 MG PO TBDP
ORAL_TABLET | ORAL | Status: AC
  Administered 2017-08-13: 4 mg
  Filled 2017-08-13: qty 2

## 2017-08-13 MED ORDER — ONDANSETRON HCL 4 MG PO TABS: 4.0000 mg | ORAL_TABLET | Freq: Three times a day (TID) | ORAL | 0 refills | Status: DC | PRN

## 2017-08-13 MED ORDER — ONDANSETRON 4 MG PO TBDP
8.0000 mg | ORAL_TABLET | Freq: Once | ORAL | Status: AC
  Administered 2017-08-13: 8 mg via ORAL

## 2017-08-13 MED ORDER — HYDROCODONE-ACETAMINOPHEN 5-325 MG PO TABS: 1.0000 | ORAL_TABLET | Freq: Four times a day (QID) | ORAL | 0 refills | Status: DC | PRN

## 2017-08-13 MED ORDER — ONDANSETRON HCL 4 MG/2ML IJ SOLN
4.0000 mg | Freq: Once | INTRAMUSCULAR | Status: AC
  Administered 2017-08-13: 4 mg via INTRAVENOUS
  Filled 2017-08-13: qty 2

## 2017-08-13 MED ORDER — MORPHINE SULFATE (PF) 4 MG/ML IV SOLN
4.0000 mg | Freq: Once | INTRAVENOUS | Status: AC
  Administered 2017-08-13: 4 mg via INTRAVENOUS
  Filled 2017-08-13: qty 1

## 2017-08-13 MED ORDER — FENTANYL CITRATE (PF) 100 MCG/2ML IJ SOLN
50.0000 ug | Freq: Once | INTRAMUSCULAR | Status: AC
  Administered 2017-08-13: 50 ug via INTRAVENOUS
  Filled 2017-08-13: qty 2

## 2017-08-13 MED ORDER — KETOROLAC TROMETHAMINE 30 MG/ML IJ SOLN
30.0000 mg | Freq: Once | INTRAMUSCULAR | Status: AC
  Administered 2017-08-13: 30 mg via INTRAVENOUS
  Filled 2017-08-13: qty 1

## 2017-08-13 MED ORDER — SODIUM CHLORIDE 0.9 % IV BOLUS (SEPSIS)
1000.0000 mL | Freq: Once | INTRAVENOUS | Status: AC
  Administered 2017-08-13: 1000 mL via INTRAVENOUS

## 2017-08-13 MED ORDER — TAMSULOSIN HCL 0.4 MG PO CAPS: 0.4000 mg | ORAL_CAPSULE | Freq: Every day | ORAL | 0 refills | Status: DC

## 2017-08-13 MED ORDER — IOPAMIDOL (ISOVUE-300) INJECTION 61%
INTRAVENOUS | Status: AC
  Administered 2017-08-13: 100 mL
  Filled 2017-08-13: qty 100

## 2017-08-13 NOTE — ED Notes (Signed)
Pt.gotten sick while using the throe up muitiple times .nurse is awear.of it.

## 2017-08-13 NOTE — ED Notes (Signed)
Pt ambulatory to lobby with family.  nad noted.  Vss.  There are no further needs.

## 2017-08-13 NOTE — ED Triage Notes (Signed)
Patient reports RLQ pain with emesis and diarrhea onset this morning , denies fever or chills .

## 2017-08-13 NOTE — Discharge Instructions (Signed)
You have a 41mm kidney stone on the right side.  Please use urine strainer to help capture the stone and bring it to urologist for further evaluation.  Return if you have any concerns.

## 2017-08-13 NOTE — ED Provider Notes (Signed)
[facility] DEPT Provider Note   CSN: [id number] Arrival date & time: 08/13/17  0401     History   Chief Complaint Chief Complaint  Patient presents with  . Abdominal Pain    HPI Sonia Bohle is a 51 y.o. female.  HPI   52 year old female with history of diabetes, hypertension, hyperlipidemia presenting for evaluation of abdominal pain. Patient report this morning she woke up around 1 AM to use the bathroom to urinate. After urination, she notice pain to her right lower abdomen. Pain is gradual in onset, achy and radiates towards her back. She felt nauseous, vomited multiple times of nonbloody nonbilious content and also report having some loose stools later on. She immediately came to the ER. She did receive some pain medication while waiting and her pain has since subsided. Currently her pain is minimal. She denies any associated fever, chills, lightheadedness, dizziness, chest pain, shortness of breath, productive cough, dysuria, hematuria. She had total hysterectomy in the past, she still has intact appendix. Denies having any gallbladder problem.  Past Medical History:  Diagnosis Date  . CTS (carpal tunnel syndrome)   . Exophthalmos, unspecified   . Hypertension   . Other and unspecified hyperlipidemia   . Type II or unspecified type diabetes mellitus without mention of complication, not stated as uncontrolled     Patient Active Problem List   Diagnosis Date Noted  . Physical exam 01/16/2015  . Thyromegaly 09/15/2014  . Hypopotassemia 03/30/2013  . Elevated blood pressure reading without diagnosis of hypertension 03/18/2012  . Diabetes mellitus type II, controlled, with no complications ([place]) 29/92/4268  . CARPAL TUNNEL SYNDROME 03/30/2008  . Hyperlipidemia associated with type 2 diabetes mellitus ([place]) 05/04/2007  . EXOPHTHALMOS NOS 04/02/2007    Past Surgical History:  Procedure Laterality Date  . ABDOMINAL HYSTERECTOMY  2011   For fibroids with pelvic pain     . TONSILLECTOMY    . WISDOM TOOTH EXTRACTION      OB History    No data available       Home Medications    Prior to Admission medications   Medication Sig Start Date End Date Taking? Authorizing Provider  atorvastatin (LIPITOR) 20 MG tablet TAKE 1 TABLET EVERY DAY 07/27/17   Midge Minium, MD  cetirizine (ZYRTEC) 10 MG tablet Take 10 mg by mouth daily.    [provider]  fish oil-omega-3 fatty acids 1000 MG capsule Take 2 g by mouth daily.      [provider]  FLUOCINOLONE ACETONIDE SCALP 0.01 % OIL Pt uses oil on scalp once a week. 08/04/13   [provider]  meclizine (ANTIVERT) 50 MG tablet Take 1 tablet (50 mg total) by mouth 3 (three) times daily as needed. 08/21/15   Varney Biles, MD  metFORMIN (GLUCOPHAGE-XR) 500 MG 24 hr tablet Take 1 tablet (500 mg total) by mouth 2 (two) times daily. 04/28/17   Midge Minium, MD  Multiple Vitamin (MULTIVITAMIN) tablet Take 1 tablet by mouth daily. Diabetic combo pak     [provider]  nystatin-triamcinolone ointment (MYCOLOG)  02/15/16   [provider]  PROAIR HFA 108 (90 BASE) MCG/ACT inhaler Inhale 1 puff into the lungs as needed. 08/03/13   [provider]  ramipril (ALTACE) 2.5 MG capsule Take 1 capsule (2.5 mg total) by mouth daily. 04/28/17   Midge Minium, MD    Family History Family History  Problem Relation Age of Onset  . Hypertension Father   . Diabetes  Father   . Heart failure Father   . Diabetes Mother   . Hypertension Mother   . Hypertension Sister   . Hypertension Brother   . Diabetes Maternal Aunt   . Diabetes Maternal Uncle     Social History Social History  Substance Use Topics  . Smoking status: Never Smoker  . Smokeless tobacco: Never Used  . Alcohol use No     Allergies   Aspirin and Simvastatin   Review of Systems Review of Systems  All other systems reviewed and are negative.    Physical Exam Updated Vital Signs BP  (!) 146/90   Pulse 62   Temp (!) 97.5 F (36.4 C) (Oral)   Resp 20   SpO2 97%   Physical Exam  Constitutional: She appears well-developed and well-nourished. No distress.  HENT:  Head: Atraumatic.  Eyes: Conjunctivae are normal.  Neck: Neck supple.  Cardiovascular: Normal rate and regular rhythm.   Pulmonary/Chest: Effort normal and breath sounds normal. She has no rales. She exhibits no tenderness.  Abdominal: Soft. Bowel sounds are normal. She exhibits no distension. There is no tenderness.  Abdomen is soft and nontender, negative Murphy sign, no pain at McBurney's point. Bowel sounds present  Neurological: She is alert.  Skin: No rash noted.  Psychiatric: She has a normal mood and affect.  Nursing note and vitals reviewed.    ED Treatments / Results  Labs (all labs ordered are listed, but only abnormal results are displayed) Labs Reviewed  COMPREHENSIVE METABOLIC PANEL - Abnormal; Notable for the following:       Result Value   Potassium 3.4 (*)    Glucose, Bld 215 (*)    All other components within normal limits  CBC - Abnormal; Notable for the following:    WBC 11.0 (*)    All other components within normal limits  URINALYSIS, ROUTINE W REFLEX MICROSCOPIC - Abnormal; Notable for the following:    APPearance HAZY (*)    Glucose, UA >=500 (*)    Protein, ur 30 (*)    Bacteria, UA RARE (*)    Squamous Epithelial / LPF 0-5 (*)    All other components within normal limits  LIPASE, BLOOD    EKG  EKG Interpretation Sonia Little       Radiology Ct Abdomen Pelvis W Contrast  Result Date: 08/13/2017 CLINICAL DATA:  Right lower quadrant pain, vomiting, and diarrhea beginning this morning. EXAM: CT ABDOMEN AND PELVIS WITH CONTRAST TECHNIQUE: Multidetector CT imaging of the abdomen and pelvis was performed using the standard protocol following bolus administration of intravenous contrast. CONTRAST:  145mL ISOVUE-300 IOPAMIDOL (ISOVUE-300) INJECTION 61% COMPARISON:  Sonia Little.  FINDINGS: Lower Chest: Mild bibasilar atelectasis. Hepatobiliary: No hepatic masses identified. Gallbladder is unremarkable. Pancreas:  No mass or inflammatory changes. Spleen: Within normal limits in size and appearance. Adrenals/Urinary Tract: Mild right hydroureteronephrosis is seen due to a 2 mm calculus at the right ureterovesical junction. Mild right perinephric stranding and fluid also seen. Delayed contrast excretion by the right kidney is seen, but no renal parenchymal lesions identified. Stomach/Bowel: No evidence of obstruction, inflammatory process or abnormal fluid collections. Vascular/Lymphatic: No pathologically enlarged lymph nodes. No abdominal aortic aneurysm. Reproductive: Prior hysterectomy noted. Adnexal regions are unremarkable in appearance. Other:  Sonia Little. Musculoskeletal:  No suspicious bone lesions identified. IMPRESSION: Mild right hydroureteronephrosis and perinephric fluid due to 2 mm calculus at the right ureterovesical junction. Electronically Signed   By: Earle Gell M.D.   On: 08/13/2017 10:45  Procedures Procedures (including critical care time)  Medications Ordered in ED Medications  ondansetron (ZOFRAN-ODT) 4 MG disintegrating tablet (not administered)  ondansetron (ZOFRAN-ODT) disintegrating tablet 8 mg (8 mg Oral Given 08/13/17 0427)  fentaNYL (SUBLIMAZE) injection 50 mcg (50 mcg Intravenous Given 08/13/17 0539)  ondansetron (ZOFRAN) injection 4 mg (4 mg Intravenous Given 08/13/17 0558)     Initial Impression / Assessment and Plan / ED Course  I have reviewed the triage vital signs and the nursing notes.  Pertinent labs & imaging results that were available during my care of the patient were reviewed by me and considered in my medical decision making (see chart for details).     BP 130/74   Pulse 62   Temp (!) 97.5 F (36.4 C) (Oral)   Resp 16   SpO2 93%    Final Clinical Impressions(s) / ED Diagnoses   Final diagnoses:  Kidney stone on right side      New Prescriptions New Prescriptions   HYDROCODONE-ACETAMINOPHEN (NORCO/VICODIN) 5-325 MG TABLET    Take 1 tablet by mouth every 6 (six) hours as needed for moderate pain.   ONDANSETRON (ZOFRAN) 4 MG TABLET    Take 1 tablet (4 mg total) by mouth every 8 (eight) hours as needed for nausea or vomiting.   TAMSULOSIN (FLOMAX) 0.4 MG CAPS CAPSULE    Take 1 capsule (0.4 mg total) by mouth daily.   6:17 AM Patient here with right lower quadrant abdominal pain with associate nausea vomiting diarrhea that started early this morning. She is currently resting comfortably, well-appearing, no reproducible abdominal pain on exam. By suspicion for appendicitis is low. After suspicion for biliary disease. Her labs are reassuring. Mild hyperglycemia with a CBG of 215 and normal anion gap. We'll continue with symptomatic immediate treatment and reassessment.  8:41 AM Pain is returning, patient appears more uncomfortable. She will receive pain medication, abdominal and pelvis CT scan ordered.  10:50 AM Abd/pelvis CT demonstrates mild R hydroureteronephrosis and perinephric fluid due to a 71mm calculus at the right UVJ.  No other concerning feature.  Will give additional pain management.  Pt d/c home with pain medication, Flomax and outpt f/u with urology for further care. Return precaution discussed.  UA without UTI.     Domenic Moras, PA-C 08/13/17 1134    Ripley Fraise, MD 08/14/17 1336

## 2017-08-14 DIAGNOSIS — R109 Unspecified abdominal pain: Secondary | ICD-10-CM | POA: Diagnosis not present

## 2017-08-20 DIAGNOSIS — J3089 Other allergic rhinitis: Secondary | ICD-10-CM | POA: Diagnosis not present

## 2017-08-20 DIAGNOSIS — J301 Allergic rhinitis due to pollen: Secondary | ICD-10-CM | POA: Diagnosis not present

## 2017-08-20 DIAGNOSIS — J3081 Allergic rhinitis due to animal (cat) (dog) hair and dander: Secondary | ICD-10-CM | POA: Diagnosis not present

## 2017-08-25 DIAGNOSIS — J3089 Other allergic rhinitis: Secondary | ICD-10-CM | POA: Diagnosis not present

## 2017-08-25 DIAGNOSIS — J301 Allergic rhinitis due to pollen: Secondary | ICD-10-CM | POA: Diagnosis not present

## 2017-08-25 DIAGNOSIS — J3081 Allergic rhinitis due to animal (cat) (dog) hair and dander: Secondary | ICD-10-CM | POA: Diagnosis not present

## 2017-08-25 DIAGNOSIS — Z23 Encounter for immunization: Secondary | ICD-10-CM | POA: Diagnosis not present

## 2017-08-27 DIAGNOSIS — H2513 Age-related nuclear cataract, bilateral: Secondary | ICD-10-CM | POA: Diagnosis not present

## 2017-08-27 DIAGNOSIS — H0520 Unspecified exophthalmos: Secondary | ICD-10-CM | POA: Diagnosis not present

## 2017-08-27 DIAGNOSIS — E119 Type 2 diabetes mellitus without complications: Secondary | ICD-10-CM | POA: Diagnosis not present

## 2017-08-27 LAB — HM DIABETES EYE EXAM

## 2017-08-31 ENCOUNTER — Other Ambulatory Visit: Payer: Self-pay | Admitting: Family Medicine

## 2017-08-31 DIAGNOSIS — Z1231 Encounter for screening mammogram for malignant neoplasm of breast: Secondary | ICD-10-CM

## 2017-08-31 DIAGNOSIS — J3081 Allergic rhinitis due to animal (cat) (dog) hair and dander: Secondary | ICD-10-CM | POA: Diagnosis not present

## 2017-08-31 DIAGNOSIS — J3089 Other allergic rhinitis: Secondary | ICD-10-CM | POA: Diagnosis not present

## 2017-08-31 DIAGNOSIS — J301 Allergic rhinitis due to pollen: Secondary | ICD-10-CM | POA: Diagnosis not present

## 2017-09-01 ENCOUNTER — Encounter: Payer: Self-pay | Admitting: General Practice

## 2017-09-03 DIAGNOSIS — N201 Calculus of ureter: Secondary | ICD-10-CM | POA: Diagnosis not present

## 2017-09-07 DIAGNOSIS — J301 Allergic rhinitis due to pollen: Secondary | ICD-10-CM | POA: Diagnosis not present

## 2017-09-07 DIAGNOSIS — J3089 Other allergic rhinitis: Secondary | ICD-10-CM | POA: Diagnosis not present

## 2017-09-07 DIAGNOSIS — J3081 Allergic rhinitis due to animal (cat) (dog) hair and dander: Secondary | ICD-10-CM | POA: Diagnosis not present

## 2017-09-14 DIAGNOSIS — J301 Allergic rhinitis due to pollen: Secondary | ICD-10-CM | POA: Diagnosis not present

## 2017-09-14 DIAGNOSIS — J3089 Other allergic rhinitis: Secondary | ICD-10-CM | POA: Diagnosis not present

## 2017-09-14 DIAGNOSIS — J3081 Allergic rhinitis due to animal (cat) (dog) hair and dander: Secondary | ICD-10-CM | POA: Diagnosis not present

## 2017-09-21 DIAGNOSIS — J3081 Allergic rhinitis due to animal (cat) (dog) hair and dander: Secondary | ICD-10-CM | POA: Diagnosis not present

## 2017-09-21 DIAGNOSIS — J301 Allergic rhinitis due to pollen: Secondary | ICD-10-CM | POA: Diagnosis not present

## 2017-09-21 DIAGNOSIS — J3089 Other allergic rhinitis: Secondary | ICD-10-CM | POA: Diagnosis not present

## 2017-09-28 ENCOUNTER — Other Ambulatory Visit: Payer: Self-pay

## 2017-09-28 ENCOUNTER — Encounter: Payer: Self-pay | Admitting: General Practice

## 2017-09-28 ENCOUNTER — Encounter: Payer: Self-pay | Admitting: Family Medicine

## 2017-09-28 ENCOUNTER — Ambulatory Visit: Payer: Commercial Managed Care - PPO | Admitting: Family Medicine

## 2017-09-28 ENCOUNTER — Encounter: Payer: Self-pay | Admitting: Gastroenterology

## 2017-09-28 VITALS — BP 122/82 | HR 74 | Temp 98.3°F | Resp 16 | Ht 60.0 in | Wt 164.1 lb

## 2017-09-28 DIAGNOSIS — E785 Hyperlipidemia, unspecified: Secondary | ICD-10-CM | POA: Diagnosis not present

## 2017-09-28 DIAGNOSIS — R030 Elevated blood-pressure reading, without diagnosis of hypertension: Secondary | ICD-10-CM | POA: Diagnosis not present

## 2017-09-28 DIAGNOSIS — E119 Type 2 diabetes mellitus without complications: Secondary | ICD-10-CM

## 2017-09-28 DIAGNOSIS — J301 Allergic rhinitis due to pollen: Secondary | ICD-10-CM | POA: Diagnosis not present

## 2017-09-28 DIAGNOSIS — R232 Flushing: Secondary | ICD-10-CM

## 2017-09-28 DIAGNOSIS — Z1211 Encounter for screening for malignant neoplasm of colon: Secondary | ICD-10-CM | POA: Diagnosis not present

## 2017-09-28 DIAGNOSIS — J3081 Allergic rhinitis due to animal (cat) (dog) hair and dander: Secondary | ICD-10-CM | POA: Diagnosis not present

## 2017-09-28 DIAGNOSIS — E1169 Type 2 diabetes mellitus with other specified complication: Secondary | ICD-10-CM | POA: Diagnosis not present

## 2017-09-28 DIAGNOSIS — J3089 Other allergic rhinitis: Secondary | ICD-10-CM | POA: Diagnosis not present

## 2017-09-28 LAB — LIPID PANEL
CHOL/HDL RATIO: 4
CHOLESTEROL: 198 mg/dL (ref 0–200)
HDL: 56.4 mg/dL (ref >39.00)
LDL CALC: 124 mg/dL — AB (ref 0–99)
NONHDL: 141.51
Triglycerides: 86 mg/dL (ref 0.0–149.0)
VLDL: 17.2 mg/dL (ref 0.0–40.0)

## 2017-09-28 LAB — BASIC METABOLIC PANEL
BUN: 11 mg/dL (ref 6–23)
CALCIUM: 10 mg/dL (ref 8.4–10.5)
CO2: 31 mEq/L (ref 19–32)
Chloride: 100 mEq/L (ref 96–112)
Creatinine, Ser: 0.6 mg/dL (ref 0.40–1.20)
GFR: 135.89 mL/min (ref >60.00)
GLUCOSE: 116 mg/dL — AB (ref 70–99)
Potassium: 4.1 mEq/L (ref 3.5–5.1)
SODIUM: 139 meq/L (ref 135–145)

## 2017-09-28 LAB — TSH: TSH: 2.44 u[IU]/mL (ref 0.35–4.50)

## 2017-09-28 LAB — CBC WITH DIFFERENTIAL/PLATELET
BASOS ABS: 0 10*3/uL (ref 0.0–0.1)
Basophils Relative: 0.4 % (ref 0.0–3.0)
EOS ABS: 0 10*3/uL (ref 0.0–0.7)
Eosinophils Relative: 1 % (ref 0.0–5.0)
HEMATOCRIT: 40 % (ref 36.0–46.0)
HEMOGLOBIN: 13.2 g/dL (ref 12.0–15.0)
LYMPHS PCT: 54 % — AB (ref 12.0–46.0)
Lymphs Abs: 2.4 10*3/uL (ref 0.7–4.0)
MCHC: 32.9 g/dL (ref 30.0–36.0)
MCV: 95.1 fl (ref 78.0–100.0)
MONO ABS: 0.3 10*3/uL (ref 0.1–1.0)
MONOS PCT: 6.5 % (ref 3.0–12.0)
Neutro Abs: 1.7 10*3/uL (ref 1.4–7.7)
Neutrophils Relative %: 38.1 % — ABNORMAL LOW (ref 43.0–77.0)
Platelets: 338 10*3/uL (ref 150.0–400.0)
RBC: 4.21 Mil/uL (ref 3.87–5.11)
RDW: 13.3 % (ref 11.5–15.5)
WBC: 4.4 10*3/uL (ref 4.0–10.5)

## 2017-09-28 LAB — LUTEINIZING HORMONE: LH: 30.62 m[IU]/mL

## 2017-09-28 LAB — HEPATIC FUNCTION PANEL
ALT: 18 U/L (ref 0–35)
AST: 12 U/L (ref 0–37)
Albumin: 4.6 g/dL (ref 3.5–5.2)
Alkaline Phosphatase: 50 U/L (ref 39–117)
BILIRUBIN DIRECT: 0.1 mg/dL (ref 0.0–0.3)
TOTAL PROTEIN: 7.1 g/dL (ref 6.0–8.3)
Total Bilirubin: 0.8 mg/dL (ref 0.2–1.2)

## 2017-09-28 LAB — HEMOGLOBIN A1C: Hgb A1c MFr Bld: 6.6 % — ABNORMAL HIGH (ref 4.6–6.5)

## 2017-09-28 LAB — FOLLICLE STIMULATING HORMONE: FSH: 86.1 m[IU]/mL

## 2017-09-28 NOTE — Assessment & Plan Note (Signed)
Well controlled today.  On ACE for renal protection in DM.  No need to increase dose at this time.  Check labs.  No anticipated med changes.  Will follow.

## 2017-09-28 NOTE — Assessment & Plan Note (Signed)
Chronic problem.  Tolerating statin w/o difficulty.  Check labs.  Adjust meds prn  

## 2017-09-28 NOTE — Assessment & Plan Note (Signed)
Chronic problem.  Doing well on Metformin.  UTD on eye exam, foot exam, and on ACE for renal protection.  Check labs.  Adjust meds prn

## 2017-09-28 NOTE — Patient Instructions (Addendum)
Schedule your complete physical in 6 months We'll notify you of your lab results and make any changes if needed if needed Keep up the good work on healthy diet and regular exercise- you look great!!! We'll call you with your GI appt Call with any questions or concerns Happy Thanksgiving!!!

## 2017-09-28 NOTE — Progress Notes (Signed)
   Subjective:    Patient ID: Sonia Little, female    DOB: 1966-04-13, 51 y.o.   MRN: [id number]  HPI DM- chronic problem, on Metformin XR 500mg .  On ACE for renal protection.  UTD on eye exam, foot exam.  Pt reports feeling 'pretty good'.  Denies symptomatic lows.  No CP, SOB, HAs, visual changes, numbness/tingling of hands or feet.  Elevated BP- pt w/ hx of this, currently on Ramipril 2.5mg  daily for renal protection w/ good BP control.  No edema or palpitations.  Hyperlipidemia- chronic problem, on Lipitor.  No abd pain, N/V.  + hot flashes- pt is not certain of her menopausal status based on hysterectomy   Review of Systems For ROS see HPI     Objective:   Physical Exam  Constitutional: She is oriented to person, place, and time. She appears well-developed and well-nourished. No distress.  HENT:  Head: Normocephalic and atraumatic.  Eyes: Conjunctivae and EOM are normal. Pupils are equal, round, and reactive to light.  Neck: Normal range of motion. Neck supple. No thyromegaly present.  Cardiovascular: Normal rate, regular rhythm, normal heart sounds and intact distal pulses.  No murmur heard. Pulmonary/Chest: Effort normal and breath sounds normal. No respiratory distress.  Abdominal: Soft. She exhibits no distension. There is no tenderness.  Musculoskeletal: She exhibits no edema.  Lymphadenopathy:    She has no cervical adenopathy.  Neurological: She is alert and oriented to person, place, and time.  Skin: Skin is warm and dry.  Psychiatric: She has a normal mood and affect. Her behavior is normal.  Vitals reviewed.         Assessment & Plan:

## 2017-09-29 ENCOUNTER — Ambulatory Visit: Payer: Commercial Managed Care - PPO | Admitting: Family Medicine

## 2017-10-05 DIAGNOSIS — J3089 Other allergic rhinitis: Secondary | ICD-10-CM | POA: Diagnosis not present

## 2017-10-05 DIAGNOSIS — J3081 Allergic rhinitis due to animal (cat) (dog) hair and dander: Secondary | ICD-10-CM | POA: Diagnosis not present

## 2017-10-05 DIAGNOSIS — J301 Allergic rhinitis due to pollen: Secondary | ICD-10-CM | POA: Diagnosis not present

## 2017-10-12 DIAGNOSIS — J3081 Allergic rhinitis due to animal (cat) (dog) hair and dander: Secondary | ICD-10-CM | POA: Diagnosis not present

## 2017-10-12 DIAGNOSIS — J3089 Other allergic rhinitis: Secondary | ICD-10-CM | POA: Diagnosis not present

## 2017-10-12 DIAGNOSIS — J301 Allergic rhinitis due to pollen: Secondary | ICD-10-CM | POA: Diagnosis not present

## 2017-10-19 ENCOUNTER — Ambulatory Visit
Admission: RE | Admit: 2017-10-19 | Discharge: 2017-10-19 | Disposition: A | Discharge status: Home / Self Care | Payer: Commercial Managed Care - PPO | Source: Ambulatory Visit | Attending: Family Medicine | Admitting: Family Medicine

## 2017-10-19 DIAGNOSIS — Z1231 Encounter for screening mammogram for malignant neoplasm of breast: Secondary | ICD-10-CM | POA: Diagnosis not present

## 2017-10-20 ENCOUNTER — Other Ambulatory Visit: Payer: Self-pay | Admitting: Family Medicine

## 2017-10-21 ENCOUNTER — Other Ambulatory Visit: Payer: Self-pay | Admitting: Family Medicine

## 2017-10-21 DIAGNOSIS — J3089 Other allergic rhinitis: Secondary | ICD-10-CM | POA: Diagnosis not present

## 2017-10-21 DIAGNOSIS — J301 Allergic rhinitis due to pollen: Secondary | ICD-10-CM | POA: Diagnosis not present

## 2017-10-21 MED ORDER — ATORVASTATIN CALCIUM 20 MG PO TABS: 20.0000 mg | ORAL_TABLET | Freq: Every day | ORAL | 0 refills | Status: DC

## 2017-10-30 ENCOUNTER — Other Ambulatory Visit: Payer: Self-pay

## 2017-10-30 ENCOUNTER — Encounter: Payer: Self-pay | Admitting: Gastroenterology

## 2017-10-30 ENCOUNTER — Ambulatory Visit (AMBULATORY_SURGERY_CENTER): Payer: Self-pay | Admitting: *Deleted

## 2017-10-30 VITALS — Ht 60.0 in | Wt 165.0 lb

## 2017-10-30 DIAGNOSIS — J301 Allergic rhinitis due to pollen: Secondary | ICD-10-CM | POA: Diagnosis not present

## 2017-10-30 DIAGNOSIS — J3089 Other allergic rhinitis: Secondary | ICD-10-CM | POA: Diagnosis not present

## 2017-10-30 DIAGNOSIS — Z1211 Encounter for screening for malignant neoplasm of colon: Secondary | ICD-10-CM

## 2017-10-30 DIAGNOSIS — J3081 Allergic rhinitis due to animal (cat) (dog) hair and dander: Secondary | ICD-10-CM | POA: Diagnosis not present

## 2017-10-30 MED ORDER — PLENVU 140 G PO SOLR: 1.0000 | Freq: Once | ORAL | 0 refills | Status: AC

## 2017-10-30 NOTE — Progress Notes (Signed)
Patient denies any allergies to eggs or soy. Patient denies any problems with anesthesia/sedation. Patient denies any oxygen use at home. Patient denies taking any diet/weight loss medications or blood thinners. EMMI education assisgned to patient on colonoscopy, this was explained and instructions given to patient. $50 [employer] coupon given to patient.

## 2017-11-02 DIAGNOSIS — J3089 Other allergic rhinitis: Secondary | ICD-10-CM | POA: Diagnosis not present

## 2017-11-02 DIAGNOSIS — J301 Allergic rhinitis due to pollen: Secondary | ICD-10-CM | POA: Diagnosis not present

## 2017-11-02 DIAGNOSIS — J3081 Allergic rhinitis due to animal (cat) (dog) hair and dander: Secondary | ICD-10-CM | POA: Diagnosis not present

## 2017-11-09 ENCOUNTER — Ambulatory Visit (AMBULATORY_SURGERY_CENTER): Payer: Commercial Managed Care - PPO | Admitting: Gastroenterology

## 2017-11-09 ENCOUNTER — Encounter: Payer: Self-pay | Admitting: Gastroenterology

## 2017-11-09 VITALS — BP 127/69 | HR 71 | Temp 96.8°F | Resp 18 | Wt 165.0 lb

## 2017-11-09 DIAGNOSIS — Z1211 Encounter for screening for malignant neoplasm of colon: Secondary | ICD-10-CM | POA: Diagnosis present

## 2017-11-09 DIAGNOSIS — D128 Benign neoplasm of rectum: Secondary | ICD-10-CM

## 2017-11-09 DIAGNOSIS — K633 Ulcer of intestine: Secondary | ICD-10-CM | POA: Diagnosis not present

## 2017-11-09 DIAGNOSIS — K621 Rectal polyp: Secondary | ICD-10-CM

## 2017-11-09 DIAGNOSIS — K635 Polyp of colon: Secondary | ICD-10-CM

## 2017-11-09 DIAGNOSIS — D129 Benign neoplasm of anus and anal canal: Secondary | ICD-10-CM

## 2017-11-09 DIAGNOSIS — D127 Benign neoplasm of rectosigmoid junction: Secondary | ICD-10-CM | POA: Diagnosis not present

## 2017-11-09 DIAGNOSIS — D125 Benign neoplasm of sigmoid colon: Secondary | ICD-10-CM

## 2017-11-09 MED ORDER — SODIUM CHLORIDE 0.9 % IV SOLN: 500.0000 mL | INTRAVENOUS | Status: DC

## 2017-11-09 NOTE — Patient Instructions (Signed)
**Handout given to patient on polyps**  YOU HAD AN ENDOSCOPIC PROCEDURE TODAY AT [facility]:   Refer to the procedure report that was given to you for any specific questions about what was found during the examination.  If the procedure report does not answer your questions, please call your gastroenterologist to clarify.  If you requested that your care partner not be given the details of your procedure findings, then the procedure report has been included in a sealed envelope for you to review at your convenience later.  YOU SHOULD EXPECT: Some feelings of bloating in the abdomen. Passage of more gas than usual.  Walking can help get rid of the air that was put into your GI tract during the procedure and reduce the bloating. If you had a lower endoscopy (such as a colonoscopy or flexible sigmoidoscopy) you may notice spotting of blood in your stool or on the toilet paper. If you underwent a bowel prep for your procedure, you may not have a normal bowel movement for a few days.  Please Note:  You might notice some irritation and congestion in your nose or some drainage.  This is from the oxygen used during your procedure.  There is no need for concern and it should clear up in a day or so.  SYMPTOMS TO REPORT IMMEDIATELY:   Following lower endoscopy (colonoscopy or flexible sigmoidoscopy):  Excessive amounts of blood in the stool  Significant tenderness or worsening of abdominal pains  Swelling of the abdomen that is new, acute  Fever of 100F or higher   Following upper endoscopy (EGD)  Vomiting of blood or coffee ground material  New chest pain or pain under the shoulder blades  Painful or persistently difficult swallowing  New shortness of breath  Fever of 100F or higher  Black, tarry-looking stools  For urgent or emergent issues, a gastroenterologist can be reached at any hour by calling [phone].   DIET:  We do recommend a small meal at first, but then you  may proceed to your regular diet.  Drink plenty of fluids but you should avoid alcoholic beverages for 24 hours.  ACTIVITY:  You should plan to take it easy for the rest of today and you should NOT DRIVE or use heavy machinery until tomorrow (because of the sedation medicines used during the test).    FOLLOW UP: Our staff will call the number listed on your records the next business day following your procedure to check on you and address any questions or concerns that you may have regarding the information given to you following your procedure. If we do not reach you, we will leave a message.  However, if you are feeling well and you are not experiencing any problems, there is no need to return our call.  We will assume that you have returned to your regular daily activities without incident.  If any biopsies were taken you will be contacted by phone or by letter within the next 1-3 weeks.  Please call us at [phone] if you have not heard about the biopsies in 3 weeks.    SIGNATURES/CONFIDENTIALITY: You and/or your care partner have signed paperwork which will be entered into your electronic medical record.  These signatures attest to the fact that that the information above on your After Visit Summary has been reviewed and is understood.  Full responsibility of the confidentiality of this discharge information lies with you and/or your care-partner.YOU HAD AN ENDOSCOPIC PROCEDURE TODAY  AT [facility]:   Refer to the procedure report that was given to you for any specific questions about what was found during the examination.  If the procedure report does not answer your questions, please call your gastroenterologist to clarify.  If you requested that your care partner not be given the details of your procedure findings, then the procedure report has been included in a sealed envelope for you to review at your convenience later.  YOU SHOULD EXPECT: Some feelings of bloating in  the abdomen. Passage of more gas than usual.  Walking can help get rid of the air that was put into your GI tract during the procedure and reduce the bloating. If you had a lower endoscopy (such as a colonoscopy or flexible sigmoidoscopy) you may notice spotting of blood in your stool or on the toilet paper. If you underwent a bowel prep for your procedure, you may not have a normal bowel movement for a few days.  Please Note:  You might notice some irritation and congestion in your nose or some drainage.  This is from the oxygen used during your procedure.  There is no need for concern and it should clear up in a day or so.  SYMPTOMS TO REPORT IMMEDIATELY:   Following lower endoscopy (colonoscopy or flexible sigmoidoscopy):  Excessive amounts of blood in the stool  Significant tenderness or worsening of abdominal pains  Swelling of the abdomen that is new, acute  Fever of 100F or higher   Following upper endoscopy (EGD)  Vomiting of blood or coffee ground material  New chest pain or pain under the shoulder blades  Painful or persistently difficult swallowing  New shortness of breath  Fever of 100F or higher  Black, tarry-looking stools  For urgent or emergent issues, a gastroenterologist can be reached at any hour by calling [phone].   DIET:  We do recommend a small meal at first, but then you may proceed to your regular diet.  Drink plenty of fluids but you should avoid alcoholic beverages for 24 hours.  ACTIVITY:  You should plan to take it easy for the rest of today and you should NOT DRIVE or use heavy machinery until tomorrow (because of the sedation medicines used during the test).    FOLLOW UP: Our staff will call the number listed on your records the next business day following your procedure to check on you and address any questions or concerns that you may have regarding the information given to you following your procedure. If we do not reach you, we will leave a  message.  However, if you are feeling well and you are not experiencing any problems, there is no need to return our call.  We will assume that you have returned to your regular daily activities without incident.  If any biopsies were taken you will be contacted by phone or by letter within the next 1-3 weeks.  Please call us at [phone] if you have not heard about the biopsies in 3 weeks.    SIGNATURES/CONFIDENTIALITY: You and/or your care partner have signed paperwork which will be entered into your electronic medical record.  These signatures attest to the fact that that the information above on your After Visit Summary has been reviewed and is understood.  Full responsibility of the confidentiality of this discharge information lies with you and/or your care-partner.

## 2017-11-09 NOTE — Progress Notes (Signed)
Called to room to assist during endoscopic procedure.  Patient ID and intended procedure confirmed with present staff. Received instructions for my participation in the procedure from the performing physician.  

## 2017-11-09 NOTE — Progress Notes (Signed)
A/ox3 pleased with MAC, report to [employer]

## 2017-11-09 NOTE — Op Note (Signed)
[facility] Patient Name: Sonia Little Procedure Date: 11/09/2017 10:30 AM MRN: [id number] Endoscopist: Mauri Pole , MD Age: 51 Referring MD:  Date of Birth: 1966-02-23 Gender: Female Account #: [id number] Procedure:                Colonoscopy Indications:              Screening for colorectal malignant neoplasm Medicines:                Monitored Anesthesia Care Procedure:                Pre-Anesthesia Assessment:                           - Prior to the procedure, a History and Physical                            was performed, and patient medications and                            allergies were reviewed. The patient's tolerance of                            previous anesthesia was also reviewed. The risks                            and benefits of the procedure and the sedation                            options and risks were discussed with the patient.                            All questions were answered, and informed consent                            was obtained. Prior Anticoagulants: The patient has                            taken no previous anticoagulant or antiplatelet                            agents. ASA Grade Assessment: II - A patient with                            mild systemic disease. After reviewing the risks                            and benefits, the patient was deemed in                            satisfactory condition to undergo the procedure.                           After obtaining informed consent, the colonoscope  was passed under direct vision. Throughout the                            procedure, the patient's blood pressure, pulse, and                            oxygen saturations were monitored continuously. The                            Colonoscope was introduced through the anus and                            advanced to the the cecum, identified by                            appendiceal orifice  and ileocecal valve. The                            colonoscopy was performed without difficulty. The                            patient tolerated the procedure well. The quality                            of the bowel preparation was excellent. The                            ileocecal valve, appendiceal orifice, and rectum                            were photographed. Scope In: 10:37:51 AM Scope Out: 10:54:04 AM Scope Withdrawal Time: 0 hours 11 minutes 32 seconds  Total Procedure Duration: 0 hours 16 minutes 13 seconds  Findings:                 The perianal and digital rectal examinations were                            normal.                           A few one mm ulcers were found in the cecum. No                            bleeding was present. No stigmata of recent                            bleeding were seen. Biopsies were taken with a cold                            forceps for histology.                           The terminal ileum appeared normal.  Two sessile polyps were found in the rectum and                            sigmoid colon. The polyps were 1 to 2 mm in size.                            These polyps were removed with a cold biopsy                            forceps. Resection and retrieval were complete.                           Non-bleeding internal hemorrhoids were found during                            retroflexion. The hemorrhoids were small. Complications:            No immediate complications. Estimated Blood Loss:     Estimated blood loss was minimal. Impression:               - A few ulcers in the cecum. Biopsied.                           - The examined portion of the ileum was normal.                           - Two 1 to 2 mm polyps in the rectum and in the                            sigmoid colon, removed with a cold biopsy forceps.                            Resected and retrieved.                           - Non-bleeding  internal hemorrhoids. Recommendation:           - Patient has a contact number available for                            emergencies. The signs and symptoms of potential                            delayed complications were discussed with the                            patient. Return to normal activities tomorrow.                            Written discharge instructions were provided to the                            patient.                           -  Resume previous diet.                           - Continue present medications.                           - Await pathology results.                           - Repeat colonoscopy date to be determined after                            pending pathology results are reviewed for                            surveillance based on pathology results. Mauri Pole, MD 11/09/2017 10:59:47 AM This report has been signed electronically.

## 2017-11-12 ENCOUNTER — Telehealth: Payer: Self-pay | Admitting: *Deleted

## 2017-11-12 DIAGNOSIS — J301 Allergic rhinitis due to pollen: Secondary | ICD-10-CM | POA: Diagnosis not present

## 2017-11-12 DIAGNOSIS — J3081 Allergic rhinitis due to animal (cat) (dog) hair and dander: Secondary | ICD-10-CM | POA: Diagnosis not present

## 2017-11-12 DIAGNOSIS — J3089 Other allergic rhinitis: Secondary | ICD-10-CM | POA: Diagnosis not present

## 2017-11-12 NOTE — Telephone Encounter (Signed)
  Follow up Call-  Call back number 11/09/2017  Post procedure Call Back phone  # [phone]  Permission to leave phone message Yes  Some recent data might be hidden     Patient questions:  Do you have a fever, pain , or abdominal swelling? No. Pain Score  0 *  Have you tolerated food without any problems? Yes.    Have you been able to return to your normal activities? Yes.    Do you have any questions about your discharge instructions: Diet   No. Medications  No. Follow up visit  No.  Do you have questions or concerns about your Care? No.  Actions: * If pain score is 4 or above: No action needed, pain <4.

## 2017-11-18 DIAGNOSIS — M5412 Radiculopathy, cervical region: Secondary | ICD-10-CM | POA: Diagnosis not present

## 2017-11-18 DIAGNOSIS — M9903 Segmental and somatic dysfunction of lumbar region: Secondary | ICD-10-CM | POA: Diagnosis not present

## 2017-11-18 DIAGNOSIS — M9901 Segmental and somatic dysfunction of cervical region: Secondary | ICD-10-CM | POA: Diagnosis not present

## 2017-11-19 DIAGNOSIS — J3089 Other allergic rhinitis: Secondary | ICD-10-CM | POA: Diagnosis not present

## 2017-11-19 DIAGNOSIS — J3081 Allergic rhinitis due to animal (cat) (dog) hair and dander: Secondary | ICD-10-CM | POA: Diagnosis not present

## 2017-11-19 DIAGNOSIS — J301 Allergic rhinitis due to pollen: Secondary | ICD-10-CM | POA: Diagnosis not present

## 2017-11-23 ENCOUNTER — Encounter: Payer: Self-pay | Admitting: Gastroenterology

## 2017-11-25 DIAGNOSIS — J3089 Other allergic rhinitis: Secondary | ICD-10-CM | POA: Diagnosis not present

## 2017-11-25 DIAGNOSIS — J3081 Allergic rhinitis due to animal (cat) (dog) hair and dander: Secondary | ICD-10-CM | POA: Diagnosis not present

## 2017-11-25 DIAGNOSIS — J301 Allergic rhinitis due to pollen: Secondary | ICD-10-CM | POA: Diagnosis not present

## 2017-12-03 DIAGNOSIS — J3081 Allergic rhinitis due to animal (cat) (dog) hair and dander: Secondary | ICD-10-CM | POA: Diagnosis not present

## 2017-12-03 DIAGNOSIS — J3089 Other allergic rhinitis: Secondary | ICD-10-CM | POA: Diagnosis not present

## 2017-12-03 DIAGNOSIS — J301 Allergic rhinitis due to pollen: Secondary | ICD-10-CM | POA: Diagnosis not present

## 2017-12-08 DIAGNOSIS — J301 Allergic rhinitis due to pollen: Secondary | ICD-10-CM | POA: Diagnosis not present

## 2017-12-08 DIAGNOSIS — J3081 Allergic rhinitis due to animal (cat) (dog) hair and dander: Secondary | ICD-10-CM | POA: Diagnosis not present

## 2017-12-08 DIAGNOSIS — J3089 Other allergic rhinitis: Secondary | ICD-10-CM | POA: Diagnosis not present

## 2017-12-15 DIAGNOSIS — J3081 Allergic rhinitis due to animal (cat) (dog) hair and dander: Secondary | ICD-10-CM | POA: Diagnosis not present

## 2017-12-15 DIAGNOSIS — J3089 Other allergic rhinitis: Secondary | ICD-10-CM | POA: Diagnosis not present

## 2017-12-15 DIAGNOSIS — J301 Allergic rhinitis due to pollen: Secondary | ICD-10-CM | POA: Diagnosis not present

## 2017-12-21 DIAGNOSIS — M5412 Radiculopathy, cervical region: Secondary | ICD-10-CM | POA: Diagnosis not present

## 2017-12-21 DIAGNOSIS — M9903 Segmental and somatic dysfunction of lumbar region: Secondary | ICD-10-CM | POA: Diagnosis not present

## 2017-12-21 DIAGNOSIS — M9901 Segmental and somatic dysfunction of cervical region: Secondary | ICD-10-CM | POA: Diagnosis not present

## 2017-12-22 DIAGNOSIS — J3089 Other allergic rhinitis: Secondary | ICD-10-CM | POA: Diagnosis not present

## 2017-12-22 DIAGNOSIS — J301 Allergic rhinitis due to pollen: Secondary | ICD-10-CM | POA: Diagnosis not present

## 2017-12-22 DIAGNOSIS — J3081 Allergic rhinitis due to animal (cat) (dog) hair and dander: Secondary | ICD-10-CM | POA: Diagnosis not present

## 2017-12-30 DIAGNOSIS — J3081 Allergic rhinitis due to animal (cat) (dog) hair and dander: Secondary | ICD-10-CM | POA: Diagnosis not present

## 2017-12-30 DIAGNOSIS — J301 Allergic rhinitis due to pollen: Secondary | ICD-10-CM | POA: Diagnosis not present

## 2017-12-30 DIAGNOSIS — J3089 Other allergic rhinitis: Secondary | ICD-10-CM | POA: Diagnosis not present

## 2018-01-04 ENCOUNTER — Encounter: Payer: Self-pay | Admitting: General Practice

## 2018-01-05 DIAGNOSIS — J301 Allergic rhinitis due to pollen: Secondary | ICD-10-CM | POA: Diagnosis not present

## 2018-01-05 DIAGNOSIS — J3089 Other allergic rhinitis: Secondary | ICD-10-CM | POA: Diagnosis not present

## 2018-01-11 ENCOUNTER — Other Ambulatory Visit: Payer: Self-pay | Admitting: General Practice

## 2018-01-11 DIAGNOSIS — J3081 Allergic rhinitis due to animal (cat) (dog) hair and dander: Secondary | ICD-10-CM | POA: Diagnosis not present

## 2018-01-11 DIAGNOSIS — J301 Allergic rhinitis due to pollen: Secondary | ICD-10-CM | POA: Diagnosis not present

## 2018-01-11 DIAGNOSIS — J3089 Other allergic rhinitis: Secondary | ICD-10-CM | POA: Diagnosis not present

## 2018-01-11 MED ORDER — ATORVASTATIN CALCIUM 20 MG PO TABS: 20.0000 mg | ORAL_TABLET | Freq: Every day | ORAL | 1 refills | Status: DC

## 2018-01-18 DIAGNOSIS — J301 Allergic rhinitis due to pollen: Secondary | ICD-10-CM | POA: Diagnosis not present

## 2018-01-18 DIAGNOSIS — J3081 Allergic rhinitis due to animal (cat) (dog) hair and dander: Secondary | ICD-10-CM | POA: Diagnosis not present

## 2018-01-18 DIAGNOSIS — J3089 Other allergic rhinitis: Secondary | ICD-10-CM | POA: Diagnosis not present

## 2018-01-19 DIAGNOSIS — M9903 Segmental and somatic dysfunction of lumbar region: Secondary | ICD-10-CM | POA: Diagnosis not present

## 2018-01-19 DIAGNOSIS — M9901 Segmental and somatic dysfunction of cervical region: Secondary | ICD-10-CM | POA: Diagnosis not present

## 2018-01-19 DIAGNOSIS — M5412 Radiculopathy, cervical region: Secondary | ICD-10-CM | POA: Diagnosis not present

## 2018-01-25 DIAGNOSIS — J3089 Other allergic rhinitis: Secondary | ICD-10-CM | POA: Diagnosis not present

## 2018-01-25 DIAGNOSIS — J3081 Allergic rhinitis due to animal (cat) (dog) hair and dander: Secondary | ICD-10-CM | POA: Diagnosis not present

## 2018-01-25 DIAGNOSIS — J301 Allergic rhinitis due to pollen: Secondary | ICD-10-CM | POA: Diagnosis not present

## 2018-02-01 DIAGNOSIS — J3089 Other allergic rhinitis: Secondary | ICD-10-CM | POA: Diagnosis not present

## 2018-02-01 DIAGNOSIS — J3081 Allergic rhinitis due to animal (cat) (dog) hair and dander: Secondary | ICD-10-CM | POA: Diagnosis not present

## 2018-02-01 DIAGNOSIS — J301 Allergic rhinitis due to pollen: Secondary | ICD-10-CM | POA: Diagnosis not present

## 2018-02-02 ENCOUNTER — Other Ambulatory Visit: Payer: Self-pay

## 2018-02-02 ENCOUNTER — Ambulatory Visit: Payer: Commercial Managed Care - PPO | Admitting: Family Medicine

## 2018-02-02 ENCOUNTER — Encounter: Payer: Self-pay | Admitting: Family Medicine

## 2018-02-02 VITALS — BP 121/81 | HR 82 | Temp 98.5°F | Resp 16 | Ht 60.0 in | Wt 167.5 lb

## 2018-02-02 DIAGNOSIS — K145 Plicated tongue: Secondary | ICD-10-CM

## 2018-02-02 LAB — B12 AND FOLATE PANEL
Folate: >23.6 ng/mL (ref >5.9)
Vitamin B-12: 1158 pg/mL — ABNORMAL HIGH (ref 211–911)

## 2018-02-02 MED ORDER — CHLORHEXIDINE GLUCONATE 0.12 % MT SOLN: OROMUCOSAL | 0 refills | Status: DC

## 2018-02-02 NOTE — Patient Instructions (Signed)
Follow up as needed or as scheduled We'll notify you of your lab results and make any changes if needed START the oral rinse twice daily- swish and spit- until feeling better Get a bite guard and wear this nightly to prevent irritation from your teeth If no improvement, please let me know (and your dentist!) Call with any questions or concerns Hang in there!!!

## 2018-02-02 NOTE — Progress Notes (Signed)
   Subjective:    Patient ID: Sonia Little, female    DOB: [birth year], 52 y.o.   MRN: [id number]  HPI Burning tongue- sxs started 2-3 weeks ago.  Burning is at tip of her tongue and 'there's a crater in the center of it'.  No fevers.  No medication changes or changes to vitamins/supplements.  No new or different foods.  No change in taste.   Review of Systems For ROS see HPI     Objective:   Physical Exam  Constitutional: She is oriented to person, place, and time. She appears well-developed and well-nourished. No distress.  HENT:  Head: Normocephalic and atraumatic.  Mouth/Throat: Oropharynx is clear and moist.  3 small fissures in center of tongue w/o surrounding redness or inflammation  Neck: Normal range of motion. Neck supple.  Lymphadenopathy:    She has no cervical adenopathy.  Neurological: She is alert and oriented to person, place, and time.  Psychiatric: She has a normal mood and affect. Her behavior is normal. Thought content normal.  Vitals reviewed.         Assessment & Plan:  Fissured tongue- new.  Pt reports sxs started 2-3 weeks ago and that was the first time she noticed fissures in her tongue.  No evidence of retained food or infection.  She is under quite a bit of stress so I wonder about trauma from teeth at night while she sleeps.  Pt to get OTC bite guard.  Will also check B12 and folate to r/o deficiency.  Pt has dentist appt upcoming next week.  Reviewed supportive care and red flags that should prompt return.  Pt expressed understanding and is in agreement w/ plan.

## 2018-02-09 DIAGNOSIS — J301 Allergic rhinitis due to pollen: Secondary | ICD-10-CM | POA: Diagnosis not present

## 2018-02-09 DIAGNOSIS — J3089 Other allergic rhinitis: Secondary | ICD-10-CM | POA: Diagnosis not present

## 2018-02-09 DIAGNOSIS — J3081 Allergic rhinitis due to animal (cat) (dog) hair and dander: Secondary | ICD-10-CM | POA: Diagnosis not present

## 2018-02-16 DIAGNOSIS — J3081 Allergic rhinitis due to animal (cat) (dog) hair and dander: Secondary | ICD-10-CM | POA: Diagnosis not present

## 2018-02-16 DIAGNOSIS — J301 Allergic rhinitis due to pollen: Secondary | ICD-10-CM | POA: Diagnosis not present

## 2018-02-16 DIAGNOSIS — J3089 Other allergic rhinitis: Secondary | ICD-10-CM | POA: Diagnosis not present

## 2018-03-01 DIAGNOSIS — J301 Allergic rhinitis due to pollen: Secondary | ICD-10-CM | POA: Diagnosis not present

## 2018-03-01 DIAGNOSIS — J3081 Allergic rhinitis due to animal (cat) (dog) hair and dander: Secondary | ICD-10-CM | POA: Diagnosis not present

## 2018-03-01 DIAGNOSIS — J3089 Other allergic rhinitis: Secondary | ICD-10-CM | POA: Diagnosis not present

## 2018-03-03 DIAGNOSIS — J301 Allergic rhinitis due to pollen: Secondary | ICD-10-CM | POA: Diagnosis not present

## 2018-03-04 DIAGNOSIS — J3081 Allergic rhinitis due to animal (cat) (dog) hair and dander: Secondary | ICD-10-CM | POA: Diagnosis not present

## 2018-03-04 DIAGNOSIS — J3089 Other allergic rhinitis: Secondary | ICD-10-CM | POA: Diagnosis not present

## 2018-03-09 DIAGNOSIS — J301 Allergic rhinitis due to pollen: Secondary | ICD-10-CM | POA: Diagnosis not present

## 2018-03-09 DIAGNOSIS — J3081 Allergic rhinitis due to animal (cat) (dog) hair and dander: Secondary | ICD-10-CM | POA: Diagnosis not present

## 2018-03-09 DIAGNOSIS — J3089 Other allergic rhinitis: Secondary | ICD-10-CM | POA: Diagnosis not present

## 2018-03-15 DIAGNOSIS — M722 Plantar fascial fibromatosis: Secondary | ICD-10-CM | POA: Diagnosis not present

## 2018-03-17 DIAGNOSIS — J301 Allergic rhinitis due to pollen: Secondary | ICD-10-CM | POA: Diagnosis not present

## 2018-03-17 DIAGNOSIS — J3081 Allergic rhinitis due to animal (cat) (dog) hair and dander: Secondary | ICD-10-CM | POA: Diagnosis not present

## 2018-03-17 DIAGNOSIS — J3089 Other allergic rhinitis: Secondary | ICD-10-CM | POA: Diagnosis not present

## 2018-03-22 DIAGNOSIS — M9903 Segmental and somatic dysfunction of lumbar region: Secondary | ICD-10-CM | POA: Diagnosis not present

## 2018-03-22 DIAGNOSIS — J301 Allergic rhinitis due to pollen: Secondary | ICD-10-CM | POA: Diagnosis not present

## 2018-03-22 DIAGNOSIS — M9901 Segmental and somatic dysfunction of cervical region: Secondary | ICD-10-CM | POA: Diagnosis not present

## 2018-03-22 DIAGNOSIS — J3089 Other allergic rhinitis: Secondary | ICD-10-CM | POA: Diagnosis not present

## 2018-03-22 DIAGNOSIS — M5412 Radiculopathy, cervical region: Secondary | ICD-10-CM | POA: Diagnosis not present

## 2018-03-22 DIAGNOSIS — J3081 Allergic rhinitis due to animal (cat) (dog) hair and dander: Secondary | ICD-10-CM | POA: Diagnosis not present

## 2018-03-29 DIAGNOSIS — J3081 Allergic rhinitis due to animal (cat) (dog) hair and dander: Secondary | ICD-10-CM | POA: Diagnosis not present

## 2018-03-29 DIAGNOSIS — J301 Allergic rhinitis due to pollen: Secondary | ICD-10-CM | POA: Diagnosis not present

## 2018-03-29 DIAGNOSIS — J3089 Other allergic rhinitis: Secondary | ICD-10-CM | POA: Diagnosis not present

## 2018-04-09 DIAGNOSIS — J3081 Allergic rhinitis due to animal (cat) (dog) hair and dander: Secondary | ICD-10-CM | POA: Diagnosis not present

## 2018-04-09 DIAGNOSIS — J3089 Other allergic rhinitis: Secondary | ICD-10-CM | POA: Diagnosis not present

## 2018-04-09 DIAGNOSIS — J301 Allergic rhinitis due to pollen: Secondary | ICD-10-CM | POA: Diagnosis not present

## 2018-04-12 ENCOUNTER — Other Ambulatory Visit: Payer: Self-pay | Admitting: Family Medicine

## 2018-04-14 DIAGNOSIS — J301 Allergic rhinitis due to pollen: Secondary | ICD-10-CM | POA: Diagnosis not present

## 2018-04-14 DIAGNOSIS — J3081 Allergic rhinitis due to animal (cat) (dog) hair and dander: Secondary | ICD-10-CM | POA: Diagnosis not present

## 2018-04-14 DIAGNOSIS — J3089 Other allergic rhinitis: Secondary | ICD-10-CM | POA: Diagnosis not present

## 2018-04-19 ENCOUNTER — Encounter: Payer: Self-pay | Admitting: Family Medicine

## 2018-04-19 ENCOUNTER — Ambulatory Visit (INDEPENDENT_AMBULATORY_CARE_PROVIDER_SITE_OTHER): Payer: Commercial Managed Care - PPO | Admitting: Family Medicine

## 2018-04-19 ENCOUNTER — Other Ambulatory Visit: Payer: Self-pay

## 2018-04-19 VITALS — BP 120/80 | HR 87 | Temp 97.9°F | Resp 16 | Ht 60.0 in | Wt 167.0 lb

## 2018-04-19 DIAGNOSIS — E119 Type 2 diabetes mellitus without complications: Secondary | ICD-10-CM

## 2018-04-19 DIAGNOSIS — Z0000 Encounter for general adult medical examination without abnormal findings: Secondary | ICD-10-CM

## 2018-04-19 DIAGNOSIS — E785 Hyperlipidemia, unspecified: Secondary | ICD-10-CM

## 2018-04-19 DIAGNOSIS — E1169 Type 2 diabetes mellitus with other specified complication: Secondary | ICD-10-CM

## 2018-04-19 DIAGNOSIS — M722 Plantar fascial fibromatosis: Secondary | ICD-10-CM | POA: Diagnosis not present

## 2018-04-19 LAB — BASIC METABOLIC PANEL
BUN: 8 mg/dL (ref 6–23)
CO2: 33 meq/L — AB (ref 19–32)
Calcium: 10 mg/dL (ref 8.4–10.5)
Chloride: 101 mEq/L (ref 96–112)
Creatinine, Ser: 0.59 mg/dL (ref 0.40–1.20)
GFR: 138.24 mL/min (ref >60.00)
GLUCOSE: 135 mg/dL — AB (ref 70–99)
Potassium: 3.9 mEq/L (ref 3.5–5.1)
SODIUM: 140 meq/L (ref 135–145)

## 2018-04-19 LAB — CBC WITH DIFFERENTIAL/PLATELET
BASOS PCT: 0.2 % (ref 0.0–3.0)
Basophils Absolute: 0 10*3/uL (ref 0.0–0.1)
Eosinophils Absolute: 0 10*3/uL (ref 0.0–0.7)
Eosinophils Relative: 0.7 % (ref 0.0–5.0)
HCT: 38.5 % (ref 36.0–46.0)
Hemoglobin: 13 g/dL (ref 12.0–15.0)
LYMPHS ABS: 1.9 10*3/uL (ref 0.7–4.0)
Lymphocytes Relative: 48 % — ABNORMAL HIGH (ref 12.0–46.0)
MCHC: 33.7 g/dL (ref 30.0–36.0)
MCV: 94 fl (ref 78.0–100.0)
MONO ABS: 0.2 10*3/uL (ref 0.1–1.0)
Monocytes Relative: 4.8 % (ref 3.0–12.0)
NEUTROS ABS: 1.9 10*3/uL (ref 1.4–7.7)
NEUTROS PCT: 46.3 % (ref 43.0–77.0)
Platelets: 342 10*3/uL (ref 150.0–400.0)
RBC: 4.09 Mil/uL (ref 3.87–5.11)
RDW: 13.5 % (ref 11.5–15.5)
WBC: 4 10*3/uL (ref 4.0–10.5)

## 2018-04-19 LAB — HEPATIC FUNCTION PANEL
ALK PHOS: 54 U/L (ref 39–117)
ALT: 17 U/L (ref 0–35)
AST: 11 U/L (ref 0–37)
Albumin: 4.5 g/dL (ref 3.5–5.2)
BILIRUBIN TOTAL: 0.7 mg/dL (ref 0.2–1.2)
Bilirubin, Direct: 0.1 mg/dL (ref 0.0–0.3)
Total Protein: 6.8 g/dL (ref 6.0–8.3)

## 2018-04-19 LAB — LIPID PANEL
CHOLESTEROL: 198 mg/dL (ref 0–200)
HDL: 51.5 mg/dL (ref >39.00)
LDL CALC: 123 mg/dL — AB (ref 0–99)
NONHDL: 146.04
Total CHOL/HDL Ratio: 4
Triglycerides: 114 mg/dL (ref 0.0–149.0)
VLDL: 22.8 mg/dL (ref 0.0–40.0)

## 2018-04-19 LAB — TSH: TSH: 2.21 u[IU]/mL (ref 0.35–4.50)

## 2018-04-19 NOTE — Progress Notes (Signed)
   Subjective:    Patient ID: Sonia Little, female    DOB: 11-Oct-1966, 52 y.o.   MRN: [id number]  HPI CPE- UTD on mammo, colonoscopy, eye exam.  Due for foot exam.  Exercising regularly.   Review of Systems Patient reports no vision/ hearing changes, adenopathy,fever, weight change,  persistant/recurrent hoarseness , swallowing issues, chest pain, palpitations, edema, persistant/recurrent cough, hemoptysis, dyspnea (rest/exertional/paroxysmal nocturnal), gastrointestinal bleeding (melena, rectal bleeding), abdominal pain, significant heartburn, bowel changes, GU symptoms (dysuria, hematuria, incontinence), Gyn symptoms (abnormal  bleeding, pain),  syncope, focal weakness, memory loss, numbness & tingling, skin/hair/nail changes, abnormal bruising or bleeding, anxiety, or depression.     Objective:   Physical Exam General Appearance:    Alert, cooperative, no distress, appears stated age  Head:    Normocephalic, without obvious abnormality, atraumatic  Eyes:    PERRL, conjunctiva/corneas clear, EOM's intact, fundi    benign, both eyes  Ears:    Normal TM's and external ear canals, both ears  Nose:   Nares normal, septum midline, mucosa normal, no drainage    or sinus tenderness  Throat:   Lips, mucosa, and tongue normal; teeth and gums normal  Neck:   Supple, symmetrical, trachea midline, no adenopathy;    Thyroid: no enlargement/tenderness/nodules  Back:     Symmetric, no curvature, ROM normal, no CVA tenderness  Lungs:     Clear to auscultation bilaterally, respirations unlabored  Chest Wall:    No tenderness or deformity   Heart:    Regular rate and rhythm, S1 and S2 normal, no murmur, rub   or gallop  Breast Exam:    Deferred to mammo  Abdomen:     Soft, non-tender, bowel sounds active all four quadrants,    no masses, no organomegaly  Genitalia:    Deferred to GYN  Rectal:    Extremities:   Extremities normal, atraumatic, no cyanosis or edema  Pulses:   2+ and symmetric all  extremities  Skin:   Skin color, texture, turgor normal, no rashes or lesions  Lymph nodes:   Cervical, supraclavicular, and axillary nodes normal  Neurologic:   CNII-XII intact, normal strength, sensation and reflexes    throughout          Assessment & Plan:

## 2018-04-19 NOTE — Patient Instructions (Signed)
Follow up in 6 months to recheck sugar, BP, and cholesterol We'll notify you of your lab results and make any changes if needed Continue to work on healthy diet and regular exercise- you can do it! Call with any questions or concerns Have a great summer!!

## 2018-04-19 NOTE — Assessment & Plan Note (Signed)
Chronic problem.  Tolerating statin w/o difficulty.  Check labs.  Adjust meds prn  

## 2018-04-19 NOTE — Assessment & Plan Note (Signed)
Pt's PE WNL.  UTD on mammo, colonoscopy.  Check labs.  Anticipatory guidance provided.

## 2018-04-19 NOTE — Assessment & Plan Note (Signed)
Chronic problem.  Hx of good control.  UTD on eye exam.  Foot exam done today.  On ACE for renal protection.  Check labs.  Adjust meds prn

## 2018-04-20 ENCOUNTER — Other Ambulatory Visit (INDEPENDENT_AMBULATORY_CARE_PROVIDER_SITE_OTHER): Payer: Commercial Managed Care - PPO

## 2018-04-20 DIAGNOSIS — J3081 Allergic rhinitis due to animal (cat) (dog) hair and dander: Secondary | ICD-10-CM | POA: Diagnosis not present

## 2018-04-20 DIAGNOSIS — E119 Type 2 diabetes mellitus without complications: Secondary | ICD-10-CM | POA: Diagnosis not present

## 2018-04-20 DIAGNOSIS — J3089 Other allergic rhinitis: Secondary | ICD-10-CM | POA: Diagnosis not present

## 2018-04-20 DIAGNOSIS — R7309 Other abnormal glucose: Secondary | ICD-10-CM | POA: Diagnosis not present

## 2018-04-20 DIAGNOSIS — J301 Allergic rhinitis due to pollen: Secondary | ICD-10-CM | POA: Diagnosis not present

## 2018-04-20 LAB — HEMOGLOBIN A1C: HEMOGLOBIN A1C: 6.8 % — AB (ref 4.6–6.5)

## 2018-04-21 DIAGNOSIS — M5412 Radiculopathy, cervical region: Secondary | ICD-10-CM | POA: Diagnosis not present

## 2018-04-21 DIAGNOSIS — M9903 Segmental and somatic dysfunction of lumbar region: Secondary | ICD-10-CM | POA: Diagnosis not present

## 2018-04-21 DIAGNOSIS — M9901 Segmental and somatic dysfunction of cervical region: Secondary | ICD-10-CM | POA: Diagnosis not present

## 2018-04-26 DIAGNOSIS — J3081 Allergic rhinitis due to animal (cat) (dog) hair and dander: Secondary | ICD-10-CM | POA: Diagnosis not present

## 2018-04-26 DIAGNOSIS — J301 Allergic rhinitis due to pollen: Secondary | ICD-10-CM | POA: Diagnosis not present

## 2018-04-26 DIAGNOSIS — J3089 Other allergic rhinitis: Secondary | ICD-10-CM | POA: Diagnosis not present

## 2018-05-03 DIAGNOSIS — J3089 Other allergic rhinitis: Secondary | ICD-10-CM | POA: Diagnosis not present

## 2018-05-03 DIAGNOSIS — J301 Allergic rhinitis due to pollen: Secondary | ICD-10-CM | POA: Diagnosis not present

## 2018-05-03 DIAGNOSIS — J3081 Allergic rhinitis due to animal (cat) (dog) hair and dander: Secondary | ICD-10-CM | POA: Diagnosis not present

## 2018-05-04 DIAGNOSIS — L219 Seborrheic dermatitis, unspecified: Secondary | ICD-10-CM | POA: Diagnosis not present

## 2018-05-04 DIAGNOSIS — L659 Nonscarring hair loss, unspecified: Secondary | ICD-10-CM | POA: Diagnosis not present

## 2018-05-12 DIAGNOSIS — J301 Allergic rhinitis due to pollen: Secondary | ICD-10-CM | POA: Diagnosis not present

## 2018-05-12 DIAGNOSIS — J3081 Allergic rhinitis due to animal (cat) (dog) hair and dander: Secondary | ICD-10-CM | POA: Diagnosis not present

## 2018-05-12 DIAGNOSIS — J3089 Other allergic rhinitis: Secondary | ICD-10-CM | POA: Diagnosis not present

## 2018-05-18 DIAGNOSIS — J3081 Allergic rhinitis due to animal (cat) (dog) hair and dander: Secondary | ICD-10-CM | POA: Diagnosis not present

## 2018-05-18 DIAGNOSIS — J301 Allergic rhinitis due to pollen: Secondary | ICD-10-CM | POA: Diagnosis not present

## 2018-05-18 DIAGNOSIS — J3089 Other allergic rhinitis: Secondary | ICD-10-CM | POA: Diagnosis not present

## 2018-05-24 DIAGNOSIS — M9901 Segmental and somatic dysfunction of cervical region: Secondary | ICD-10-CM | POA: Diagnosis not present

## 2018-05-24 DIAGNOSIS — M5412 Radiculopathy, cervical region: Secondary | ICD-10-CM | POA: Diagnosis not present

## 2018-05-24 DIAGNOSIS — M9903 Segmental and somatic dysfunction of lumbar region: Secondary | ICD-10-CM | POA: Diagnosis not present

## 2018-05-25 DIAGNOSIS — J301 Allergic rhinitis due to pollen: Secondary | ICD-10-CM | POA: Diagnosis not present

## 2018-05-25 DIAGNOSIS — J3089 Other allergic rhinitis: Secondary | ICD-10-CM | POA: Diagnosis not present

## 2018-05-25 DIAGNOSIS — J3081 Allergic rhinitis due to animal (cat) (dog) hair and dander: Secondary | ICD-10-CM | POA: Diagnosis not present

## 2018-06-02 DIAGNOSIS — J301 Allergic rhinitis due to pollen: Secondary | ICD-10-CM | POA: Diagnosis not present

## 2018-06-02 DIAGNOSIS — J3089 Other allergic rhinitis: Secondary | ICD-10-CM | POA: Diagnosis not present

## 2018-06-09 DIAGNOSIS — J3089 Other allergic rhinitis: Secondary | ICD-10-CM | POA: Diagnosis not present

## 2018-06-09 DIAGNOSIS — J301 Allergic rhinitis due to pollen: Secondary | ICD-10-CM | POA: Diagnosis not present

## 2018-06-09 DIAGNOSIS — J3081 Allergic rhinitis due to animal (cat) (dog) hair and dander: Secondary | ICD-10-CM | POA: Diagnosis not present

## 2018-06-15 DIAGNOSIS — J3089 Other allergic rhinitis: Secondary | ICD-10-CM | POA: Diagnosis not present

## 2018-06-15 DIAGNOSIS — J3081 Allergic rhinitis due to animal (cat) (dog) hair and dander: Secondary | ICD-10-CM | POA: Diagnosis not present

## 2018-06-15 DIAGNOSIS — J301 Allergic rhinitis due to pollen: Secondary | ICD-10-CM | POA: Diagnosis not present

## 2018-06-21 DIAGNOSIS — M9903 Segmental and somatic dysfunction of lumbar region: Secondary | ICD-10-CM | POA: Diagnosis not present

## 2018-06-21 DIAGNOSIS — M5412 Radiculopathy, cervical region: Secondary | ICD-10-CM | POA: Diagnosis not present

## 2018-06-21 DIAGNOSIS — J301 Allergic rhinitis due to pollen: Secondary | ICD-10-CM | POA: Diagnosis not present

## 2018-06-21 DIAGNOSIS — J3081 Allergic rhinitis due to animal (cat) (dog) hair and dander: Secondary | ICD-10-CM | POA: Diagnosis not present

## 2018-06-21 DIAGNOSIS — M9901 Segmental and somatic dysfunction of cervical region: Secondary | ICD-10-CM | POA: Diagnosis not present

## 2018-06-21 DIAGNOSIS — J3089 Other allergic rhinitis: Secondary | ICD-10-CM | POA: Diagnosis not present

## 2018-06-29 DIAGNOSIS — J301 Allergic rhinitis due to pollen: Secondary | ICD-10-CM | POA: Diagnosis not present

## 2018-06-29 DIAGNOSIS — J3089 Other allergic rhinitis: Secondary | ICD-10-CM | POA: Diagnosis not present

## 2018-06-29 DIAGNOSIS — J3081 Allergic rhinitis due to animal (cat) (dog) hair and dander: Secondary | ICD-10-CM | POA: Diagnosis not present

## 2018-07-07 DIAGNOSIS — J301 Allergic rhinitis due to pollen: Secondary | ICD-10-CM | POA: Diagnosis not present

## 2018-07-07 DIAGNOSIS — J3089 Other allergic rhinitis: Secondary | ICD-10-CM | POA: Diagnosis not present

## 2018-07-07 DIAGNOSIS — J3081 Allergic rhinitis due to animal (cat) (dog) hair and dander: Secondary | ICD-10-CM | POA: Diagnosis not present

## 2018-07-10 ENCOUNTER — Other Ambulatory Visit: Payer: Self-pay | Admitting: Family Medicine

## 2018-07-13 DIAGNOSIS — J3081 Allergic rhinitis due to animal (cat) (dog) hair and dander: Secondary | ICD-10-CM | POA: Diagnosis not present

## 2018-07-13 DIAGNOSIS — J301 Allergic rhinitis due to pollen: Secondary | ICD-10-CM | POA: Diagnosis not present

## 2018-07-13 DIAGNOSIS — J3089 Other allergic rhinitis: Secondary | ICD-10-CM | POA: Diagnosis not present

## 2018-07-14 DIAGNOSIS — M9901 Segmental and somatic dysfunction of cervical region: Secondary | ICD-10-CM | POA: Diagnosis not present

## 2018-07-14 DIAGNOSIS — M5412 Radiculopathy, cervical region: Secondary | ICD-10-CM | POA: Diagnosis not present

## 2018-07-14 DIAGNOSIS — M9903 Segmental and somatic dysfunction of lumbar region: Secondary | ICD-10-CM | POA: Diagnosis not present

## 2018-07-21 DIAGNOSIS — J3089 Other allergic rhinitis: Secondary | ICD-10-CM | POA: Diagnosis not present

## 2018-07-21 DIAGNOSIS — J3081 Allergic rhinitis due to animal (cat) (dog) hair and dander: Secondary | ICD-10-CM | POA: Diagnosis not present

## 2018-07-21 DIAGNOSIS — J301 Allergic rhinitis due to pollen: Secondary | ICD-10-CM | POA: Diagnosis not present

## 2018-07-24 DIAGNOSIS — M722 Plantar fascial fibromatosis: Secondary | ICD-10-CM | POA: Diagnosis not present

## 2018-07-27 DIAGNOSIS — J3089 Other allergic rhinitis: Secondary | ICD-10-CM | POA: Diagnosis not present

## 2018-07-27 DIAGNOSIS — J301 Allergic rhinitis due to pollen: Secondary | ICD-10-CM | POA: Diagnosis not present

## 2018-07-27 DIAGNOSIS — J3081 Allergic rhinitis due to animal (cat) (dog) hair and dander: Secondary | ICD-10-CM | POA: Diagnosis not present

## 2018-08-04 DIAGNOSIS — J3089 Other allergic rhinitis: Secondary | ICD-10-CM | POA: Diagnosis not present

## 2018-08-04 DIAGNOSIS — J301 Allergic rhinitis due to pollen: Secondary | ICD-10-CM | POA: Diagnosis not present

## 2018-08-04 DIAGNOSIS — J3081 Allergic rhinitis due to animal (cat) (dog) hair and dander: Secondary | ICD-10-CM | POA: Diagnosis not present

## 2018-08-04 DIAGNOSIS — H1045 Other chronic allergic conjunctivitis: Secondary | ICD-10-CM | POA: Diagnosis not present

## 2018-08-04 DIAGNOSIS — J4520 Mild intermittent asthma, uncomplicated: Secondary | ICD-10-CM | POA: Diagnosis not present

## 2018-08-12 DIAGNOSIS — J3089 Other allergic rhinitis: Secondary | ICD-10-CM | POA: Diagnosis not present

## 2018-08-12 DIAGNOSIS — J3081 Allergic rhinitis due to animal (cat) (dog) hair and dander: Secondary | ICD-10-CM | POA: Diagnosis not present

## 2018-08-12 DIAGNOSIS — J301 Allergic rhinitis due to pollen: Secondary | ICD-10-CM | POA: Diagnosis not present

## 2018-08-13 DIAGNOSIS — J301 Allergic rhinitis due to pollen: Secondary | ICD-10-CM | POA: Diagnosis not present

## 2018-08-16 DIAGNOSIS — J3081 Allergic rhinitis due to animal (cat) (dog) hair and dander: Secondary | ICD-10-CM | POA: Diagnosis not present

## 2018-08-16 DIAGNOSIS — J3089 Other allergic rhinitis: Secondary | ICD-10-CM | POA: Diagnosis not present

## 2018-08-17 DIAGNOSIS — J301 Allergic rhinitis due to pollen: Secondary | ICD-10-CM | POA: Diagnosis not present

## 2018-08-17 DIAGNOSIS — J3089 Other allergic rhinitis: Secondary | ICD-10-CM | POA: Diagnosis not present

## 2018-08-17 DIAGNOSIS — J3081 Allergic rhinitis due to animal (cat) (dog) hair and dander: Secondary | ICD-10-CM | POA: Diagnosis not present

## 2018-08-24 DIAGNOSIS — J3089 Other allergic rhinitis: Secondary | ICD-10-CM | POA: Diagnosis not present

## 2018-08-24 DIAGNOSIS — J301 Allergic rhinitis due to pollen: Secondary | ICD-10-CM | POA: Diagnosis not present

## 2018-08-24 DIAGNOSIS — J3081 Allergic rhinitis due to animal (cat) (dog) hair and dander: Secondary | ICD-10-CM | POA: Diagnosis not present

## 2018-08-31 ENCOUNTER — Telehealth: Payer: Self-pay | Admitting: Family Medicine

## 2018-08-31 ENCOUNTER — Other Ambulatory Visit: Payer: Self-pay | Admitting: Family Medicine

## 2018-08-31 DIAGNOSIS — Z1231 Encounter for screening mammogram for malignant neoplasm of breast: Secondary | ICD-10-CM

## 2018-08-31 DIAGNOSIS — N644 Mastodynia: Secondary | ICD-10-CM

## 2018-08-31 NOTE — Telephone Encounter (Signed)
Order has been placed, pt is aware and states that she is calling them to schedule.

## 2018-08-31 NOTE — Telephone Encounter (Signed)
Copied from [place] [id number]. Topic: General - Other >> Aug 31, 2018  8:51 AM Keene Breath wrote: Reason for CRM: Patient called to request that the doctor send an order for her to get a mammogram because patient stated that she is experiencing some tenderness in one of her breasts.  [facility] said she would need an order from her PCP before she could be seen.  Please advise.  CB# [phone].

## 2018-08-31 NOTE — Telephone Encounter (Signed)
Experiment for bilateral diagnostic mammo, dx breast pain

## 2018-09-01 DIAGNOSIS — J301 Allergic rhinitis due to pollen: Secondary | ICD-10-CM | POA: Diagnosis not present

## 2018-09-01 DIAGNOSIS — J3089 Other allergic rhinitis: Secondary | ICD-10-CM | POA: Diagnosis not present

## 2018-09-01 DIAGNOSIS — J3081 Allergic rhinitis due to animal (cat) (dog) hair and dander: Secondary | ICD-10-CM | POA: Diagnosis not present

## 2018-09-02 DIAGNOSIS — H0520 Unspecified exophthalmos: Secondary | ICD-10-CM | POA: Diagnosis not present

## 2018-09-02 DIAGNOSIS — E119 Type 2 diabetes mellitus without complications: Secondary | ICD-10-CM | POA: Diagnosis not present

## 2018-09-02 DIAGNOSIS — H2513 Age-related nuclear cataract, bilateral: Secondary | ICD-10-CM | POA: Diagnosis not present

## 2018-09-02 LAB — HM DIABETES EYE EXAM

## 2018-09-03 ENCOUNTER — Ambulatory Visit
Admission: RE | Admit: 2018-09-03 | Discharge: 2018-09-03 | Disposition: A | Discharge status: Home / Self Care | Payer: Commercial Managed Care - PPO | Source: Ambulatory Visit | Attending: Family Medicine | Admitting: Family Medicine

## 2018-09-03 ENCOUNTER — Ambulatory Visit: Payer: Commercial Managed Care - PPO

## 2018-09-03 ENCOUNTER — Ambulatory Visit: Admission: RE | Admit: 2018-09-03 | Payer: Commercial Managed Care - PPO | Source: Ambulatory Visit

## 2018-09-03 DIAGNOSIS — N644 Mastodynia: Secondary | ICD-10-CM

## 2018-09-03 DIAGNOSIS — R928 Other abnormal and inconclusive findings on diagnostic imaging of breast: Secondary | ICD-10-CM | POA: Diagnosis not present

## 2018-09-06 DIAGNOSIS — J301 Allergic rhinitis due to pollen: Secondary | ICD-10-CM | POA: Diagnosis not present

## 2018-09-06 DIAGNOSIS — Z87442 Personal history of urinary calculi: Secondary | ICD-10-CM | POA: Diagnosis not present

## 2018-09-06 DIAGNOSIS — J3089 Other allergic rhinitis: Secondary | ICD-10-CM | POA: Diagnosis not present

## 2018-09-06 DIAGNOSIS — J3081 Allergic rhinitis due to animal (cat) (dog) hair and dander: Secondary | ICD-10-CM | POA: Diagnosis not present

## 2018-09-07 DIAGNOSIS — Z23 Encounter for immunization: Secondary | ICD-10-CM | POA: Diagnosis not present

## 2018-09-08 ENCOUNTER — Encounter: Payer: Self-pay | Admitting: General Practice

## 2018-09-15 DIAGNOSIS — J3089 Other allergic rhinitis: Secondary | ICD-10-CM | POA: Diagnosis not present

## 2018-09-15 DIAGNOSIS — J301 Allergic rhinitis due to pollen: Secondary | ICD-10-CM | POA: Diagnosis not present

## 2018-09-15 DIAGNOSIS — J3081 Allergic rhinitis due to animal (cat) (dog) hair and dander: Secondary | ICD-10-CM | POA: Diagnosis not present

## 2018-09-21 DIAGNOSIS — J3089 Other allergic rhinitis: Secondary | ICD-10-CM | POA: Diagnosis not present

## 2018-09-21 DIAGNOSIS — J3081 Allergic rhinitis due to animal (cat) (dog) hair and dander: Secondary | ICD-10-CM | POA: Diagnosis not present

## 2018-09-21 DIAGNOSIS — J301 Allergic rhinitis due to pollen: Secondary | ICD-10-CM | POA: Diagnosis not present

## 2018-09-28 DIAGNOSIS — J3081 Allergic rhinitis due to animal (cat) (dog) hair and dander: Secondary | ICD-10-CM | POA: Diagnosis not present

## 2018-09-28 DIAGNOSIS — J3089 Other allergic rhinitis: Secondary | ICD-10-CM | POA: Diagnosis not present

## 2018-09-28 DIAGNOSIS — J301 Allergic rhinitis due to pollen: Secondary | ICD-10-CM | POA: Diagnosis not present

## 2018-10-04 DIAGNOSIS — J4520 Mild intermittent asthma, uncomplicated: Secondary | ICD-10-CM | POA: Diagnosis not present

## 2018-10-04 DIAGNOSIS — J301 Allergic rhinitis due to pollen: Secondary | ICD-10-CM | POA: Diagnosis not present

## 2018-10-04 DIAGNOSIS — H1045 Other chronic allergic conjunctivitis: Secondary | ICD-10-CM | POA: Diagnosis not present

## 2018-10-04 DIAGNOSIS — J3089 Other allergic rhinitis: Secondary | ICD-10-CM | POA: Diagnosis not present

## 2018-10-07 ENCOUNTER — Other Ambulatory Visit: Payer: Self-pay | Admitting: Family Medicine

## 2018-10-13 DIAGNOSIS — J3081 Allergic rhinitis due to animal (cat) (dog) hair and dander: Secondary | ICD-10-CM | POA: Diagnosis not present

## 2018-10-13 DIAGNOSIS — J3089 Other allergic rhinitis: Secondary | ICD-10-CM | POA: Diagnosis not present

## 2018-10-13 DIAGNOSIS — J301 Allergic rhinitis due to pollen: Secondary | ICD-10-CM | POA: Diagnosis not present

## 2018-10-18 ENCOUNTER — Other Ambulatory Visit: Payer: Self-pay

## 2018-10-18 ENCOUNTER — Encounter: Payer: Self-pay | Admitting: Family Medicine

## 2018-10-18 ENCOUNTER — Ambulatory Visit: Payer: Commercial Managed Care - PPO | Admitting: Family Medicine

## 2018-10-18 VITALS — BP 126/80 | HR 94 | Temp 98.1°F | Resp 16 | Ht 60.0 in | Wt 164.1 lb

## 2018-10-18 DIAGNOSIS — E119 Type 2 diabetes mellitus without complications: Secondary | ICD-10-CM

## 2018-10-18 DIAGNOSIS — J3089 Other allergic rhinitis: Secondary | ICD-10-CM | POA: Diagnosis not present

## 2018-10-18 DIAGNOSIS — E1169 Type 2 diabetes mellitus with other specified complication: Secondary | ICD-10-CM

## 2018-10-18 DIAGNOSIS — I10 Essential (primary) hypertension: Secondary | ICD-10-CM | POA: Insufficient documentation

## 2018-10-18 DIAGNOSIS — E785 Hyperlipidemia, unspecified: Secondary | ICD-10-CM

## 2018-10-18 DIAGNOSIS — J3081 Allergic rhinitis due to animal (cat) (dog) hair and dander: Secondary | ICD-10-CM | POA: Diagnosis not present

## 2018-10-18 DIAGNOSIS — J301 Allergic rhinitis due to pollen: Secondary | ICD-10-CM | POA: Diagnosis not present

## 2018-10-18 LAB — HEPATIC FUNCTION PANEL
ALT: 20 U/L (ref 0–35)
AST: 14 U/L (ref 0–37)
Albumin: 4.6 g/dL (ref 3.5–5.2)
Alkaline Phosphatase: 54 U/L (ref 39–117)
Bilirubin, Direct: 0.1 mg/dL (ref 0.0–0.3)
Total Bilirubin: 0.6 mg/dL (ref 0.2–1.2)
Total Protein: 7.4 g/dL (ref 6.0–8.3)

## 2018-10-18 LAB — CBC WITH DIFFERENTIAL/PLATELET
BASOS ABS: 0 10*3/uL (ref 0.0–0.1)
Basophils Relative: 0.6 % (ref 0.0–3.0)
Eosinophils Absolute: 0.1 10*3/uL (ref 0.0–0.7)
Eosinophils Relative: 1.1 % (ref 0.0–5.0)
HCT: 39.6 % (ref 36.0–46.0)
Hemoglobin: 13.2 g/dL (ref 12.0–15.0)
Lymphocytes Relative: 45.2 % (ref 12.0–46.0)
Lymphs Abs: 2.2 10*3/uL (ref 0.7–4.0)
MCHC: 33.2 g/dL (ref 30.0–36.0)
MCV: 93.5 fl (ref 78.0–100.0)
Monocytes Absolute: 0.3 10*3/uL (ref 0.1–1.0)
Monocytes Relative: 6.1 % (ref 3.0–12.0)
NEUTROS ABS: 2.3 10*3/uL (ref 1.4–7.7)
Neutrophils Relative %: 47 % (ref 43.0–77.0)
PLATELETS: 357 10*3/uL (ref 150.0–400.0)
RBC: 4.24 Mil/uL (ref 3.87–5.11)
RDW: 13.5 % (ref 11.5–15.5)
WBC: 4.9 10*3/uL (ref 4.0–10.5)

## 2018-10-18 LAB — BASIC METABOLIC PANEL
BUN: 12 mg/dL (ref 6–23)
CALCIUM: 9.8 mg/dL (ref 8.4–10.5)
CHLORIDE: 101 meq/L (ref 96–112)
CO2: 28 meq/L (ref 19–32)
CREATININE: 0.63 mg/dL (ref 0.40–1.20)
GFR: 127.91 mL/min (ref >60.00)
Glucose, Bld: 111 mg/dL — ABNORMAL HIGH (ref 70–99)
Potassium: 3.9 mEq/L (ref 3.5–5.1)
Sodium: 139 mEq/L (ref 135–145)

## 2018-10-18 LAB — LIPID PANEL
Cholesterol: 208 mg/dL — ABNORMAL HIGH (ref 0–200)
HDL: 58.6 mg/dL (ref >39.00)
LDL Cholesterol: 136 mg/dL — ABNORMAL HIGH (ref 0–99)
NonHDL: 149.76
TRIGLYCERIDES: 70 mg/dL (ref 0.0–149.0)
Total CHOL/HDL Ratio: 4
VLDL: 14 mg/dL (ref 0.0–40.0)

## 2018-10-18 LAB — HEMOGLOBIN A1C: Hgb A1c MFr Bld: 7 % — ABNORMAL HIGH (ref 4.6–6.5)

## 2018-10-18 LAB — TSH: TSH: 2.17 u[IU]/mL (ref 0.35–4.50)

## 2018-10-18 NOTE — Patient Instructions (Signed)
Schedule your complete physical in 6 months We'll notify you of your lab results and make any changes if needed Continue to work on healthy diet and regular exercise- you're doing great!! Call with any questions or concerns Happy Holidays!!!

## 2018-10-18 NOTE — Assessment & Plan Note (Signed)
Chronic problem.  Tolerating statin w/o difficulty.  Check labs.  Adjust meds prn  

## 2018-10-18 NOTE — Assessment & Plan Note (Signed)
Chronic problem.  UTD on foot exam, eye exam, and on ACE for renal protection.  Applauded her efforts at healthy diet and regular exercise.  Check labs.  Adjust meds prn

## 2018-10-18 NOTE — Progress Notes (Signed)
   Subjective:    Patient ID: Sonia Little, female    DOB: [birth year], 52 y.o.   MRN: [id number]  HPI HTN- chronic problem, on Ramipril 2.5mg .  No CP, SOB, HAs, visual changes, edema.  Hyperlipidemia- chronic problem, on Lipitor 20mg  and fish oil.  No abd pain, N/V  DM- chronic problem, on Metformin 500mg  BID.  On ACE for renal protection, UTD on eye exam, foot exam.  Denies symptomatic lows.  No numbness/tingling hands/feet.  Continues to exercise- stationary bike or treadmill.     Review of Systems For ROS see HPI     Objective:   Physical Exam  Constitutional: She is oriented to person, place, and time. She appears well-developed and well-nourished. No distress.  HENT:  Head: Normocephalic and atraumatic.  Eyes: Pupils are equal, round, and reactive to light. Conjunctivae and EOM are normal.  Neck: Normal range of motion. Neck supple. No thyromegaly present.  Cardiovascular: Normal rate, regular rhythm, normal heart sounds and intact distal pulses.  No murmur heard. Pulmonary/Chest: Effort normal and breath sounds normal. No respiratory distress.  Abdominal: Soft. She exhibits no distension. There is no tenderness.  Musculoskeletal: She exhibits no edema.  Lymphadenopathy:    She has no cervical adenopathy.  Neurological: She is alert and oriented to person, place, and time.  Skin: Skin is warm and dry.  Psychiatric: She has a normal mood and affect. Her behavior is normal.  Vitals reviewed.         Assessment & Plan:

## 2018-10-18 NOTE — Assessment & Plan Note (Signed)
Chronic problem.  Adequate control.  Asymptomatic.  Check labs.  No anticipated med changes.  Will follow. 

## 2018-10-27 DIAGNOSIS — J301 Allergic rhinitis due to pollen: Secondary | ICD-10-CM | POA: Diagnosis not present

## 2018-10-27 DIAGNOSIS — J3089 Other allergic rhinitis: Secondary | ICD-10-CM | POA: Diagnosis not present

## 2018-10-27 DIAGNOSIS — J3081 Allergic rhinitis due to animal (cat) (dog) hair and dander: Secondary | ICD-10-CM | POA: Diagnosis not present

## 2018-11-01 DIAGNOSIS — J301 Allergic rhinitis due to pollen: Secondary | ICD-10-CM | POA: Diagnosis not present

## 2018-11-01 DIAGNOSIS — J3081 Allergic rhinitis due to animal (cat) (dog) hair and dander: Secondary | ICD-10-CM | POA: Diagnosis not present

## 2018-11-01 DIAGNOSIS — J3089 Other allergic rhinitis: Secondary | ICD-10-CM | POA: Diagnosis not present

## 2018-11-08 DIAGNOSIS — J3081 Allergic rhinitis due to animal (cat) (dog) hair and dander: Secondary | ICD-10-CM | POA: Diagnosis not present

## 2018-11-08 DIAGNOSIS — J3089 Other allergic rhinitis: Secondary | ICD-10-CM | POA: Diagnosis not present

## 2018-11-08 DIAGNOSIS — J301 Allergic rhinitis due to pollen: Secondary | ICD-10-CM | POA: Diagnosis not present

## 2018-11-22 DIAGNOSIS — J3089 Other allergic rhinitis: Secondary | ICD-10-CM | POA: Diagnosis not present

## 2018-11-22 DIAGNOSIS — J3081 Allergic rhinitis due to animal (cat) (dog) hair and dander: Secondary | ICD-10-CM | POA: Diagnosis not present

## 2018-11-22 DIAGNOSIS — J301 Allergic rhinitis due to pollen: Secondary | ICD-10-CM | POA: Diagnosis not present

## 2018-11-29 DIAGNOSIS — J3081 Allergic rhinitis due to animal (cat) (dog) hair and dander: Secondary | ICD-10-CM | POA: Diagnosis not present

## 2018-11-29 DIAGNOSIS — J3089 Other allergic rhinitis: Secondary | ICD-10-CM | POA: Diagnosis not present

## 2018-11-29 DIAGNOSIS — J301 Allergic rhinitis due to pollen: Secondary | ICD-10-CM | POA: Diagnosis not present

## 2018-12-07 DIAGNOSIS — J3081 Allergic rhinitis due to animal (cat) (dog) hair and dander: Secondary | ICD-10-CM | POA: Diagnosis not present

## 2018-12-07 DIAGNOSIS — J301 Allergic rhinitis due to pollen: Secondary | ICD-10-CM | POA: Diagnosis not present

## 2018-12-07 DIAGNOSIS — J3089 Other allergic rhinitis: Secondary | ICD-10-CM | POA: Diagnosis not present

## 2018-12-15 DIAGNOSIS — J3089 Other allergic rhinitis: Secondary | ICD-10-CM | POA: Diagnosis not present

## 2018-12-15 DIAGNOSIS — J301 Allergic rhinitis due to pollen: Secondary | ICD-10-CM | POA: Diagnosis not present

## 2018-12-15 DIAGNOSIS — J3081 Allergic rhinitis due to animal (cat) (dog) hair and dander: Secondary | ICD-10-CM | POA: Diagnosis not present

## 2018-12-21 DIAGNOSIS — J3089 Other allergic rhinitis: Secondary | ICD-10-CM | POA: Diagnosis not present

## 2018-12-21 DIAGNOSIS — J3081 Allergic rhinitis due to animal (cat) (dog) hair and dander: Secondary | ICD-10-CM | POA: Diagnosis not present

## 2018-12-21 DIAGNOSIS — J301 Allergic rhinitis due to pollen: Secondary | ICD-10-CM | POA: Diagnosis not present

## 2018-12-30 DIAGNOSIS — J301 Allergic rhinitis due to pollen: Secondary | ICD-10-CM | POA: Diagnosis not present

## 2018-12-30 DIAGNOSIS — J3089 Other allergic rhinitis: Secondary | ICD-10-CM | POA: Diagnosis not present

## 2018-12-30 DIAGNOSIS — J3081 Allergic rhinitis due to animal (cat) (dog) hair and dander: Secondary | ICD-10-CM | POA: Diagnosis not present

## 2019-01-03 DIAGNOSIS — J301 Allergic rhinitis due to pollen: Secondary | ICD-10-CM | POA: Diagnosis not present

## 2019-01-03 DIAGNOSIS — J3089 Other allergic rhinitis: Secondary | ICD-10-CM | POA: Diagnosis not present

## 2019-01-03 DIAGNOSIS — J3081 Allergic rhinitis due to animal (cat) (dog) hair and dander: Secondary | ICD-10-CM | POA: Diagnosis not present

## 2019-01-06 ENCOUNTER — Other Ambulatory Visit: Payer: Self-pay | Admitting: Family Medicine

## 2019-01-11 DIAGNOSIS — J301 Allergic rhinitis due to pollen: Secondary | ICD-10-CM | POA: Diagnosis not present

## 2019-01-11 DIAGNOSIS — J3081 Allergic rhinitis due to animal (cat) (dog) hair and dander: Secondary | ICD-10-CM | POA: Diagnosis not present

## 2019-01-11 DIAGNOSIS — J3089 Other allergic rhinitis: Secondary | ICD-10-CM | POA: Diagnosis not present

## 2019-01-18 DIAGNOSIS — J301 Allergic rhinitis due to pollen: Secondary | ICD-10-CM | POA: Diagnosis not present

## 2019-01-18 DIAGNOSIS — J3081 Allergic rhinitis due to animal (cat) (dog) hair and dander: Secondary | ICD-10-CM | POA: Diagnosis not present

## 2019-01-18 DIAGNOSIS — J3089 Other allergic rhinitis: Secondary | ICD-10-CM | POA: Diagnosis not present

## 2019-02-01 DIAGNOSIS — J3081 Allergic rhinitis due to animal (cat) (dog) hair and dander: Secondary | ICD-10-CM | POA: Diagnosis not present

## 2019-02-01 DIAGNOSIS — J301 Allergic rhinitis due to pollen: Secondary | ICD-10-CM | POA: Diagnosis not present

## 2019-02-01 DIAGNOSIS — J3089 Other allergic rhinitis: Secondary | ICD-10-CM | POA: Diagnosis not present

## 2019-02-08 DIAGNOSIS — J3081 Allergic rhinitis due to animal (cat) (dog) hair and dander: Secondary | ICD-10-CM | POA: Diagnosis not present

## 2019-02-08 DIAGNOSIS — J3089 Other allergic rhinitis: Secondary | ICD-10-CM | POA: Diagnosis not present

## 2019-02-08 DIAGNOSIS — J301 Allergic rhinitis due to pollen: Secondary | ICD-10-CM | POA: Diagnosis not present

## 2019-02-15 DIAGNOSIS — J3081 Allergic rhinitis due to animal (cat) (dog) hair and dander: Secondary | ICD-10-CM | POA: Diagnosis not present

## 2019-02-15 DIAGNOSIS — J3089 Other allergic rhinitis: Secondary | ICD-10-CM | POA: Diagnosis not present

## 2019-02-15 DIAGNOSIS — J301 Allergic rhinitis due to pollen: Secondary | ICD-10-CM | POA: Diagnosis not present

## 2019-02-22 ENCOUNTER — Ambulatory Visit (INDEPENDENT_AMBULATORY_CARE_PROVIDER_SITE_OTHER): Payer: Commercial Managed Care - PPO | Admitting: Physician Assistant

## 2019-02-22 ENCOUNTER — Encounter: Payer: Self-pay | Admitting: Physician Assistant

## 2019-02-22 ENCOUNTER — Other Ambulatory Visit: Payer: Self-pay

## 2019-02-22 DIAGNOSIS — J3081 Allergic rhinitis due to animal (cat) (dog) hair and dander: Secondary | ICD-10-CM | POA: Diagnosis not present

## 2019-02-22 DIAGNOSIS — J301 Allergic rhinitis due to pollen: Secondary | ICD-10-CM | POA: Diagnosis not present

## 2019-02-22 DIAGNOSIS — R300 Dysuria: Secondary | ICD-10-CM

## 2019-02-22 DIAGNOSIS — J3089 Other allergic rhinitis: Secondary | ICD-10-CM | POA: Diagnosis not present

## 2019-02-22 MED ORDER — CEPHALEXIN 500 MG PO CAPS: 500.0000 mg | ORAL_CAPSULE | Freq: Two times a day (BID) | ORAL | 0 refills | Status: AC

## 2019-02-22 NOTE — Progress Notes (Signed)
I have discussed the procedure for the virtual visit with the patient who has given consent to proceed with assessment and treatment.   Lorenza Winkleman S Brenan Modesto, CMA     

## 2019-02-22 NOTE — Patient Instructions (Signed)
Instructions sent to MyChart.   Your symptoms are consistent with a bladder infection, also called acute cystitis. Please take your antibiotic (Keflex) as directed until all pills are gone.  Stay very well hydrated.  Consider a daily probiotic (Align, Culturelle, or Activia) to help prevent stomach upset caused by the antibiotic.  Taking a probiotic daily may also help prevent recurrent UTIs.  Also consider taking AZO (Phenazopyridine) tablets to help decrease pain with urination.    Call or return to clinic if symptoms are not improving over the next couple of days or if anything worsens as this will warrant need for urine testing and reassessment.    Urinary Tract Infection A urinary tract infection (UTI) can occur any place along the urinary tract. The tract includes the kidneys, ureters, bladder, and urethra. A type of germ called bacteria often causes a UTI. UTIs are often helped with antibiotic medicine.  HOME CARE   If given, take antibiotics as told by your doctor. Finish them even if you start to feel better.  Drink enough fluids to keep your pee (urine) clear or pale yellow.  Avoid tea, drinks with caffeine, and bubbly (carbonated) drinks.  Pee often. Avoid holding your pee in for a long time.  Pee before and after having sex (intercourse).  Wipe from front to back after you poop (bowel movement) if you are a woman. Use each tissue only once. GET HELP RIGHT AWAY IF:   You have back pain.  You have lower belly (abdominal) pain.  You have chills.  You feel sick to your stomach (nauseous).  You throw up (vomit).  Your burning or discomfort with peeing does not go away.  You have a fever.  Your symptoms are not better in 3 days. MAKE SURE YOU:   Understand these instructions.  Will watch your condition.  Will get help right away if you are not doing well or get worse. Document Released: 04/21/2008 Document Revised: 07/28/2012 Document Reviewed: 06/03/2012  [facility] Patient Information 2015 [place], [place]. This information is not intended to replace advice given to you by your health care provider. Make sure you discuss any questions you have with your health care provider.

## 2019-02-22 NOTE — Progress Notes (Signed)
Virtual Visit via Video Note I connected with Sonia Little on 02/22/19 at 11:00 AM EDT by a video enabled telemedicine application and verified that I am speaking with the correct person using two identifiers.   I discussed the limitations of evaluation and management by telemedicine and the availability of in person appointments. The patient expressed understanding and agreed to proceed.  History of Present Illness: Patient presents to [facility] for acute concerns. Patent endorses 2-3 days of pain/burning with urination and suprapubic pressure. Endorses urgency and frequency. Denies hematuria. Notes cloudy urine with odor. Denies fever, chills. Denies flank pain.Denies change to bowel habits. Denies vaginal symptoms.   Observations/Objective: Patient is well-developed, well-nourished in no acute distress.  Resting comfortably in chair at home.  Head is normocephalic, atraumatic.  No labored breathing.  Speech is clear and coherent with logical contest.  Patient is alert and oriented at baseline.   Assessment and Plan: 1. Dysuria Symptoms seem classic for UTI. No alarm signs/symptoms. No recent ABX use. Will start empiric Keflex BID x 7 days. Supportive measures reviewed. If not improving in 48 hours or anything worsens, will need to come give get cup for urine sample to send for dip and culture.  - cephALEXin (KEFLEX) 500 MG capsule; Take 1 capsule (500 mg total) by mouth 2 (two) times daily for 7 days.  Dispense: 14 capsule; Refill: 0   Follow Up Instructions: Follow-up for UA/Culture if not improving with empiric treatment. Strict follow-up and ER precautions reviewed with patient.   I discussed the assessment and treatment plan with the patient. The patient was provided an opportunity to ask questions and all were answered. The patient agreed with the plan and demonstrated an understanding of the instructions.   The patient was advised to call back or seek an in-person evaluation if the  symptoms worsen or if the condition fails to improve as anticipated.   Leeanne Rio, PA-C

## 2019-02-24 DIAGNOSIS — J3081 Allergic rhinitis due to animal (cat) (dog) hair and dander: Secondary | ICD-10-CM | POA: Diagnosis not present

## 2019-02-24 DIAGNOSIS — J301 Allergic rhinitis due to pollen: Secondary | ICD-10-CM | POA: Diagnosis not present

## 2019-02-24 DIAGNOSIS — J3089 Other allergic rhinitis: Secondary | ICD-10-CM | POA: Diagnosis not present

## 2019-03-03 DIAGNOSIS — J3089 Other allergic rhinitis: Secondary | ICD-10-CM | POA: Diagnosis not present

## 2019-03-03 DIAGNOSIS — J3081 Allergic rhinitis due to animal (cat) (dog) hair and dander: Secondary | ICD-10-CM | POA: Diagnosis not present

## 2019-03-03 DIAGNOSIS — J301 Allergic rhinitis due to pollen: Secondary | ICD-10-CM | POA: Diagnosis not present

## 2019-03-09 DIAGNOSIS — J301 Allergic rhinitis due to pollen: Secondary | ICD-10-CM | POA: Diagnosis not present

## 2019-03-09 DIAGNOSIS — J3089 Other allergic rhinitis: Secondary | ICD-10-CM | POA: Diagnosis not present

## 2019-03-09 DIAGNOSIS — J3081 Allergic rhinitis due to animal (cat) (dog) hair and dander: Secondary | ICD-10-CM | POA: Diagnosis not present

## 2019-03-15 DIAGNOSIS — J301 Allergic rhinitis due to pollen: Secondary | ICD-10-CM | POA: Diagnosis not present

## 2019-03-15 DIAGNOSIS — J3089 Other allergic rhinitis: Secondary | ICD-10-CM | POA: Diagnosis not present

## 2019-03-15 DIAGNOSIS — J3081 Allergic rhinitis due to animal (cat) (dog) hair and dander: Secondary | ICD-10-CM | POA: Diagnosis not present

## 2019-03-23 DIAGNOSIS — J3081 Allergic rhinitis due to animal (cat) (dog) hair and dander: Secondary | ICD-10-CM | POA: Diagnosis not present

## 2019-03-23 DIAGNOSIS — J3089 Other allergic rhinitis: Secondary | ICD-10-CM | POA: Diagnosis not present

## 2019-03-23 DIAGNOSIS — J301 Allergic rhinitis due to pollen: Secondary | ICD-10-CM | POA: Diagnosis not present

## 2019-04-07 ENCOUNTER — Other Ambulatory Visit: Payer: Self-pay | Admitting: Family Medicine

## 2019-04-26 ENCOUNTER — Ambulatory Visit (INDEPENDENT_AMBULATORY_CARE_PROVIDER_SITE_OTHER): Payer: Commercial Managed Care - PPO | Admitting: Family Medicine

## 2019-04-26 ENCOUNTER — Encounter: Payer: Self-pay | Admitting: Family Medicine

## 2019-04-26 ENCOUNTER — Other Ambulatory Visit: Payer: Self-pay

## 2019-04-26 VITALS — BP 121/82 | HR 72 | Temp 98.0°F | Resp 17 | Ht 60.0 in | Wt 165.1 lb

## 2019-04-26 DIAGNOSIS — E119 Type 2 diabetes mellitus without complications: Secondary | ICD-10-CM | POA: Diagnosis not present

## 2019-04-26 DIAGNOSIS — E1169 Type 2 diabetes mellitus with other specified complication: Secondary | ICD-10-CM

## 2019-04-26 DIAGNOSIS — E785 Hyperlipidemia, unspecified: Secondary | ICD-10-CM

## 2019-04-26 DIAGNOSIS — Z0000 Encounter for general adult medical examination without abnormal findings: Secondary | ICD-10-CM | POA: Diagnosis not present

## 2019-04-26 LAB — BASIC METABOLIC PANEL
BUN: 11 mg/dL (ref 6–23)
CO2: 30 mEq/L (ref 19–32)
Calcium: 9.9 mg/dL (ref 8.4–10.5)
Chloride: 101 mEq/L (ref 96–112)
Creatinine, Ser: 0.62 mg/dL (ref 0.40–1.20)
GFR: 122.34 mL/min (ref >60.00)
Glucose, Bld: 120 mg/dL — ABNORMAL HIGH (ref 70–99)
Potassium: 4 mEq/L (ref 3.5–5.1)
Sodium: 138 mEq/L (ref 135–145)

## 2019-04-26 LAB — LIPID PANEL
Cholesterol: 283 mg/dL — ABNORMAL HIGH (ref 0–200)
HDL: 55.6 mg/dL (ref >39.00)
LDL Cholesterol: 208 mg/dL — ABNORMAL HIGH (ref 0–99)
NonHDL: 226.98
Total CHOL/HDL Ratio: 5
Triglycerides: 95 mg/dL (ref 0.0–149.0)
VLDL: 19 mg/dL (ref 0.0–40.0)

## 2019-04-26 LAB — CBC WITH DIFFERENTIAL/PLATELET
Basophils Absolute: 0 10*3/uL (ref 0.0–0.1)
Basophils Relative: 1.1 % (ref 0.0–3.0)
Eosinophils Absolute: 0.1 10*3/uL (ref 0.0–0.7)
Eosinophils Relative: 1.4 % (ref 0.0–5.0)
HCT: 39 % (ref 36.0–46.0)
Hemoglobin: 13 g/dL (ref 12.0–15.0)
Lymphocytes Relative: 50 % — ABNORMAL HIGH (ref 12.0–46.0)
Lymphs Abs: 2.2 10*3/uL (ref 0.7–4.0)
MCHC: 33.3 g/dL (ref 30.0–36.0)
MCV: 93 fl (ref 78.0–100.0)
Monocytes Absolute: 0.3 10*3/uL (ref 0.1–1.0)
Monocytes Relative: 6 % (ref 3.0–12.0)
Neutro Abs: 1.9 10*3/uL (ref 1.4–7.7)
Neutrophils Relative %: 41.5 % — ABNORMAL LOW (ref 43.0–77.0)
Platelets: 320 10*3/uL (ref 150.0–400.0)
RBC: 4.2 Mil/uL (ref 3.87–5.11)
RDW: 13.7 % (ref 11.5–15.5)
WBC: 4.5 10*3/uL (ref 4.0–10.5)

## 2019-04-26 LAB — HEPATIC FUNCTION PANEL
ALT: 17 U/L (ref 0–35)
AST: 14 U/L (ref 0–37)
Albumin: 4.5 g/dL (ref 3.5–5.2)
Alkaline Phosphatase: 56 U/L (ref 39–117)
Bilirubin, Direct: 0.1 mg/dL (ref 0.0–0.3)
Total Bilirubin: 0.6 mg/dL (ref 0.2–1.2)
Total Protein: 7 g/dL (ref 6.0–8.3)

## 2019-04-26 LAB — TSH: TSH: 2.79 u[IU]/mL (ref 0.35–4.50)

## 2019-04-26 LAB — HEMOGLOBIN A1C: Hgb A1c MFr Bld: 7.1 % — ABNORMAL HIGH (ref 4.6–6.5)

## 2019-04-26 NOTE — Assessment & Plan Note (Signed)
Chronic problem.  Hx of good control.  Asymptomatic.  UTD on eye exam, on ACE.  Foot exam done today.  Applauded her efforts at diet and exercise.  Check labs.  Adjust meds prn

## 2019-04-26 NOTE — Patient Instructions (Addendum)
Follow up in 6 months to recheck DM, cholesterol We'll notify you of your lab results and make any changes if needed Keep up the good work on healthy diet and regular exercise- you look great!!! Call with any questions or concerns Have a great summer and STAY SAFE!!!

## 2019-04-26 NOTE — Progress Notes (Signed)
   Subjective:    Patient ID: Sonia Little, female    DOB: [birth year], 53 y.o.   MRN: [id number]  HPI CPE- UTD on colonoscopy, eye exam, mammo.  No need for paps due to hysterectomy.  UTD on immunizations.  Due for foot exam.  Pt is now walking regularly   Review of Systems Patient reports no vision/ hearing changes, adenopathy,fever, weight change,  persistant/recurrent hoarseness , swallowing issues, chest pain, palpitations, edema, persistant/recurrent cough, hemoptysis, dyspnea (rest/exertional/paroxysmal nocturnal), gastrointestinal bleeding (melena, rectal bleeding), abdominal pain, significant heartburn, bowel changes, GU symptoms (dysuria, hematuria, incontinence), Gyn symptoms (abnormal  bleeding, pain),  syncope, focal weakness, memory loss, numbness & tingling, skin/hair/nail changes, abnormal bruising or bleeding, anxiety, or depression.     Objective:   Physical Exam General Appearance:    Alert, cooperative, no distress, appears stated age  Head:    Normocephalic, without obvious abnormality, atraumatic  Eyes:    PERRL, conjunctiva/corneas clear, EOM's intact, fundi    benign, both eyes  Ears:    Normal TM's and external ear canals, both ears  Nose:   Nares normal, septum midline, mucosa normal, no drainage    or sinus tenderness  Throat:   Lips, mucosa, and tongue normal; teeth and gums normal  Neck:   Supple, symmetrical, trachea midline, no adenopathy;    Thyroid: no enlargement/tenderness/nodules  Back:     Symmetric, no curvature, ROM normal, no CVA tenderness  Lungs:     Clear to auscultation bilaterally, respirations unlabored  Chest Wall:    No tenderness or deformity   Heart:    Regular rate and rhythm, S1 and S2 normal, no murmur, rub   or gallop  Breast Exam:    Deferred to mammo  Abdomen:     Soft, non-tender, bowel sounds active all four quadrants,    no masses, no organomegaly  Genitalia:    Deferred  Rectal:    Extremities:   Extremities normal,  atraumatic, no cyanosis or edema  Pulses:   2+ and symmetric all extremities  Skin:   Skin color, texture, turgor normal, no rashes or lesions  Lymph nodes:   Cervical, supraclavicular, and axillary nodes normal  Neurologic:   CNII-XII intact, normal strength, sensation and reflexes    throughout          Assessment & Plan:

## 2019-04-26 NOTE — Assessment & Plan Note (Signed)
Chronic problem.  Tolerating statin w/o difficulty.  Check labs.  Adjust meds prn  

## 2019-04-26 NOTE — Assessment & Plan Note (Signed)
Pt's PE WNL.  UTD on colonoscopy, immunizations, mammo.  Check labs.  Anticipatory guidance provided.

## 2019-06-25 ENCOUNTER — Other Ambulatory Visit: Payer: Self-pay | Admitting: Family Medicine

## 2019-08-15 ENCOUNTER — Other Ambulatory Visit: Payer: Self-pay

## 2019-08-15 ENCOUNTER — Ambulatory Visit (INDEPENDENT_AMBULATORY_CARE_PROVIDER_SITE_OTHER): Payer: Commercial Managed Care - PPO

## 2019-08-15 DIAGNOSIS — Z23 Encounter for immunization: Secondary | ICD-10-CM

## 2019-08-15 NOTE — Progress Notes (Signed)
Sonia Little 53 y.o. female presents to office today for annual flu shot and TDAP vaccine per Annye Asa, MD. Administered TDAP 0.5 mL IM left arm. Patient tolerated well.

## 2019-09-10 IMAGING — CT CT ABD-PELV W/ CM
2 of 5 series · 16 of 46 positions shown, 18 images · IV contrast (APPLIED)
Comparison: None.

CLINICAL DATA: Right lower quadrant pain, vomiting, and diarrhea
beginning this morning.

EXAM:
CT ABDOMEN AND PELVIS WITH CONTRAST
TECHNIQUE: Multidetector CT imaging of the abdomen and pelvis was performed
using the standard protocol following bolus administration of
intravenous contrast.
CONTRAST:  100mL [id number] IOPAMIDOL ([id number]) INJECTION 61%

[Series 3: abdomen 5.0 · axial · 0.68mm/px · z∈[+882,+1262]mm · 13 of 90 slices shown, 15 images]
[im 7/90  soft-tissue]
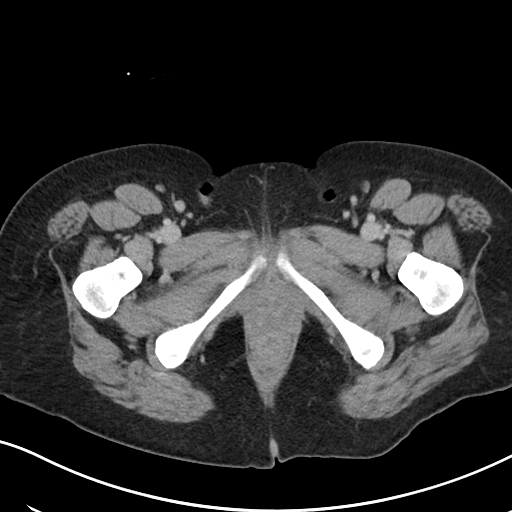
[im 7/90  bone]
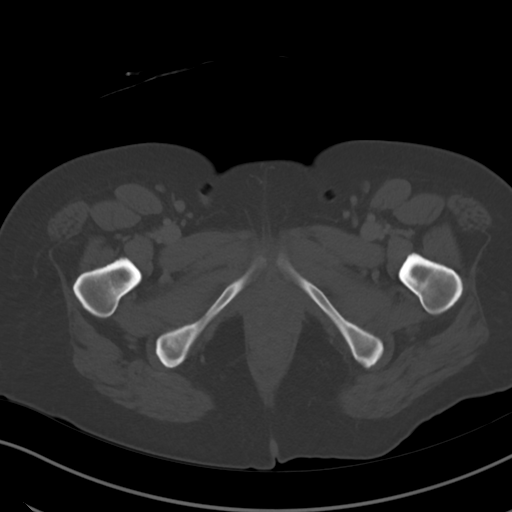
[im 13/90  soft-tissue]
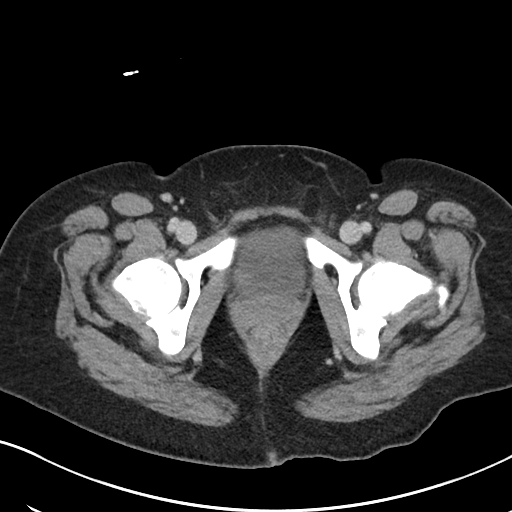
[im 20/90  soft-tissue]
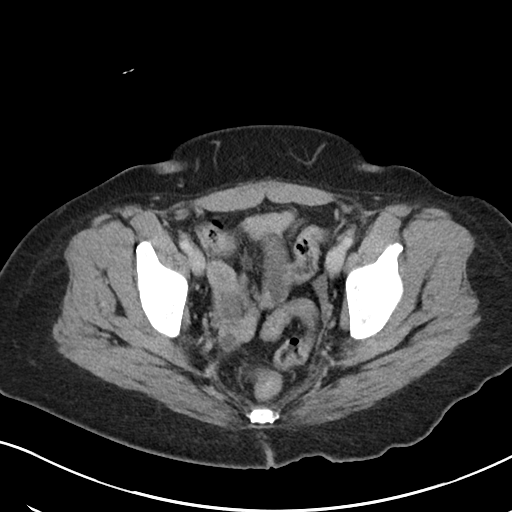
[im 26/90  soft-tissue]
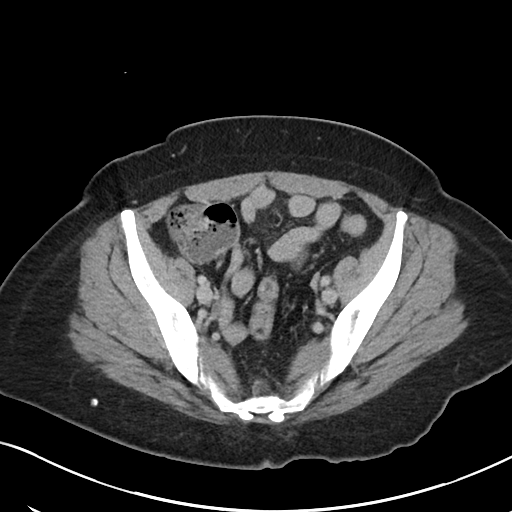
[im 32/90  soft-tissue]
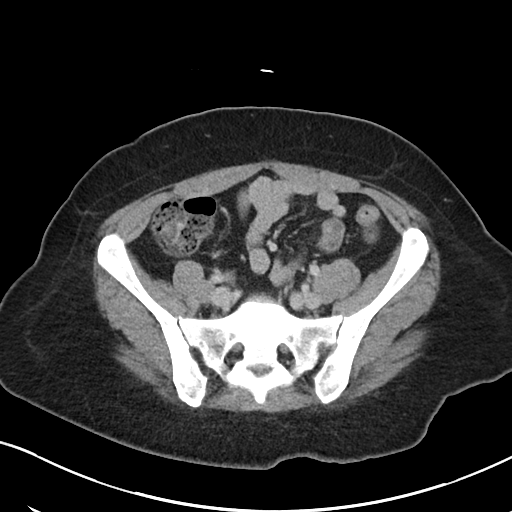
[im 39/90  soft-tissue]
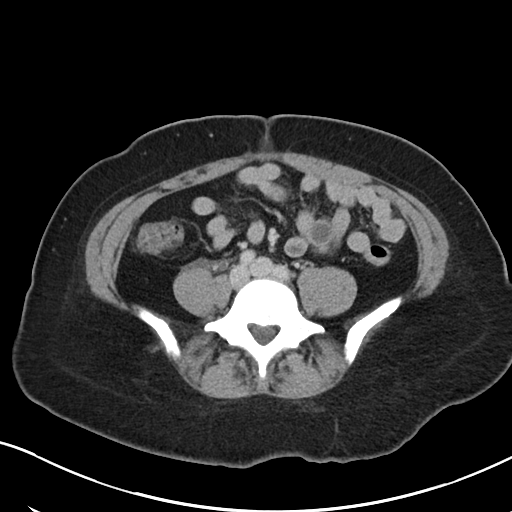
[im 45/90  soft-tissue]
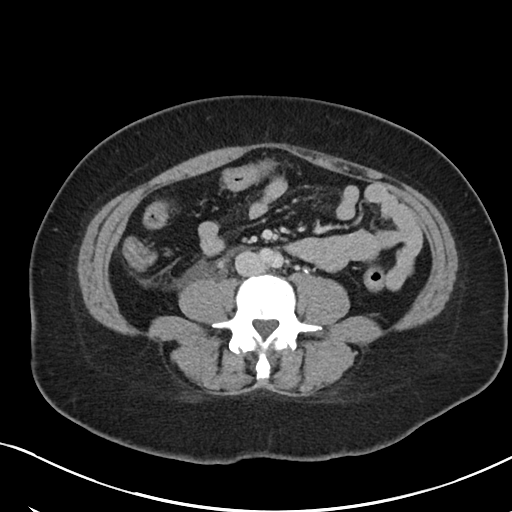
[im 51/90  soft-tissue]
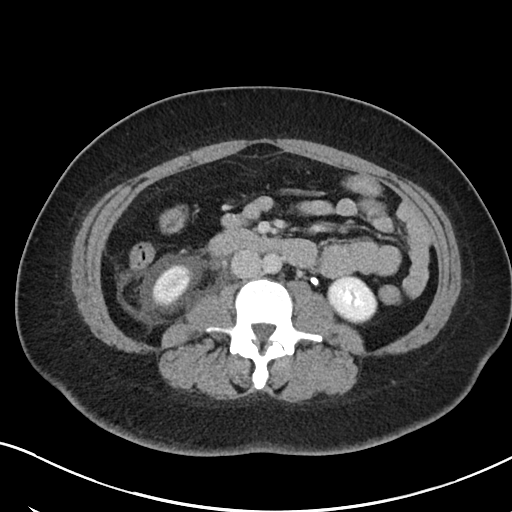
[im 58/90  soft-tissue]
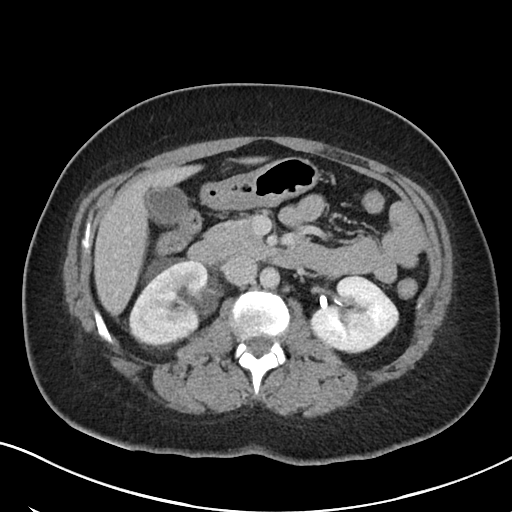
[im 58/90  bone]
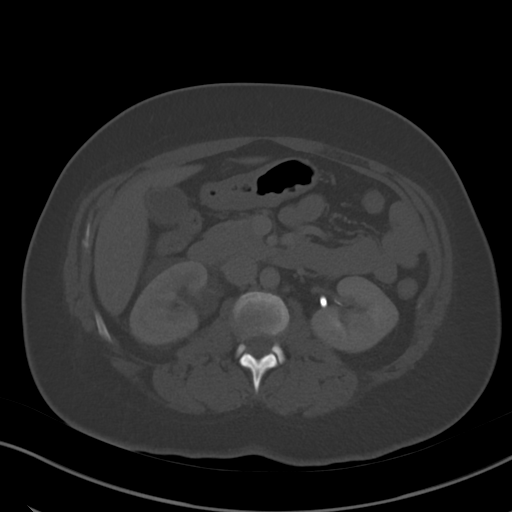
[im 64/90  soft-tissue]
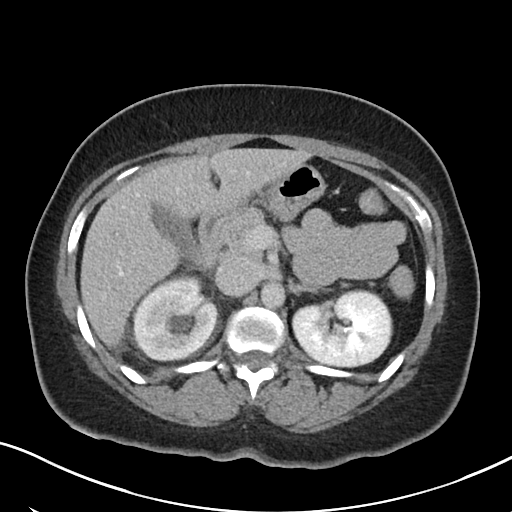
[im 70/90  soft-tissue]
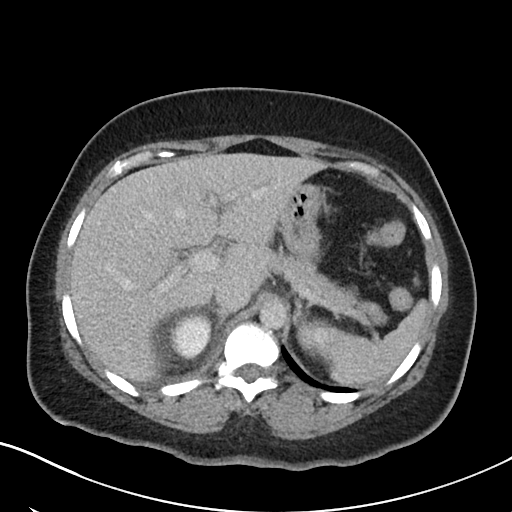
[im 77/90  soft-tissue]
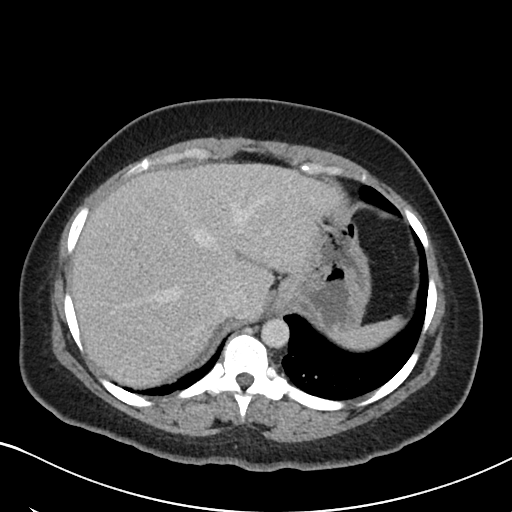
[im 83/90  soft-tissue]
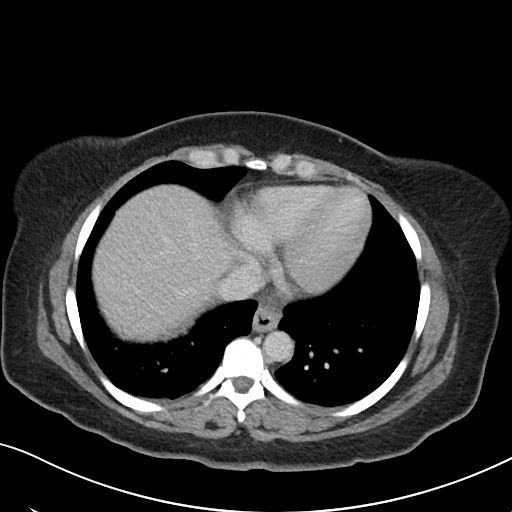

[Series 6: abdomen 3.0 mpr cor · coronal · 0.67mm/px · 3 of 89 slices shown]
[im 30/89  soft-tissue]
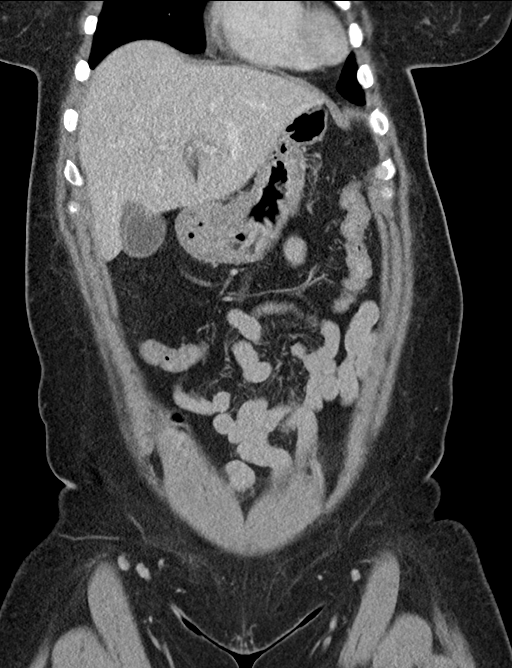
[im 40/89  soft-tissue]
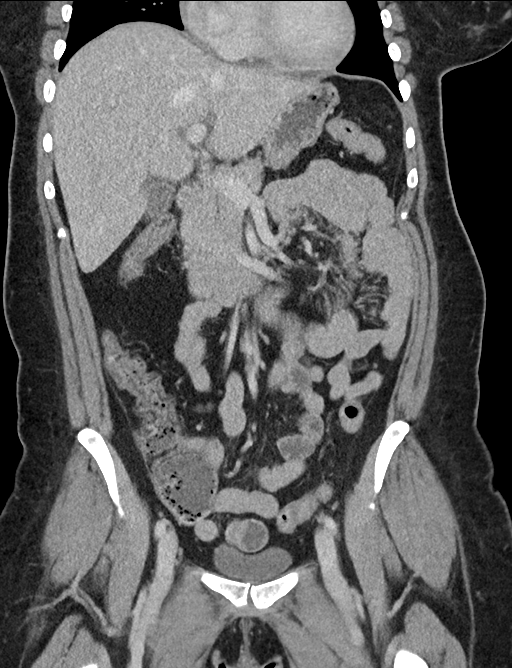
[im 49/89  soft-tissue]
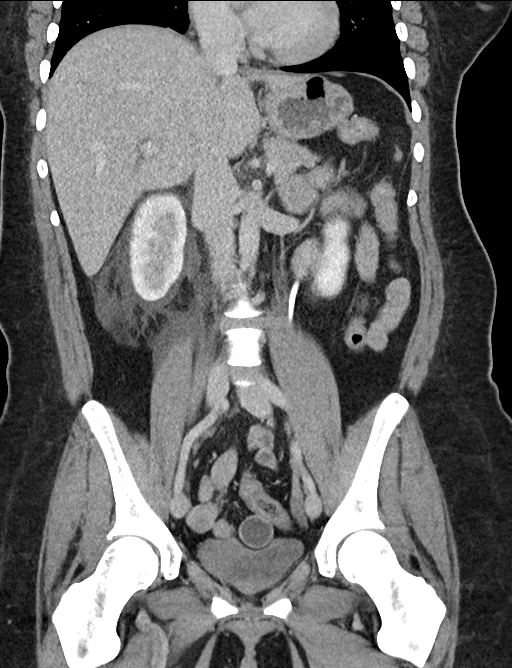

[16 of 46 positions shown; findings below may reference images not displayed]

FINDINGS: Lower Chest: Mild bibasilar atelectasis.

Hepatobiliary: No hepatic masses identified. Gallbladder is
unremarkable.

Pancreas:  No mass or inflammatory changes.

Spleen: Within normal limits in size and appearance.

Adrenals/Urinary Tract: Mild right hydroureteronephrosis is seen due
to a 2 mm calculus at the right ureterovesical junction. Mild right
perinephric stranding and fluid also seen. Delayed contrast
excretion by the right kidney is seen, but no renal parenchymal
lesions identified.

Stomach/Bowel: No evidence of obstruction, inflammatory process or
abnormal fluid collections.

Vascular/Lymphatic: No pathologically enlarged lymph nodes. No
abdominal aortic aneurysm.

Reproductive: Prior hysterectomy noted. Adnexal regions are
unremarkable in appearance.

Other:  None.

Musculoskeletal:  No suspicious bone lesions identified.
IMPRESSION: Mild right hydroureteronephrosis and perinephric fluid due to 2 mm
calculus at the right ureterovesical junction.

## 2019-09-24 ENCOUNTER — Other Ambulatory Visit: Payer: Self-pay | Admitting: Family Medicine

## 2019-10-10 ENCOUNTER — Other Ambulatory Visit: Payer: Self-pay | Admitting: Family Medicine

## 2019-10-10 DIAGNOSIS — Z1231 Encounter for screening mammogram for malignant neoplasm of breast: Secondary | ICD-10-CM

## 2019-10-10 LAB — HM DIABETES EYE EXAM

## 2019-10-11 ENCOUNTER — Encounter: Payer: Self-pay | Admitting: General Practice

## 2019-10-11 ENCOUNTER — Other Ambulatory Visit: Payer: Self-pay

## 2019-10-11 ENCOUNTER — Ambulatory Visit
Admission: RE | Admit: 2019-10-11 | Discharge: 2019-10-11 | Disposition: A | Discharge status: Home / Self Care | Payer: Commercial Managed Care - PPO | Source: Ambulatory Visit | Attending: Family Medicine | Admitting: Family Medicine

## 2019-10-11 DIAGNOSIS — Z1231 Encounter for screening mammogram for malignant neoplasm of breast: Secondary | ICD-10-CM

## 2019-10-26 ENCOUNTER — Ambulatory Visit (INDEPENDENT_AMBULATORY_CARE_PROVIDER_SITE_OTHER): Payer: Commercial Managed Care - PPO | Admitting: Family Medicine

## 2019-10-26 ENCOUNTER — Other Ambulatory Visit: Payer: Self-pay

## 2019-10-26 ENCOUNTER — Encounter: Payer: Self-pay | Admitting: Family Medicine

## 2019-10-26 DIAGNOSIS — E119 Type 2 diabetes mellitus without complications: Secondary | ICD-10-CM

## 2019-10-26 DIAGNOSIS — E785 Hyperlipidemia, unspecified: Secondary | ICD-10-CM

## 2019-10-26 DIAGNOSIS — E1169 Type 2 diabetes mellitus with other specified complication: Secondary | ICD-10-CM | POA: Diagnosis not present

## 2019-10-26 NOTE — Progress Notes (Signed)
I have discussed the procedure for the virtual visit with the patient who has given consent to proceed with assessment and treatment.   Pt unable to obtain vitals.  BS 112 this AM Sonia Little, CMA

## 2019-10-26 NOTE — Progress Notes (Signed)
Virtual Visit via Video   I connected with patient on 10/26/19 at  9:00 AM EST by a video enabled telemedicine application and verified that I am speaking with the correct person using two identifiers.  Location patient: Home Location provider: Acupuncturist, Office Persons participating in the virtual visit: Patient, Provider, [place] (Jess B)  I discussed the limitations of evaluation and management by telemedicine and the availability of in person appointments. The patient expressed understanding and agreed to proceed.  Subjective:   HPI:  DM- chronic problem, on Metformin XR 500mg  BID.  On ACE for renal protection.  UTD on foot exam, eye exam.  No CP, SOB, HAs, visual changes, abd pain, N/V.  No numbness/tingling or hands/feet (w/ exception of carpal tunnel).  Denies symptomatic lows.  Hyperlipidemia- chronic problem, on Lipitor 20mg  daily.  Pt would like to exercise more but she is not going to the gym and now the weather is colder.      ROS:   See pertinent positives and negatives per HPI.  Patient Active Problem List   Diagnosis Date Noted  . Physical exam 01/16/2015  . Hypopotassemia 03/30/2013  . Diabetes mellitus type II, controlled, with no complications ([place]) 99991111  . CARPAL TUNNEL SYNDROME 03/30/2008  . Hyperlipidemia associated with type 2 diabetes mellitus ([place]) 05/04/2007  . EXOPHTHALMOS NOS 04/02/2007    Social History   Tobacco Use  . Smoking status: Never Smoker  . Smokeless tobacco: Never Used  Substance Use Topics  . Alcohol use: No    Current Outpatient Medications:  .  albuterol (PROVENTIL HFA;VENTOLIN HFA) 108 (90 Base) MCG/ACT inhaler, TAKE 1 OR 2 PUFFS INTO THE LUNGS EVERY 6 HOURS AS NEEDED FOR COUGH OR WHEEZING, Disp: , Rfl: 0 .  atorvastatin (LIPITOR) 20 MG tablet, TAKE 1 TABLET BY MOUTH EVERY DAY, Disp: 90 tablet, Rfl: 1 .  AUVI-Q 0.3 MG/0.3ML SOAJ injection, INJECT AS NEEDED FOR SEVERE ALLERGIC REACTION INCLUDING ANAPHYLAXIS AS  DIRECTED, Disp: , Rfl: 1 .  BLACK COHOSH PO, Take 1 capsule by mouth daily., Disp: , Rfl:  .  cetirizine (ZYRTEC) 10 MG tablet, Take 10 mg by mouth daily., Disp: , Rfl:  .  Chromium Picolinate 200 MCG TABS, , Disp: , Rfl:  .  Cinnamon Bark POWD, , Disp: , Rfl:  .  fish oil-omega-3 fatty acids 1000 MG capsule, Take 2 g by mouth daily.  , Disp: , Rfl:  .  FLUOCINOLONE ACETONIDE SCALP 0.01 % OIL, Pt uses oil on scalp once a week., Disp: , Rfl:  .  ketoconazole (NIZORAL) 2 % shampoo, SMARTSIG:5 Milliliter(s) Topical 3 Times a Week, Disp: , Rfl:  .  metFORMIN (GLUCOPHAGE-XR) 500 MG 24 hr tablet, TAKE 1 TABLET BY MOUTH TWICE A DAY, Disp: 180 tablet, Rfl: 1 .  Multiple Vitamin (MULTIVITAMIN) tablet, Take 1 tablet by mouth daily. Diabetic combo pak , Disp: , Rfl:  .  Olopatadine HCl 0.2 % SOLN, INSTILL 1 DROP INTO AFFECTED EYE EVERY DAY, Disp: , Rfl: 3 .  ramipril (ALTACE) 2.5 MG capsule, TAKE 1 CAPSULE BY MOUTH EVERY DAY, Disp: 90 capsule, Rfl: 1  Allergies  Allergen Reactions  . Aspirin     REACTION: nasal obstruction  . Simvastatin     REACTION: reflux    Objective:   There were no vitals taken for this visit. AAOx3, NAD NCAT, EOMI No obvious CN deficits Coloring WNL Pt is able to speak clearly, coherently without shortness of breath or increased work of breathing.  Thought process is linear.  Mood is appropriate.   Assessment and Plan:   Hyperlipidemia- chronic problem.  Tolerating statin w/o difficulty.  Check labs.  Adjust meds prn   DM- chronic problem, tolerating Metformin w/o difficulty.  UTD on foot exam, eye exam.  On ACE for renal protection.  Encouraged healthy diet and regular exercise.  Check labs.  Adjust meds prn   Annye Asa, MD 10/26/2019

## 2019-10-28 ENCOUNTER — Ambulatory Visit (INDEPENDENT_AMBULATORY_CARE_PROVIDER_SITE_OTHER): Payer: Commercial Managed Care - PPO

## 2019-10-28 ENCOUNTER — Other Ambulatory Visit: Payer: Self-pay

## 2019-10-28 DIAGNOSIS — E785 Hyperlipidemia, unspecified: Secondary | ICD-10-CM | POA: Diagnosis not present

## 2019-10-28 DIAGNOSIS — E1169 Type 2 diabetes mellitus with other specified complication: Secondary | ICD-10-CM | POA: Diagnosis not present

## 2019-10-28 DIAGNOSIS — E119 Type 2 diabetes mellitus without complications: Secondary | ICD-10-CM | POA: Diagnosis not present

## 2019-10-28 LAB — HEPATIC FUNCTION PANEL
ALT: 18 U/L (ref 0–35)
AST: 14 U/L (ref 0–37)
Albumin: 4.5 g/dL (ref 3.5–5.2)
Alkaline Phosphatase: 54 U/L (ref 39–117)
Bilirubin, Direct: 0.1 mg/dL (ref 0.0–0.3)
Total Bilirubin: 0.7 mg/dL (ref 0.2–1.2)
Total Protein: 6.9 g/dL (ref 6.0–8.3)

## 2019-10-28 LAB — LIPID PANEL
Cholesterol: 227 mg/dL — ABNORMAL HIGH (ref 0–200)
HDL: 49.6 mg/dL (ref >39.00)
LDL Cholesterol: 160 mg/dL — ABNORMAL HIGH (ref 0–99)
NonHDL: 176.98
Total CHOL/HDL Ratio: 5
Triglycerides: 83 mg/dL (ref 0.0–149.0)
VLDL: 16.6 mg/dL (ref 0.0–40.0)

## 2019-10-28 LAB — CBC WITH DIFFERENTIAL/PLATELET
Basophils Absolute: 0.1 10*3/uL (ref 0.0–0.1)
Basophils Relative: 1.4 % (ref 0.0–3.0)
Eosinophils Absolute: 0.1 10*3/uL (ref 0.0–0.7)
Eosinophils Relative: 1.3 % (ref 0.0–5.0)
HCT: 39.1 % (ref 36.0–46.0)
Hemoglobin: 12.6 g/dL (ref 12.0–15.0)
Lymphocytes Relative: 56 % — ABNORMAL HIGH (ref 12.0–46.0)
Lymphs Abs: 2.4 10*3/uL (ref 0.7–4.0)
MCHC: 32.3 g/dL (ref 30.0–36.0)
MCV: 94.5 fl (ref 78.0–100.0)
Monocytes Absolute: 0.2 10*3/uL (ref 0.1–1.0)
Monocytes Relative: 4.9 % (ref 3.0–12.0)
Neutro Abs: 1.6 10*3/uL (ref 1.4–7.7)
Neutrophils Relative %: 36.4 % — ABNORMAL LOW (ref 43.0–77.0)
Platelets: 305 10*3/uL (ref 150.0–400.0)
RBC: 4.14 Mil/uL (ref 3.87–5.11)
RDW: 13.9 % (ref 11.5–15.5)
WBC: 4.4 10*3/uL (ref 4.0–10.5)

## 2019-10-28 LAB — TSH: TSH: 2.25 u[IU]/mL (ref 0.35–4.50)

## 2019-10-28 LAB — HEMOGLOBIN A1C: Hgb A1c MFr Bld: 6.7 % — ABNORMAL HIGH (ref 4.6–6.5)

## 2019-10-28 LAB — BASIC METABOLIC PANEL
BUN: 9 mg/dL (ref 6–23)
CO2: 30 mEq/L (ref 19–32)
Calcium: 9.4 mg/dL (ref 8.4–10.5)
Chloride: 103 mEq/L (ref 96–112)
Creatinine, Ser: 0.63 mg/dL (ref 0.40–1.20)
GFR: 119.86 mL/min (ref >60.00)
Glucose, Bld: 111 mg/dL — ABNORMAL HIGH (ref 70–99)
Potassium: 4 mEq/L (ref 3.5–5.1)
Sodium: 139 mEq/L (ref 135–145)

## 2019-12-25 ENCOUNTER — Other Ambulatory Visit: Payer: Self-pay | Admitting: Family Medicine

## 2020-01-21 ENCOUNTER — Ambulatory Visit: Payer: Commercial Managed Care - PPO | Attending: Internal Medicine

## 2020-01-21 DIAGNOSIS — Z23 Encounter for immunization: Secondary | ICD-10-CM | POA: Insufficient documentation

## 2020-01-21 NOTE — Progress Notes (Signed)
   Covid-19 Vaccination Clinic  Name:  Sonia Little    MRN: [id number] DOB: 1966-06-29  01/21/2020  Sonia Little was observed post Covid-19 immunization for 15 minutes without incident. She was provided with Vaccine Information Sheet and instruction to access the V-Safe system.   Sonia Little was instructed to call 911 with any severe reactions post vaccine: Marland Kitchen Difficulty breathing  . Swelling of face and throat  . A fast heartbeat  . A bad rash all over body  . Dizziness and weakness   Immunizations Administered    Name Date Dose VIS Date Route   Pfizer COVID-19 Vaccine 01/21/2020 12:54 PM 0.3 mL 10/28/2019 Intramuscular   Manufacturer: [place]   Lot: [id number]   [place]: [id number]

## 2020-02-11 ENCOUNTER — Ambulatory Visit: Payer: Commercial Managed Care - PPO | Attending: Internal Medicine

## 2020-02-11 DIAGNOSIS — Z23 Encounter for immunization: Secondary | ICD-10-CM

## 2020-02-11 NOTE — Progress Notes (Signed)
   Covid-19 Vaccination Clinic  Name:  Aryahi Snitker    MRN: [id number] DOB: Apr 05, 1966  02/11/2020  Ms. Ausherman was observed post Covid-19 immunization for 15 minutes without incident. She was provided with Vaccine Information Sheet and instruction to access the V-Safe system.   Ms. Zamot was instructed to call 911 with any severe reactions post vaccine: Marland Kitchen Difficulty breathing  . Swelling of face and throat  . A fast heartbeat  . A bad rash all over body  . Dizziness and weakness   Immunizations Administered    Name Date Dose VIS Date Route   Pfizer COVID-19 Vaccine 02/11/2020 12:16 PM 0.3 mL 10/28/2019 Intramuscular   Manufacturer: [place]   Lot: [id number]   [place]: [id number]

## 2020-03-19 ENCOUNTER — Other Ambulatory Visit: Payer: Self-pay | Admitting: Family Medicine

## 2020-04-27 ENCOUNTER — Encounter: Payer: Commercial Managed Care - PPO | Admitting: Family Medicine

## 2020-05-02 ENCOUNTER — Encounter: Payer: Self-pay | Admitting: Family Medicine

## 2020-05-02 ENCOUNTER — Other Ambulatory Visit: Payer: Self-pay

## 2020-05-02 ENCOUNTER — Ambulatory Visit (INDEPENDENT_AMBULATORY_CARE_PROVIDER_SITE_OTHER): Payer: Commercial Managed Care - PPO | Admitting: Family Medicine

## 2020-05-02 VITALS — BP 125/82 | HR 69 | Temp 97.3°F | Resp 16 | Ht 60.0 in | Wt 162.2 lb

## 2020-05-02 DIAGNOSIS — E119 Type 2 diabetes mellitus without complications: Secondary | ICD-10-CM | POA: Diagnosis not present

## 2020-05-02 DIAGNOSIS — Z0000 Encounter for general adult medical examination without abnormal findings: Secondary | ICD-10-CM | POA: Diagnosis not present

## 2020-05-02 DIAGNOSIS — E1169 Type 2 diabetes mellitus with other specified complication: Secondary | ICD-10-CM | POA: Diagnosis not present

## 2020-05-02 DIAGNOSIS — E785 Hyperlipidemia, unspecified: Secondary | ICD-10-CM | POA: Diagnosis not present

## 2020-05-02 NOTE — Assessment & Plan Note (Signed)
Chronic problem.  Hx of good control.  Asymptomatic.  Check labs.  Adjust meds prn

## 2020-05-02 NOTE — Patient Instructions (Signed)
Follow up in 6 months to recheck BP, cholesterol, diabetes We'll notify you of your lab results and make any changes if needed Continue to work on healthy diet and regular exercise- you can do it! Call with any questions or concerns Hang in there!! Have a great summer!!!

## 2020-05-02 NOTE — Assessment & Plan Note (Signed)
Pt's PE unchanged from previous and WNL w/ exception of obesity and exophthalmos.  UTD on colonoscopy, mammo, immunizations.  Check labs.  Anticipatory guidance provided.

## 2020-05-02 NOTE — Progress Notes (Signed)
   Subjective:    Patient ID: Sonia Little, female    DOB: 1966-02-22, 54 y.o.   MRN: [id number]  HPI CPE- UTD on mammo, colonoscopy, eye exam, immunizations.  Due for foot exam.  On ACE for renal protection.  No concerns today   Review of Systems Patient reports no vision/ hearing changes, adenopathy,fever, weight change,  persistant/recurrent hoarseness , swallowing issues, chest pain, palpitations, edema, persistant/recurrent cough, hemoptysis, dyspnea (rest/exertional/paroxysmal nocturnal), gastrointestinal bleeding (melena, rectal bleeding), abdominal pain, significant heartburn, bowel changes, GU symptoms (dysuria, hematuria, incontinence), Gyn symptoms (abnormal  bleeding, pain),  syncope, focal weakness, memory loss, numbness & tingling, skin/hair/nail changes, abnormal bruising or bleeding, anxiety, or depression.   This visit occurred during the SARS-CoV-2 public health emergency.  Safety protocols were in place, including screening questions prior to the visit, additional usage of staff PPE, and extensive cleaning of exam room while observing appropriate contact time as indicated for disinfecting solutions.       Objective:   Physical Exam General Appearance:    Alert, cooperative, no distress, appears stated age  Head:    Normocephalic, without obvious abnormality, atraumatic  Eyes:    PERRL, conjunctiva/corneas clear, EOM's intact, fundi    benign, both eyes  Ears:    Normal TM's and external ear canals, both ears  Nose:   Deferred due to COVID  Throat:   Neck:   Supple, symmetrical, trachea midline, no adenopathy;    Thyroid: no enlargement/tenderness/nodules  Back:     Symmetric, no curvature, ROM normal, no CVA tenderness  Lungs:     Clear to auscultation bilaterally, respirations unlabored  Chest Wall:    No tenderness or deformity   Heart:    Regular rate and rhythm, S1 and S2 normal, no murmur, rub   or gallop  Breast Exam:    Deferred to mammo  Abdomen:      Soft, non-tender, bowel sounds active all four quadrants,    no masses, no organomegaly  Genitalia:    Deferred  Rectal:    Extremities:   Extremities normal, atraumatic, no cyanosis or edema  Pulses:   2+ and symmetric all extremities  Skin:   Skin color, texture, turgor normal, no rashes or lesions  Lymph nodes:   Cervical, supraclavicular, and axillary nodes normal  Neurologic:   CNII-XII intact, normal strength, sensation and reflexes    throughout          Assessment & Plan:

## 2020-05-02 NOTE — Assessment & Plan Note (Signed)
Chronic problem.  Tolerating statin w/o difficulty.  Check labs.  Adjust meds prn  

## 2020-05-03 LAB — HEPATIC FUNCTION PANEL
ALT: 16 U/L (ref 0–35)
AST: 12 U/L (ref 0–37)
Albumin: 4.7 g/dL (ref 3.5–5.2)
Alkaline Phosphatase: 51 U/L (ref 39–117)
Bilirubin, Direct: 0.1 mg/dL (ref 0.0–0.3)
Total Bilirubin: 0.5 mg/dL (ref 0.2–1.2)
Total Protein: 7.1 g/dL (ref 6.0–8.3)

## 2020-05-03 LAB — CBC WITH DIFFERENTIAL/PLATELET
Basophils Absolute: 0 10*3/uL (ref 0.0–0.1)
Basophils Relative: 0.8 % (ref 0.0–3.0)
Eosinophils Absolute: 0 10*3/uL (ref 0.0–0.7)
Eosinophils Relative: 0.9 % (ref 0.0–5.0)
HCT: 38.8 % (ref 36.0–46.0)
Hemoglobin: 13.1 g/dL (ref 12.0–15.0)
Lymphocytes Relative: 42.8 % (ref 12.0–46.0)
Lymphs Abs: 2.4 10*3/uL (ref 0.7–4.0)
MCHC: 33.6 g/dL (ref 30.0–36.0)
MCV: 93.9 fl (ref 78.0–100.0)
Monocytes Absolute: 0.3 10*3/uL (ref 0.1–1.0)
Monocytes Relative: 4.9 % (ref 3.0–12.0)
Neutro Abs: 2.9 10*3/uL (ref 1.4–7.7)
Neutrophils Relative %: 50.6 % (ref 43.0–77.0)
Platelets: 335 10*3/uL (ref 150.0–400.0)
RBC: 4.14 Mil/uL (ref 3.87–5.11)
RDW: 13.3 % (ref 11.5–15.5)
WBC: 5.7 10*3/uL (ref 4.0–10.5)

## 2020-05-03 LAB — LIPID PANEL
Cholesterol: 235 mg/dL — ABNORMAL HIGH (ref 0–200)
HDL: 51.8 mg/dL (ref >39.00)
LDL Cholesterol: 158 mg/dL — ABNORMAL HIGH (ref 0–99)
NonHDL: 182.84
Total CHOL/HDL Ratio: 5
Triglycerides: 125 mg/dL (ref 0.0–149.0)
VLDL: 25 mg/dL (ref 0.0–40.0)

## 2020-05-03 LAB — BASIC METABOLIC PANEL
BUN: 11 mg/dL (ref 6–23)
CO2: 30 mEq/L (ref 19–32)
Calcium: 9.9 mg/dL (ref 8.4–10.5)
Chloride: 102 mEq/L (ref 96–112)
Creatinine, Ser: 0.59 mg/dL (ref 0.40–1.20)
GFR: 129.03 mL/min (ref >60.00)
Glucose, Bld: 113 mg/dL — ABNORMAL HIGH (ref 70–99)
Potassium: 4 mEq/L (ref 3.5–5.1)
Sodium: 140 mEq/L (ref 135–145)

## 2020-05-03 LAB — TSH: TSH: 2.29 u[IU]/mL (ref 0.35–4.50)

## 2020-05-03 LAB — HEMOGLOBIN A1C: Hgb A1c MFr Bld: 6.8 % — ABNORMAL HIGH (ref 4.6–6.5)

## 2020-05-04 ENCOUNTER — Encounter: Payer: Self-pay | Admitting: Family Medicine

## 2020-05-04 MED ORDER — ATORVASTATIN CALCIUM 40 MG PO TABS
40.0000 mg | ORAL_TABLET | Freq: Every day | ORAL | 1 refills | Status: DC
Start: 2020-05-04 — End: 2020-10-31

## 2020-06-29 ENCOUNTER — Other Ambulatory Visit: Payer: Self-pay | Admitting: Family Medicine

## 2020-08-15 ENCOUNTER — Other Ambulatory Visit: Payer: Self-pay

## 2020-08-15 ENCOUNTER — Telehealth (INDEPENDENT_AMBULATORY_CARE_PROVIDER_SITE_OTHER): Payer: Commercial Managed Care - PPO | Admitting: Family Medicine

## 2020-08-15 ENCOUNTER — Encounter: Payer: Self-pay | Admitting: Family Medicine

## 2020-08-15 VITALS — BP 128/68 | HR 102 | Temp 97.9°F

## 2020-08-15 DIAGNOSIS — R42 Dizziness and giddiness: Secondary | ICD-10-CM | POA: Diagnosis not present

## 2020-08-15 MED ORDER — MECLIZINE HCL 25 MG PO TABS: 25.0000 mg | ORAL_TABLET | Freq: Three times a day (TID) | ORAL | 0 refills | Status: DC | PRN

## 2020-08-15 MED ORDER — ONDANSETRON HCL 4 MG PO TABS: 4.0000 mg | ORAL_TABLET | Freq: Three times a day (TID) | ORAL | 0 refills | Status: DC | PRN

## 2020-08-15 NOTE — Progress Notes (Signed)
I have discussed the procedure for the virtual visit with the patient who has given consent to proceed with assessment and treatment.   Dinesh Ulysse L Anden Bartolo, CMA     

## 2020-08-15 NOTE — Progress Notes (Signed)
Virtual Visit via Video   I connected with patient on 08/15/20 at  2:30 PM EDT by a video enabled telemedicine application and verified that I am speaking with the correct person using two identifiers.  Location patient: Home Location provider: Acupuncturist, Office Persons participating in the virtual visit: Patient, Provider, [place] (Jess B)  I discussed the limitations of evaluation and management by telemedicine and the availability of in person appointments. The patient expressed understanding and agreed to proceed.  Subjective:   HPI:  Dizziness- 'I think the vertigo hit me'.  This morning 'my head was going round and round'.  Broke out in a sweat, vomited x1.  Diarrhea 'a couple of times'.  Was able to lie down and sxs 'eased off'.  Took motion sickness medication w/ some improvement.  Sxs are worse w/ turning head, R>L.  Room will start to spin.  Pt had similar episode ~5 yrs ago.  Pt reports she had sinus issues last week that have since improved.    ROS:   See pertinent positives and negatives per HPI.  Patient Active Problem List   Diagnosis Date Noted  . Physical exam 01/16/2015  . Hypopotassemia 03/30/2013  . Diabetes mellitus type II, controlled, with no complications ([place]) 25/36/6440  . CARPAL TUNNEL SYNDROME 03/30/2008  . Hyperlipidemia associated with type 2 diabetes mellitus ([place]) 05/04/2007  . EXOPHTHALMOS NOS 04/02/2007    Social History   Tobacco Use  . Smoking status: Never Smoker  . Smokeless tobacco: Never Used  Substance Use Topics  . Alcohol use: No    Current Outpatient Medications:  .  albuterol (PROVENTIL HFA;VENTOLIN HFA) 108 (90 Base) MCG/ACT inhaler, TAKE 1 OR 2 PUFFS INTO THE LUNGS EVERY 6 HOURS AS NEEDED FOR COUGH OR WHEEZING, Disp: , Rfl: 0 .  atorvastatin (LIPITOR) 40 MG tablet, Take 1 tablet (40 mg total) by mouth daily., Disp: 90 tablet, Rfl: 1 .  AUVI-Q 0.3 MG/0.3ML SOAJ injection, INJECT AS NEEDED FOR SEVERE ALLERGIC REACTION  INCLUDING ANAPHYLAXIS AS DIRECTED, Disp: , Rfl: 1 .  BLACK COHOSH PO, Take 1 capsule by mouth daily., Disp: , Rfl:  .  cetirizine (ZYRTEC) 10 MG tablet, Take 10 mg by mouth daily., Disp: , Rfl:  .  Chromium Picolinate 200 MCG TABS, , Disp: , Rfl:  .  Cinnamon Bark POWD, , Disp: , Rfl:  .  fish oil-omega-3 fatty acids 1000 MG capsule, Take 2 g by mouth daily.  , Disp: , Rfl:  .  FLUOCINOLONE ACETONIDE SCALP 0.01 % OIL, Pt uses oil on scalp once a week., Disp: , Rfl:  .  ketoconazole (NIZORAL) 2 % shampoo, SMARTSIG:5 Milliliter(s) Topical 3 Times a Week, Disp: , Rfl:  .  metFORMIN (GLUCOPHAGE-XR) 500 MG 24 hr tablet, TAKE 1 TABLET BY MOUTH TWICE A DAY, Disp: 180 tablet, Rfl: 1 .  Multiple Vitamin (MULTIVITAMIN) tablet, Take 1 tablet by mouth daily. Diabetic combo pak , Disp: , Rfl:  .  Olopatadine HCl 0.2 % SOLN, INSTILL 1 DROP INTO AFFECTED EYE EVERY DAY, Disp: , Rfl: 3 .  ramipril (ALTACE) 2.5 MG capsule, TAKE 1 CAPSULE BY MOUTH EVERY DAY, Disp: 90 capsule, Rfl: 1  Allergies  Allergen Reactions  . Aspirin     REACTION: nasal obstruction  . Simvastatin     REACTION: reflux    Objective:   BP 128/68   Pulse (!) 102   Temp 97.9 F (36.6 C) (Oral)  AAOx3, NAD NCAT, EOMI No obvious CN deficits Coloring  WNL Pt is able to speak clearly, coherently without shortness of breath or increased work of breathing.  Thought process is linear.  Mood is appropriate.   Assessment and Plan:   Vertigo- new to provider, pt has hx of similar.  PE unremarkable but pt sitting very still.  Start Meclizine, zofran prn.  Encouraged fluids, slow position changes.  Reviewed supportive care and red flags that should prompt return.  Pt expressed understanding and is in agreement w/ plan.    Annye Asa, MD 08/15/2020

## 2020-08-23 ENCOUNTER — Other Ambulatory Visit: Payer: Self-pay

## 2020-08-23 ENCOUNTER — Ambulatory Visit (INDEPENDENT_AMBULATORY_CARE_PROVIDER_SITE_OTHER): Payer: Commercial Managed Care - PPO

## 2020-08-23 DIAGNOSIS — Z23 Encounter for immunization: Secondary | ICD-10-CM | POA: Diagnosis not present

## 2020-09-06 ENCOUNTER — Other Ambulatory Visit: Payer: Self-pay | Admitting: Family Medicine

## 2020-09-06 DIAGNOSIS — Z1231 Encounter for screening mammogram for malignant neoplasm of breast: Secondary | ICD-10-CM

## 2020-09-21 ENCOUNTER — Other Ambulatory Visit: Payer: Self-pay | Admitting: Family Medicine

## 2020-10-16 ENCOUNTER — Ambulatory Visit: Payer: Commercial Managed Care - PPO

## 2020-10-31 ENCOUNTER — Ambulatory Visit (INDEPENDENT_AMBULATORY_CARE_PROVIDER_SITE_OTHER): Payer: Commercial Managed Care - PPO | Admitting: Family Medicine

## 2020-10-31 ENCOUNTER — Other Ambulatory Visit (INDEPENDENT_AMBULATORY_CARE_PROVIDER_SITE_OTHER): Payer: Commercial Managed Care - PPO

## 2020-10-31 ENCOUNTER — Other Ambulatory Visit: Payer: Self-pay | Admitting: Family Medicine

## 2020-10-31 ENCOUNTER — Encounter: Payer: Self-pay | Admitting: Family Medicine

## 2020-10-31 ENCOUNTER — Other Ambulatory Visit: Payer: Self-pay

## 2020-10-31 VITALS — BP 128/80 | HR 87 | Temp 97.6°F | Resp 17 | Ht 60.0 in | Wt 164.6 lb

## 2020-10-31 DIAGNOSIS — J309 Allergic rhinitis, unspecified: Secondary | ICD-10-CM | POA: Insufficient documentation

## 2020-10-31 DIAGNOSIS — J4520 Mild intermittent asthma, uncomplicated: Secondary | ICD-10-CM | POA: Insufficient documentation

## 2020-10-31 DIAGNOSIS — E669 Obesity, unspecified: Secondary | ICD-10-CM

## 2020-10-31 DIAGNOSIS — E785 Hyperlipidemia, unspecified: Secondary | ICD-10-CM | POA: Diagnosis not present

## 2020-10-31 DIAGNOSIS — E119 Type 2 diabetes mellitus without complications: Secondary | ICD-10-CM | POA: Diagnosis not present

## 2020-10-31 DIAGNOSIS — J3081 Allergic rhinitis due to animal (cat) (dog) hair and dander: Secondary | ICD-10-CM | POA: Insufficient documentation

## 2020-10-31 DIAGNOSIS — J301 Allergic rhinitis due to pollen: Secondary | ICD-10-CM | POA: Insufficient documentation

## 2020-10-31 DIAGNOSIS — H1045 Other chronic allergic conjunctivitis: Secondary | ICD-10-CM | POA: Insufficient documentation

## 2020-10-31 DIAGNOSIS — E1169 Type 2 diabetes mellitus with other specified complication: Secondary | ICD-10-CM | POA: Diagnosis not present

## 2020-10-31 LAB — LIPID PANEL
Cholesterol: 173 mg/dL (ref 0–200)
HDL: 53.9 mg/dL (ref >39.00)
LDL Cholesterol: 106 mg/dL — ABNORMAL HIGH (ref 0–99)
NonHDL: 118.99
Total CHOL/HDL Ratio: 3
Triglycerides: 67 mg/dL (ref 0.0–149.0)
VLDL: 13.4 mg/dL (ref 0.0–40.0)

## 2020-10-31 LAB — CBC WITH DIFFERENTIAL/PLATELET
Basophils Absolute: 0.1 10*3/uL (ref 0.0–0.1)
Basophils Relative: 1.2 % (ref 0.0–3.0)
Eosinophils Absolute: 0 10*3/uL (ref 0.0–0.7)
Eosinophils Relative: 0.7 % (ref 0.0–5.0)
HCT: 39.1 % (ref 36.0–46.0)
Hemoglobin: 13 g/dL (ref 12.0–15.0)
Lymphocytes Relative: 55.2 % — ABNORMAL HIGH (ref 12.0–46.0)
Lymphs Abs: 2.5 10*3/uL (ref 0.7–4.0)
MCHC: 33.2 g/dL (ref 30.0–36.0)
MCV: 91.9 fl (ref 78.0–100.0)
Monocytes Absolute: 0.2 10*3/uL (ref 0.1–1.0)
Monocytes Relative: 5.5 % (ref 3.0–12.0)
Neutro Abs: 1.7 10*3/uL (ref 1.4–7.7)
Neutrophils Relative %: 37.4 % — ABNORMAL LOW (ref 43.0–77.0)
Platelets: 314 10*3/uL (ref 150.0–400.0)
RBC: 4.25 Mil/uL (ref 3.87–5.11)
RDW: 13.4 % (ref 11.5–15.5)
WBC: 4.5 10*3/uL (ref 4.0–10.5)

## 2020-10-31 LAB — BASIC METABOLIC PANEL
BUN: 9 mg/dL (ref 6–23)
CO2: 31 mEq/L (ref 19–32)
Calcium: 9.6 mg/dL (ref 8.4–10.5)
Chloride: 102 mEq/L (ref 96–112)
Creatinine, Ser: 0.58 mg/dL (ref 0.40–1.20)
GFR: 103.29 mL/min (ref >60.00)
Glucose, Bld: 104 mg/dL — ABNORMAL HIGH (ref 70–99)
Potassium: 3.8 mEq/L (ref 3.5–5.1)
Sodium: 140 mEq/L (ref 135–145)

## 2020-10-31 LAB — HEPATIC FUNCTION PANEL
ALT: 21 U/L (ref 0–35)
AST: 15 U/L (ref 0–37)
Albumin: 4.5 g/dL (ref 3.5–5.2)
Alkaline Phosphatase: 53 U/L (ref 39–117)
Bilirubin, Direct: 0.2 mg/dL (ref 0.0–0.3)
Total Bilirubin: 0.7 mg/dL (ref 0.2–1.2)
Total Protein: 7.3 g/dL (ref 6.0–8.3)

## 2020-10-31 LAB — HEMOGLOBIN A1C: Hgb A1c MFr Bld: 7 % — ABNORMAL HIGH (ref 4.6–6.5)

## 2020-10-31 LAB — TSH: TSH: 2.19 u[IU]/mL (ref 0.35–4.50)

## 2020-10-31 NOTE — Progress Notes (Signed)
   Subjective:    Patient ID: Sonia Little, female    DOB: 1966/04/16, 54 y.o.   MRN: [id number]  HPI Hyperlipidemia- chronic problem, on Lipitor 40mg  daily.  No abd pain, N/V  DM- chronic problem, last A1C 6.8  UTD on foot exam, on ACE for renal protection.  Due for eye exam- going this afternoon.  On Metformin XR 500mg  BID  No CP, SOB, HAs, visual changes, numbness/tingling of hands/feet.  Denies symptomatic lows  Obesity- pt's BMI is 32.15.  Exercise has been limited b/c she is husband's caregiver (pancreatic cancer) and mom's caregiver.   Review of Systems For ROS see HPI   This visit occurred during the SARS-CoV-2 public health emergency.  Safety protocols were in place, including screening questions prior to the visit, additional usage of staff PPE, and extensive cleaning of exam room while observing appropriate contact time as indicated for disinfecting solutions.       Objective:   Physical Exam Vitals reviewed.  Constitutional:      General: She is not in acute distress.    Appearance: Normal appearance. She is well-developed and well-nourished.  HENT:     Head: Normocephalic and atraumatic.  Eyes:     Extraocular Movements: EOM normal.     Conjunctiva/sclera: Conjunctivae normal.     Pupils: Pupils are equal, round, and reactive to light.  Neck:     Thyroid: No thyromegaly.  Cardiovascular:     Rate and Rhythm: Normal rate and regular rhythm.     Pulses: Intact distal pulses.     Heart sounds: Normal heart sounds. No murmur heard.   Pulmonary:     Effort: Pulmonary effort is normal. No respiratory distress.     Breath sounds: Normal breath sounds.  Abdominal:     General: There is no distension.     Palpations: Abdomen is soft.     Tenderness: There is no abdominal tenderness.  Musculoskeletal:        General: No edema.     Cervical back: Normal range of motion and neck supple.  Lymphadenopathy:     Cervical: No cervical adenopathy.  Skin:     General: Skin is warm and dry.  Neurological:     Mental Status: She is alert and oriented to person, place, and time.  Psychiatric:        Mood and Affect: Mood and affect normal.        Behavior: Behavior normal.           Assessment & Plan:

## 2020-10-31 NOTE — Assessment & Plan Note (Signed)
Ongoing issue for pt.  BMI is 32.15  Stressed need for healthy diet and regular exercise.  Will continue to follow.

## 2020-10-31 NOTE — Patient Instructions (Signed)
Schedule a lab visit at your convenience Schedule your complete physical in 6 months We'll notify you of your lab results and make any changes if needed ADD OTC CoQ10 to help w/ muscle and joint pain Continue to work on healthy diet and regular exercise- you look great! Call with any questions or concerns Stay Safe!  Stay Healthy! Happy Holidays!!! ENJOY [place]!!!

## 2020-10-31 NOTE — Assessment & Plan Note (Signed)
Chronic problem.  On Lipitor 40mg daily w/o difficulty.  Check labs.  Adjust meds prn  ?

## 2020-10-31 NOTE — Assessment & Plan Note (Signed)
Chronic problem, on Metformin XR 500mg  BID.  On ACE for renal protection.  Eye exam is this afternoon.  UTD on foot exam.  Check labs.  Adjust meds prn

## 2020-11-06 ENCOUNTER — Ambulatory Visit
Admission: RE | Admit: 2020-11-06 | Discharge: 2020-11-06 | Disposition: A | Discharge status: Home / Self Care | Payer: Commercial Managed Care - PPO | Source: Ambulatory Visit | Attending: Family Medicine | Admitting: Family Medicine

## 2020-11-06 ENCOUNTER — Other Ambulatory Visit: Payer: Self-pay

## 2020-11-06 DIAGNOSIS — Z1231 Encounter for screening mammogram for malignant neoplasm of breast: Secondary | ICD-10-CM

## 2020-12-20 ENCOUNTER — Other Ambulatory Visit: Payer: Self-pay | Admitting: Family Medicine

## 2021-03-21 ENCOUNTER — Other Ambulatory Visit: Payer: Self-pay | Admitting: Family Medicine

## 2021-05-01 ENCOUNTER — Ambulatory Visit (INDEPENDENT_AMBULATORY_CARE_PROVIDER_SITE_OTHER): Payer: Commercial Managed Care - PPO | Admitting: Family Medicine

## 2021-05-01 ENCOUNTER — Other Ambulatory Visit: Payer: Self-pay

## 2021-05-01 ENCOUNTER — Encounter: Payer: Self-pay | Admitting: Family Medicine

## 2021-05-01 VITALS — BP 125/80 | HR 76 | Temp 96.7°F | Resp 19 | Ht 60.0 in | Wt 157.4 lb

## 2021-05-01 DIAGNOSIS — E1169 Type 2 diabetes mellitus with other specified complication: Secondary | ICD-10-CM

## 2021-05-01 DIAGNOSIS — E785 Hyperlipidemia, unspecified: Secondary | ICD-10-CM

## 2021-05-01 DIAGNOSIS — M25512 Pain in left shoulder: Secondary | ICD-10-CM

## 2021-05-01 DIAGNOSIS — Z0000 Encounter for general adult medical examination without abnormal findings: Secondary | ICD-10-CM

## 2021-05-01 DIAGNOSIS — Z0184 Encounter for antibody response examination: Secondary | ICD-10-CM

## 2021-05-01 DIAGNOSIS — G8929 Other chronic pain: Secondary | ICD-10-CM

## 2021-05-01 LAB — HEPATIC FUNCTION PANEL
ALT: 26 U/L (ref 0–35)
AST: 14 U/L (ref 0–37)
Albumin: 4.8 g/dL (ref 3.5–5.2)
Alkaline Phosphatase: 56 U/L (ref 39–117)
Bilirubin, Direct: 0.1 mg/dL (ref 0.0–0.3)
Total Bilirubin: 0.9 mg/dL (ref 0.2–1.2)
Total Protein: 7.3 g/dL (ref 6.0–8.3)

## 2021-05-01 LAB — CBC WITH DIFFERENTIAL/PLATELET
Basophils Absolute: 0 10*3/uL (ref 0.0–0.1)
Basophils Relative: 0.2 % (ref 0.0–3.0)
Eosinophils Absolute: 0 10*3/uL (ref 0.0–0.7)
Eosinophils Relative: 0.5 % (ref 0.0–5.0)
HCT: 39.8 % (ref 36.0–46.0)
Hemoglobin: 13.5 g/dL (ref 12.0–15.0)
Lymphocytes Relative: 54.6 % — ABNORMAL HIGH (ref 12.0–46.0)
Lymphs Abs: 2.2 10*3/uL (ref 0.7–4.0)
MCHC: 33.8 g/dL (ref 30.0–36.0)
MCV: 93.1 fl (ref 78.0–100.0)
Monocytes Absolute: 0.2 10*3/uL (ref 0.1–1.0)
Monocytes Relative: 5.4 % (ref 3.0–12.0)
Neutro Abs: 1.6 10*3/uL (ref 1.4–7.7)
Neutrophils Relative %: 39.3 % — ABNORMAL LOW (ref 43.0–77.0)
Platelets: 325 10*3/uL (ref 150.0–400.0)
RBC: 4.28 Mil/uL (ref 3.87–5.11)
RDW: 14 % (ref 11.5–15.5)
WBC: 4.1 10*3/uL (ref 4.0–10.5)

## 2021-05-01 LAB — BASIC METABOLIC PANEL
BUN: 11 mg/dL (ref 6–23)
CO2: 29 mEq/L (ref 19–32)
Calcium: 10 mg/dL (ref 8.4–10.5)
Chloride: 101 mEq/L (ref 96–112)
Creatinine, Ser: 0.62 mg/dL (ref 0.40–1.20)
GFR: 101.29 mL/min (ref >60.00)
Glucose, Bld: 94 mg/dL (ref 70–99)
Potassium: 3.8 mEq/L (ref 3.5–5.1)
Sodium: 139 mEq/L (ref 135–145)

## 2021-05-01 LAB — LIPID PANEL
Cholesterol: 194 mg/dL (ref 0–200)
HDL: 49.5 mg/dL (ref >39.00)
LDL Cholesterol: 129 mg/dL — ABNORMAL HIGH (ref 0–99)
NonHDL: 144.09
Total CHOL/HDL Ratio: 4
Triglycerides: 73 mg/dL (ref 0.0–149.0)
VLDL: 14.6 mg/dL (ref 0.0–40.0)

## 2021-05-01 LAB — HEMOGLOBIN A1C: Hgb A1c MFr Bld: 6.6 % — ABNORMAL HIGH (ref 4.6–6.5)

## 2021-05-01 LAB — TSH: TSH: 2.46 u[IU]/mL (ref 0.35–4.50)

## 2021-05-01 NOTE — Patient Instructions (Addendum)
Follow up in 6 months to recheck diabetes, cholesterol We'll notify you of your lab results and make any changes if needed Keep up the good work on healthy diet and regular exercise- you're doing great!!! We'll call you with your Orthopedic appt Call with any questions or concerns Stay Safe!  Stay Healthy! Have a great trip!!!

## 2021-05-01 NOTE — Assessment & Plan Note (Signed)
Pt's PE WNL w/ exception of previously noted thyromegaly.  UTD on mammo, colonoscopy, immunizations.  Check labs.  Anticipatory guidance provided.

## 2021-05-01 NOTE — Progress Notes (Signed)
Subjective:    Patient ID: Sonia Little, female    DOB: [birth year], 55 y.o.   MRN: [id number]  HPI CPE- UTD on colonoscopy, mammo.  Due for foot exam.  Reviewed past medical, surgical, family and social histories.   Patient Care Team    Relationship Specialty Notifications Start End  Midge Minium, MD PCP - General Family Medicine  09/15/14   Delila Pereyra, MD Consulting Physician Gynecology  01/16/15   Francee Piccolo, MD Consulting Physician Podiatry  06/18/15     Health Maintenance  Topic Date Due   Zoster Vaccines- Shingrix (1 of 2) Never done   OPHTHALMOLOGY EXAM  10/09/2020   COVID-19 Vaccine (4 - Booster for Pfizer series) 12/03/2020   HEMOGLOBIN A1C  05/01/2021   PNEUMOCOCCAL POLYSACCHARIDE VACCINE AGE 23-64 HIGH RISK  05/02/2021 (Originally 05/18/1969)   Hepatitis C Screening  05/02/2021 (Originally 05/18/1985)   HIV Screening  05/01/2022 (Originally 05/18/1982)   FOOT EXAM  05/02/2021   INFLUENZA VACCINE  06/17/2021   MAMMOGRAM  11/06/2021   COLONOSCOPY (Pts 45-54yrs Insurance coverage will need to be confirmed)  11/10/2027   TETANUS/TDAP  08/14/2029   Pneumococcal Vaccine 23-15 Years old  Aged Out   HPV VACCINES  Aged Out      Review of Systems Patient reports no vision/ hearing changes, adenopathy,fever, persistent/recurrent hoarseness , swallowing issues, chest pain, edema, persistant/recurrent cough, hemoptysis, dyspnea (rest/exertional/paroxysmal nocturnal), gastrointestinal bleeding (melena, rectal bleeding), abdominal pain, significant heartburn, bowel changes, GU symptoms (dysuria, hematuria, incontinence), Gyn symptoms (abnormal  bleeding, pain),  syncope, focal weakness, memory loss, numbness & tingling, skin/hair/nail changes, abnormal bruising or bleeding, anxiety, or depression.   + 8 lb weight loss + L shoulder pain + rare palpitations  This visit occurred during the SARS-CoV-2 public health emergency.  Safety protocols were in place, including  screening questions prior to the visit, additional usage of staff PPE, and extensive cleaning of exam room while observing appropriate contact time as indicated for disinfecting solutions.      Objective:   Physical Exam General Appearance:    Alert, cooperative, no distress, appears stated age  Head:    Normocephalic, without obvious abnormality, atraumatic  Eyes:    PERRL, conjunctiva/corneas clear, EOM's intact, fundi    benign, both eyes  Ears:    Normal TM's and external ear canals, both ears  Nose:   Nares normal, septum midline, mucosa normal, no drainage    or sinus tenderness  Throat:   Lips, mucosa, and tongue normal; teeth and gums normal  Neck:   Supple, symmetrical, trachea midline, no adenopathy;    Thyroid: diffuse enlargement  Back:     Symmetric, no curvature, ROM normal, no CVA tenderness  Lungs:     Clear to auscultation bilaterally, respirations unlabored  Chest Wall:    No tenderness or deformity   Heart:    Regular rate and rhythm, S1 and S2 normal, no murmur, rub   or gallop  Breast Exam:    Deferred to mammo  Abdomen:     Soft, non-tender, bowel sounds active all four quadrants,    no masses, no organomegaly  Genitalia:    Deferred to GYN  Rectal:    Extremities:   Extremities normal, atraumatic, no cyanosis or edema  Pulses:   2+ and symmetric all extremities  Skin:   Skin color, texture, turgor normal, no rashes or lesions  Lymph nodes:   Cervical, supraclavicular, and axillary nodes normal  Neurologic:   CNII-XII intact,  normal strength, sensation and reflexes    throughout          Assessment & Plan:

## 2021-05-01 NOTE — Assessment & Plan Note (Signed)
Chronic problem.  UTD on eye exam.  Foot exam done today.  On ACE for renal protection.  Check labs and determine if adjustments are needed.

## 2021-05-02 ENCOUNTER — Encounter: Payer: Self-pay | Admitting: Family Medicine

## 2021-05-02 LAB — VARICELLA ZOSTER ANTIBODY, IGG: Varicella IgG: <135 index — ABNORMAL LOW

## 2021-05-06 ENCOUNTER — Encounter: Payer: Self-pay | Admitting: Physician Assistant

## 2021-05-06 ENCOUNTER — Other Ambulatory Visit: Payer: Self-pay

## 2021-05-06 ENCOUNTER — Ambulatory Visit: Payer: Commercial Managed Care - PPO | Admitting: Physician Assistant

## 2021-05-06 ENCOUNTER — Ambulatory Visit: Payer: Self-pay

## 2021-05-06 ENCOUNTER — Ambulatory Visit: Payer: Commercial Managed Care - PPO

## 2021-05-06 DIAGNOSIS — M542 Cervicalgia: Secondary | ICD-10-CM

## 2021-05-06 DIAGNOSIS — M25512 Pain in left shoulder: Secondary | ICD-10-CM | POA: Diagnosis not present

## 2021-05-06 DIAGNOSIS — G8929 Other chronic pain: Secondary | ICD-10-CM

## 2021-05-06 MED ORDER — METHYLPREDNISOLONE ACETATE 40 MG/ML IJ SUSP
40.0000 mg | INTRAMUSCULAR | Status: AC | PRN
  Administered 2021-05-06: 40 mg via INTRA_ARTICULAR

## 2021-05-06 MED ORDER — LIDOCAINE HCL 1 % IJ SOLN
3.0000 mL | INTRAMUSCULAR | Status: AC | PRN
  Administered 2021-05-06: 3 mL

## 2021-05-06 NOTE — Progress Notes (Signed)
Office Visit Note   Patient: Sonia Little           Date of Birth: May 03, 1966           MRN: [id number] Visit Date: 05/06/2021              Requested by: Midge Minium, MD [address] [place],  [place] [address] PCP: Midge Minium, MD   Assessment & Plan: Visit Diagnoses:  1. Chronic left shoulder pain   2. Neck pain     Plan: She shown some shoulder exercises.  She will monitor her glucose levels closely over the next couple of days.  We will see her back in 2 weeks to see what type of response she had to the injection.  Questions were encouraged and answered at length.  If she is doing well in 2 weeks she can call and cancel the appointment.  Follow-Up Instructions: Return in about 2 weeks (around 05/20/2021).   Orders:  Orders Placed This Encounter  Procedures   Large Joint Inj   XR Shoulder Left   XR Cervical Spine 2 or 3 views   No orders of the defined types were placed in this encounter.     Procedures: Large Joint Inj: L subacromial bursa on 05/06/2021 12:23 PM Indications: pain Details: 22 G 1.5 in needle, lateral approach  Arthrogram: No  Medications: 3 mL lidocaine 1 %; 40 mg methylPREDNISolone acetate 40 MG/ML Outcome: tolerated well, no immediate complications Procedure, treatment alternatives, risks and benefits explained, specific risks discussed. Consent was given by the patient. Immediately prior to procedure a time out was called to verify the correct patient, procedure, equipment, support staff and site/side marked as required. Patient was prepped and draped in the usual sterile fashion.      Clinical Data: No additional findings.   Subjective: Chief Complaint  Patient presents with   Left Shoulder - Pain   Neck - Pain    HPI Mrs. Sonia Little is a pleasant 55 year old female comes in today with left shoulder pain has been ongoing for the past 6 months.  She states she was taking care of her mother before she passed away  and did a lot of lifting on her.  Pain between her shoulder blades and about the left shoulder girdle.  She has pain with range of motion.  Pain does radiate down to the elbow no numbness tingling.  Pain does awaken her at night if she tries sleep on the left side.  She tried chiropractor and some topical creams no real relief.  Also takes Tylenol arthritis as needed.  She notes massages give her temporary relief.  She does have some neck discomfort.  She spends a good deal of time on the computer for work. Review of Systems Negative for fevers chills or recent vaccines.  Objective: Vital Signs: There were no vitals taken for this visit.  Physical Exam Constitutional:      Appearance: She is not ill-appearing or diaphoretic.  Neurological:     Mental Status: She is alert and oriented to person, place, and time.  Psychiatric:        Behavior: Behavior normal.    Ortho Exam Cervical spine excellent range of motion cervical spine without pain.  Negative Spurling's.  Nontender over the medial borders of scapula bilaterally.  Nontender of the cervical spinal column. Bilateral shoulders 5 out of 5 strength with external and internal rotation against resistance.  Empty can test is negative  bilaterally.  Liftoff test negative bilaterally.  Positive impingement testing on the left negative on the right.  Specialty Comments:  No specialty comments available.  Imaging: XR Cervical Spine 2 or 3 views  Result Date: 05/06/2021 Cervical spine 2 views: Loss of lordotic curvature.  Degenerative disc disease at C5-C6 with anterior endplate spurring.  Otherwise vertebral bodies/space well-maintained throughout the cervical spine.  No spinal listhesis.  XR Shoulder Left  Result Date: 05/06/2021 Left shoulder: 3 views: Shoulders well located.  Glenohumeral joint is well-maintained.  No acute fractures bony abnormalities.  Subacromial space well-maintained.    PMFS History: Patient Active Problem List    Diagnosis Date Noted   Allergic rhinitis due to animal (cat) (dog) hair and dander 10/31/2020   Allergic rhinitis due to pollen 10/31/2020   Chronic allergic conjunctivitis 10/31/2020   Mild intermittent asthma 10/31/2020   Obesity (BMI 30-39.9) 10/31/2020   Physical exam 01/16/2015   Hypopotassemia 03/30/2013   Diabetes mellitus type II, controlled, with no complications ([facility]) 98/92/1194   CARPAL TUNNEL SYNDROME 03/30/2008   Hyperlipidemia associated with type 2 diabetes mellitus ([facility]) 05/04/2007   EXOPHTHALMOS NOS 04/02/2007   Past Medical History:  Diagnosis Date   CTS (carpal tunnel syndrome)    Exophthalmos, unspecified    Hypertension    Other and unspecified hyperlipidemia    Type II or unspecified type diabetes mellitus without mention of complication, not stated as uncontrolled     Family History  Problem Relation Age of Onset   Hypertension Father    Diabetes Father    Heart failure Father    Diabetes Mother    Hypertension Mother    Hypertension Sister    Hypertension Brother    Diabetes Maternal Aunt    Diabetes Maternal Uncle    Colon cancer Neg Hx    Stomach cancer Neg Hx     Past Surgical History:  Procedure Laterality Date   ABDOMINAL HYSTERECTOMY  2011   For fibroids with pelvic pain    TONSILLECTOMY     WISDOM TOOTH EXTRACTION     Social History   Occupational History   Not on file  Tobacco Use   Smoking status: Never   Smokeless tobacco: Never  Vaping Use   Vaping Use: Never used  Substance and Sexual Activity   Alcohol use: No   Drug use: No   Sexual activity: Not on file

## 2021-05-15 ENCOUNTER — Encounter: Payer: Self-pay | Admitting: *Deleted

## 2021-05-21 ENCOUNTER — Ambulatory Visit (INDEPENDENT_AMBULATORY_CARE_PROVIDER_SITE_OTHER): Payer: Commercial Managed Care - PPO

## 2021-05-21 ENCOUNTER — Other Ambulatory Visit: Payer: Self-pay

## 2021-05-21 DIAGNOSIS — Z23 Encounter for immunization: Secondary | ICD-10-CM | POA: Diagnosis not present

## 2021-05-23 ENCOUNTER — Ambulatory Visit: Payer: Commercial Managed Care - PPO | Admitting: Physician Assistant

## 2021-05-23 ENCOUNTER — Other Ambulatory Visit: Payer: Self-pay

## 2021-05-23 ENCOUNTER — Encounter: Payer: Self-pay | Admitting: Physician Assistant

## 2021-05-23 DIAGNOSIS — M7542 Impingement syndrome of left shoulder: Secondary | ICD-10-CM

## 2021-05-23 NOTE — Addendum Note (Signed)
Addended by: Robyne Peers on: 05/23/2021 04:40 PM   Modules accepted: Orders

## 2021-05-23 NOTE — Progress Notes (Signed)
HPI: Sonia Little returns today for follow-up of her left shoulder.  He has shoulder pains been ongoing for the past 6 months.  She had no particular injury but was doing a lot of lifting on her mother who recently passed away.  States the injection of the left shoulder helped for about a week took away approximately 50% of her pain during that time.  Now pain is actually a little bit worse than it was prior to the injection her range of motion is diminished.  Review of systems: See HPI otherwise negative or noncontributory.  Physical exam: General well-developed well-nourished female no acute distress.  Bilateral shoulder she has 5 5 strength with external and internal rotation against resistance.  Empty can test is negative bilaterally.  Impingement testing positive on the left negative on the right.  Liftoff test positive left negative on the right.  She has full range of motion overhead with the right shoulder only able to bring her left arm above her head to 160 degrees.  Impression: Left shoulder impingement  Plan: Given patient has failed conservative treatment and affected her range of motion is diminished and she has a positive liftoff test recommend MRI to rule out rotator cuff tear.  Have her follow-up after the MRI to go over results discuss further treatment.  Questions encouraged and answered at length

## 2021-06-06 ENCOUNTER — Ambulatory Visit
Admission: RE | Admit: 2021-06-06 | Discharge: 2021-06-06 | Disposition: A | Discharge status: Home / Self Care | Payer: Commercial Managed Care - PPO | Source: Ambulatory Visit | Attending: Physician Assistant | Admitting: Physician Assistant

## 2021-06-06 ENCOUNTER — Other Ambulatory Visit: Payer: Self-pay

## 2021-06-06 DIAGNOSIS — M7542 Impingement syndrome of left shoulder: Secondary | ICD-10-CM

## 2021-06-12 ENCOUNTER — Ambulatory Visit: Payer: Commercial Managed Care - PPO | Admitting: Orthopaedic Surgery

## 2021-06-12 ENCOUNTER — Ambulatory Visit: Payer: Self-pay

## 2021-06-12 ENCOUNTER — Other Ambulatory Visit: Payer: Self-pay

## 2021-06-12 ENCOUNTER — Encounter: Payer: Self-pay | Admitting: Orthopaedic Surgery

## 2021-06-12 DIAGNOSIS — G8929 Other chronic pain: Secondary | ICD-10-CM

## 2021-06-12 DIAGNOSIS — M25512 Pain in left shoulder: Secondary | ICD-10-CM

## 2021-06-12 DIAGNOSIS — M7542 Impingement syndrome of left shoulder: Secondary | ICD-10-CM

## 2021-06-12 DIAGNOSIS — M542 Cervicalgia: Secondary | ICD-10-CM

## 2021-06-12 NOTE — Progress Notes (Signed)
The patient is a very pleasant 55 year old female who comes in today to go over a MRI of her left shoulder.  She has had worsening shoulder range of motion and pain for some time now with no known injury.  She has had at least a subacromial steroid injection of the left shoulder.  She has not been through any physical therapy recently.  On examination of her left shoulder she still has significant stiffness with forward flexion and abduction and lacks full range of motion.  The MRI of her left shoulder shows the rotator cuff is intact and there is just some mild tendinosis of the rotator cuff.  There is some thickening of the inferior glenohumeral ligament to suggest that she may be developing a frozen shoulder.  At this point I recommended sending her to Dr. Junius Roads for an ultrasound-guided intra-articular steroid injection of left shoulder joint.  This needs to be in combination with outpatient physical therapy.  We can set that up for her as well.  She agrees with this treatment plan.  All questions and concerns were answered and addressed.  I would then like to see her back myself in about 6 weeks.

## 2021-06-12 NOTE — Progress Notes (Signed)
Subjective: Patient is here for ultrasound-guided intra-articular left glenohumeral injection.  Pain with overhead and behind the back reach.  Objective: Limited active range of motion.  Procedure: Ultrasound guided injection is preferred based studies that show increased duration, increased effect, greater accuracy, decreased procedural pain, increased response rate, and decreased cost with ultrasound guided versus blind injection.   Verbal informed consent obtained.  Time-out conducted.  Noted no overlying erythema, induration, or other signs of local infection. Ultrasound-guided left glenohumeral injection: After sterile prep with Betadine, injected 4 cc 0.25% bupivocaine without epinephrine and 6 mg betamethasone using a 22-gauge spinal needle, passing the needle from posterior approach into the glenohumeral joint.  Injectate was seen filling the joint capsule.  Improvement in pain and range of motion during the anesthetic phase.

## 2021-06-16 ENCOUNTER — Other Ambulatory Visit: Payer: Self-pay | Admitting: Family Medicine

## 2021-06-24 ENCOUNTER — Ambulatory Visit (INDEPENDENT_AMBULATORY_CARE_PROVIDER_SITE_OTHER): Payer: Commercial Managed Care - PPO | Admitting: Physical Therapy

## 2021-06-24 ENCOUNTER — Other Ambulatory Visit: Payer: Self-pay

## 2021-06-24 ENCOUNTER — Encounter: Payer: Self-pay | Admitting: Physical Therapy

## 2021-06-24 DIAGNOSIS — M25512 Pain in left shoulder: Secondary | ICD-10-CM | POA: Diagnosis not present

## 2021-06-24 DIAGNOSIS — M542 Cervicalgia: Secondary | ICD-10-CM | POA: Diagnosis not present

## 2021-06-24 DIAGNOSIS — M6281 Muscle weakness (generalized): Secondary | ICD-10-CM

## 2021-06-24 DIAGNOSIS — G8929 Other chronic pain: Secondary | ICD-10-CM

## 2021-06-24 DIAGNOSIS — M25612 Stiffness of left shoulder, not elsewhere classified: Secondary | ICD-10-CM

## 2021-06-24 NOTE — Patient Instructions (Signed)
Access Code: [email or web address] URL: [email or web address] Date: 06/24/2021 Prepared by: Kearney Hard  Exercises Gentle Levator Scapulae Stretch - 2-3 x daily - 7 x weekly - 5 reps - 10 seconds hold Seated Gentle Upper Trapezius Stretch - 2-3 x daily - 7 x weekly - 5 reps - 10 seconds hold Supine Shoulder Flexion Extension AAROM with Dowel - 2-3 x daily - 7 x weekly - 2 sets - 10 reps Supine Shoulder External Rotation in 45 Degrees Abduction AAROM with Dowel - 2-3 x daily - 7 x weekly - 2 sets - 10 reps - 3-5 seconds hold Standing Shoulder Row with Anchored Resistance - 2-3 x daily - 7 x weekly - 2 sets - 10 reps Corner Pec Major Stretch - 2-3 x daily - 7 x weekly - 5 reps - 10 seconds hold

## 2021-06-24 NOTE — Therapy (Signed)
[facility] Physical Therapy [address] [place], [place], [address] Phone: [phone]   Fax:  [phone]  Physical Therapy Evaluation  Patient Details  Name: Teruko Deshonda Bown MRN: [id number] Date of Birth: August 08, 1966 Referring Provider (PT): Jean Rosenthal, MD   Encounter Date: 06/24/2021   PT End of Session - 06/24/21 0853     Visit Number 1    Number of Visits 12    Date for PT Re-Evaluation 08/09/21    Authorization Type UHC    PT Start Time 0845    PT Stop Time 0930    PT Time Calculation (min) 45 min    Activity Tolerance Patient tolerated treatment well    Behavior During Therapy WFL for tasks assessed/performed             Past Medical History:  Diagnosis Date   CTS (carpal tunnel syndrome)    Exophthalmos, unspecified    Hypertension    Other and unspecified hyperlipidemia    Type II or unspecified type diabetes mellitus without mention of complication, not stated as uncontrolled     Past Surgical History:  Procedure Laterality Date   ABDOMINAL HYSTERECTOMY  2011   For fibroids with pelvic pain    TONSILLECTOMY     WISDOM TOOTH EXTRACTION      There were no vitals filed for this visit.    Subjective Assessment - 06/24/21 0850     Subjective Pt arriving for PT evaluation with left shoulder pain. Pt beleives it all began around January 2022. Pt stating it hurts but she manages or compensates with ADL's or ask for help when needed for household chores. Pt s/p left shoulder injection with some pain relief.    Pertinent History CTS, HTN, DM2, abdominal hysterectomy,    Diagnostic tests X-ray    Patient Stated Goals Move better    Currently in Pain? Yes    Pain Score 5     Pain Location Shoulder    Pain Orientation Left    Pain Descriptors / Indicators Sharp    Pain Type Chronic pain    Pain Onset More than a month ago    Pain Frequency Intermittent    Aggravating Factors  certain movements    Pain Relieving Factors  resting, over the counter pain meds    Effect of Pain on Daily Activities difficulty with some households depending on movements, overhead reaching                [facility] PT Assessment - 06/24/21 0001       Assessment   Medical Diagnosis m75.42, impingement syndrome left shoulder, M25.512 chronic left shoulder pain, M54.2 neck pain    Referring Provider (PT) Jean Rosenthal, MD    Hand Dominance Right    Prior Therapy yes, years ago for left shoulder      Precautions   Precautions None      Restrictions   Weight Bearing Restrictions No      Balance Screen   Has the patient fallen in the past 6 months No    Is the patient reluctant to leave their home because of a fear of falling?  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    Leisure read, ride bicycle      Cognition   Overall Cognitive Status Within Functional Limits for tasks assessed      Observation/Other Assessments   Focus on Therapeutic Outcomes (FOTO)  60 (predicted 63)      ROM / Strength   AROM / PROM / Strength AROM;Strength;PROM      AROM   Overall AROM Comments supine measurements    AROM Assessment Site Shoulder;Cervical    Right/Left Shoulder Right;Left    Right Shoulder Extension 45 Degrees    Right Shoulder Flexion 160 Degrees    Right Shoulder ABduction 165 Degrees    Right Shoulder Internal Rotation 55 Degrees   supine shoulder abd 45 degree   Right Shoulder External Rotation 70 Degrees   supine, shoulder abd 45 degrees   Left Shoulder Extension 40 Degrees    Left Shoulder Flexion 120 Degrees    Left Shoulder ABduction  125 Degrees    Left Shoulder Internal Rotation 55 Degrees   supine, shoulder abd 45 degrees   Left Shoulder External Rotation 30 Degrees   supine shoulder abd 45 degrees   Cervical Flexion 30    Cervical Extension 25    Cervical - Right Side Bend 20    Cervical - Left Side Bend 26    Cervical - Right Rotation 65    Cervical - Left Rotation 65      PROM   PROM Assessment Site Shoulder    Right/Left Shoulder Left    Left Shoulder Extension 44 Degrees    Left Shoulder Flexion 125 Degrees    Left Shoulder ABduction 130 Degrees    Left Shoulder Internal Rotation 58 Degrees    Left Shoulder External Rotation 34 Degrees      Strength   Strength Assessment Site Shoulder    Right/Left Shoulder Right;Left    Right Shoulder Flexion 5/5    Right Shoulder Extension 5/5    Right Shoulder ABduction 5/5    Right Shoulder Internal Rotation 5/5    Right Shoulder External Rotation 5/5    Left Shoulder Flexion 4/5    Left Shoulder Extension 4/5    Left Shoulder ABduction 4/5    Left Shoulder Internal Rotation 4/5    Left Shoulder External Rotation 4/5      Palpation   Palpation comment TTP: posterior capsule      Special Tests   Other special tests + empty can on left      Transfers   Five time sit to stand comments  11 seconds no UE support      Ambulation/Gait   Gait Comments decreased arm swing on left compared to right                        Objective measurements completed on examination: See above findings.       [facility] Adult PT Treatment/Exercise - 06/24/21 0001       Exercises   Exercises Shoulder      Shoulder Exercises: Supine   External Rotation AAROM;Left;10 reps    External Rotation Weight (lbs) 1# bar    Flexion AAROM;10 reps    Shoulder Flexion Weight (lbs) 1# bar      Shoulder Exercises: Standing   Row 10 reps;Strengthening      Shoulder Exercises: Stretch  Corner Stretch 2 reps;10 seconds    Other Shoulder Stretches levator stretch x 3  holding 10 seconds    Other Shoulder Stretches upper trap stretch x 5 to each side holding 10 seconds                    PT Education - 06/24/21 0853     Education Details PT POC, HEP    Person(s) Educated Patient    Methods Explanation;Demonstration;Tactile cues;Verbal cues;Handout    Comprehension Returned demonstration;Verbalized understanding              PT Short Term Goals - 06/24/21 1028       PT SHORT TERM GOAL #1   Title Pt will be independent in initial HEP.    Time 3    Period Weeks    Status New    Target Date 07/19/21      PT SHORT TERM GOAL #2   Title Pt will report less pain in her cervical spine and left upper trap when sitting with work to </= 4/10.    Time 4    Period Weeks    Status New    Target Date 07/26/21               PT Long Term Goals - 06/24/21 1042       PT LONG TERM GOAL #1   Title Pt will be independent in her advanced HEP.    Time 8    Period Weeks    Status New    Target Date 08/23/21      PT LONG TERM GOAL #2   Title Pt will improve her left shoulder flexion to >/= 150 degrees.    Time 8    Status New    Target Date 08/23/21      PT LONG TERM GOAL #3   Title Pt will improve her left shoulder ER to >/= 60 degrees with pain </= 3/10 in order to fix her hair.    Time 8    Period Weeks    Status New    Target Date 08/23/21      PT LONG TERM GOAL #4   Title pt will improve her FOTO score to >/= 63%.    Time 8    Period Weeks    Target Date 08/23/21      PT LONG TERM GOAL #5   Title Pt will be able to lift 10# from counter to overhead shelf using bilateral UE"s with no pain reported.    Time 8    Period Weeks    Status New    Target Date 08/23/21                    Plan - 06/24/21 [id number]     Clinical Impression Statement Pt arriving to therapy reporting 6 month history of left shoulder pain and neck pain. Pt reporting pain today of 5/10 in left shoulder. Pt dx with left shoulder impingment,  presenting with positive Empty Can test today. Pt with decreased active and passive range of motion and decreased strength. Pt was issued a HEP and able to return demonstration. Skilled PT needed to address pt's impairments with the below interventions.    Personal Factors and Comorbidities Comorbidity 3+    Comorbidities CTS, HTN, DM2, abdominal hysterectomy    Examination-Activity Limitations Lift;Carry;Reach Overhead    Examination-Participation Restrictions Community Activity;Other;Laundry    Stability/Clinical Decision Making Stable/Uncomplicated    Clinical Decision  Making Low    Rehab Potential Good    PT Frequency 1x / week    PT Duration 8 weeks   limited by pt's $60 co-pay each visit   PT Treatment/Interventions ADLs/Self Care Home Management;Cryotherapy;Electrical Stimulation;Iontophoresis '4mg'$ /ml Dexamethasone;Moist Heat;Traction;Ultrasound;Therapeutic exercise;Therapeutic activities;Functional mobility training;Gait training;Neuromuscular re-education;Patient/family education;Dry needling;Taping;Passive range of motion;Manual techniques;Joint Manipulations;Spinal Manipulations    PT Next Visit Plan cervial stretching, shoulder ROM, shoulder strengthening, pulleys, UBE    PT Home Exercise Plan Access Code: [email or web address]  URL: [email or web address]  Date: 06/24/2021  Prepared by: Kearney Hard    Exercises  Gentle Levator Scapulae Stretch - 2-3 x daily - 7 x weekly - 5 reps - 10 seconds hold  Seated Gentle Upper Trapezius Stretch - 2-3 x daily - 7 x weekly - 5 reps - 10 seconds hold  Supine Shoulder Flexion Extension AAROM with Dowel - 2-3 x daily - 7 x weekly - 2 sets - 10 reps  Supine Shoulder External Rotation in 45 Degrees Abduction AAROM with Dowel - 2-3 x daily - 7 x weekly - 2 sets - 10 reps - 3-5 seconds hold  Standing Shoulder Row with Anchored Resistance - 2-3 x daily - 7 x weekly - 2 sets - 10 reps  Corner Pec Major Stretch - 2-3 x daily - 7 x weekly - 5 reps - 10 seconds  hold    Consulted and Agree with Plan of Care Patient             Patient will benefit from skilled therapeutic intervention in order to improve the following deficits and impairments:  Pain, Decreased strength, Postural dysfunction, Impaired flexibility, Decreased activity tolerance, Decreased range of motion, Impaired UE functional use  Visit Diagnosis: Chronic left shoulder pain  Stiffness of left shoulder, not elsewhere classified  Cervicalgia  Muscle weakness (generalized)     Problem List Patient Active Problem List   Diagnosis Date Noted   Allergic rhinitis due to animal (cat) (dog) hair and dander 10/31/2020   Allergic rhinitis due to pollen 10/31/2020   Chronic allergic conjunctivitis 10/31/2020   Mild intermittent asthma 10/31/2020   Obesity (BMI 30-39.9) 10/31/2020   Physical exam 01/16/2015   Hypopotassemia 03/30/2013   Diabetes mellitus type II, controlled, with no complications ([place]) [id number]   CARPAL TUNNEL SYNDROME 03/30/2008   Hyperlipidemia associated with type 2 diabetes mellitus ([place]) 05/04/2007   EXOPHTHALMOS NOS 04/02/2007    Oretha Caprice, PT, MPT 06/24/2021, 10:46 AM  [facility] Physical Therapy [address] [place], [place], [address] Phone: [phone]   Fax:  [phone]  Name: Mercy Norrene Whalley MRN: [id number] Date of Birth: [birth year]

## 2021-07-05 ENCOUNTER — Other Ambulatory Visit: Payer: Self-pay

## 2021-07-05 ENCOUNTER — Ambulatory Visit (INDEPENDENT_AMBULATORY_CARE_PROVIDER_SITE_OTHER): Payer: Commercial Managed Care - PPO | Admitting: Physical Therapy

## 2021-07-05 ENCOUNTER — Encounter: Payer: Self-pay | Admitting: Physical Therapy

## 2021-07-05 DIAGNOSIS — M25612 Stiffness of left shoulder, not elsewhere classified: Secondary | ICD-10-CM

## 2021-07-05 DIAGNOSIS — G8929 Other chronic pain: Secondary | ICD-10-CM

## 2021-07-05 DIAGNOSIS — M25512 Pain in left shoulder: Secondary | ICD-10-CM

## 2021-07-05 DIAGNOSIS — M542 Cervicalgia: Secondary | ICD-10-CM

## 2021-07-05 DIAGNOSIS — M6281 Muscle weakness (generalized): Secondary | ICD-10-CM | POA: Diagnosis not present

## 2021-07-05 NOTE — Therapy (Signed)
[facility] Physical Therapy [address] [place], [place], [address] Phone: [phone]   Fax:  [phone]  Physical Therapy Treatment  Patient Details  Name: Sonia Little MRN: [id number] Date of Birth: [birth year] Referring Provider (PT): Jean Rosenthal, MD   Encounter Date: 07/05/2021   PT End of Session - 07/05/21 0806     Visit Number 2    Number of Visits 12    Date for PT Re-Evaluation 08/09/21    Authorization Type UHC    PT Start Time 0801    PT Stop Time 0841    PT Time Calculation (min) 40 min    Activity Tolerance Patient tolerated treatment well    Behavior During Therapy WFL for tasks assessed/performed             Past Medical History:  Diagnosis Date   CTS (carpal tunnel syndrome)    Exophthalmos, unspecified    Hypertension    Other and unspecified hyperlipidemia    Type II or unspecified type diabetes mellitus without mention of complication, not stated as uncontrolled     Past Surgical History:  Procedure Laterality Date   ABDOMINAL HYSTERECTOMY  2011   For fibroids with pelvic pain    TONSILLECTOMY     WISDOM TOOTH EXTRACTION      There were no vitals filed for this visit.   Subjective Assessment - 07/05/21 0804     Subjective shoulder is a little stiff; exercises do seem to be helping    Pertinent History CTS, HTN, DM2, abdominal hysterectomy,    Diagnostic tests X-ray    Patient Stated Goals Move better    Currently in Pain? No/denies                               [facility] Adult PT Treatment/Exercise - 07/05/21 0806       Shoulder Exercises: Supine   External Rotation AAROM;Left;10 reps    External Rotation Weight (lbs) 1# bar    Flexion AAROM;15 reps    Shoulder Flexion Weight (lbs) 1# bar      Shoulder Exercises: Standing   Row Both;15 reps;Theraband    Theraband Level (Shoulder Row) Level 3 (Green)      Shoulder Exercises: Pulleys   Flexion 3 minutes    Scaption 3  minutes      Shoulder Exercises: Stretch   Corner Stretch 4 reps;10 seconds    Other Shoulder Stretches levator stretch x 3 holding 10 seconds; bil    Other Shoulder Stretches upper trap stretch x 5 to each side holding 10 seconds      Manual Therapy   Manual Therapy Soft tissue mobilization;Passive ROM;Joint mobilization    Joint Mobilization Lt shoulder A/P and inf mobs grades 2-3    Soft tissue mobilization Lt shoulder infraspinatus and lateral subscap    Passive ROM Lt shoulder flexion/abdct/er to tolerance                      PT Short Term Goals - 07/05/21 0849       PT SHORT TERM GOAL #1   Title Pt will be independent in initial HEP.    Time 3    Period Weeks    Status Achieved    Target Date 07/19/21      PT SHORT TERM GOAL #2   Title Pt will report less pain in her cervical spine and left upper trap when sitting  with work to </= 4/10.    Time 4    Period Weeks    Status On-going    Target Date 07/26/21               PT Long Term Goals - 06/24/21 1042       PT LONG TERM GOAL #1   Title Pt will be independent in her advanced HEP.    Time 8    Period Weeks    Status New    Target Date 08/23/21      PT LONG TERM GOAL #2   Title Pt will improve her left shoulder flexion to >/= 150 degrees.    Time 8    Status New    Target Date 08/23/21      PT LONG TERM GOAL #3   Title Pt will improve her left shoulder ER to >/= 60 degrees with pain </= 3/10 in order to fix her hair.    Time 8    Period Weeks    Status New    Target Date 08/23/21      PT LONG TERM GOAL #4   Title pt will improve her FOTO score to >/= 63%.    Time 8    Period Weeks    Target Date 08/23/21      PT LONG TERM GOAL #5   Title Pt will be able to lift 10# from counter to overhead shelf using bilateral UE"s with no pain reported.    Time 8    Period Weeks    Status New    Target Date 08/23/21                   Plan - 07/05/21 0849     Clinical Impression  Statement Pt is independent with initial HEP meeting STG #1 at this time.  Overall pain reports are improved and at this time biggest c/o is tightness.  May benefit from DN to subscap (lateral border) and infraspinatus, with information provided today.  Will continue to benefit from PT to maximize function.    Personal Factors and Comorbidities Comorbidity 3+    Comorbidities CTS, HTN, DM2, abdominal hysterectomy    Examination-Activity Limitations Lift;Carry;Reach Overhead    Examination-Participation Restrictions Community Activity;Other;Laundry    Stability/Clinical Decision Making Stable/Uncomplicated    Rehab Potential Good    PT Frequency 1x / week    PT Duration 8 weeks   limited by pt's $60 co-pay each visit   PT Treatment/Interventions ADLs/Self Care Home Management;Cryotherapy;Electrical Stimulation;Iontophoresis '4mg'$ /ml Dexamethasone;Moist Heat;Traction;Ultrasound;Therapeutic exercise;Therapeutic activities;Functional mobility training;Gait training;Neuromuscular re-education;Patient/family education;Dry needling;Taping;Passive range of motion;Manual techniques;Joint Manipulations;Spinal Manipulations    PT Next Visit Plan cervial stretching, shoulder ROM, shoulder strengthening, pulleys, UBE, consider DN to RTC    PT Home Exercise Plan Access Code: [email or web address]  URL: [email or web address]  Date: 06/24/2021  Prepared by: Kearney Hard    Exercises  Gentle Levator Scapulae Stretch - 2-3 x daily - 7 x weekly - 5 reps - 10 seconds hold  Seated Gentle Upper Trapezius Stretch - 2-3 x daily - 7 x weekly - 5 reps - 10 seconds hold  Supine Shoulder Flexion Extension AAROM with Dowel - 2-3 x daily - 7 x weekly - 2 sets - 10 reps  Supine Shoulder External Rotation in 45 Degrees Abduction AAROM with Dowel - 2-3 x daily - 7 x weekly - 2 sets - 10 reps - 3-5 seconds hold  Standing Shoulder Row with Anchored Resistance - 2-3 x daily -  7 x weekly - 2 sets - 10 reps  Corner Pec Major Stretch - 2-3 x  daily - 7 x weekly - 5 reps - 10 seconds hold    Consulted and Agree with Plan of Care Patient             Patient will benefit from skilled therapeutic intervention in order to improve the following deficits and impairments:  Pain, Decreased strength, Postural dysfunction, Impaired flexibility, Decreased activity tolerance, Decreased range of motion, Impaired UE functional use  Visit Diagnosis: Chronic left shoulder pain  Stiffness of left shoulder, not elsewhere classified  Cervicalgia  Muscle weakness (generalized)     Problem List Patient Active Problem List   Diagnosis Date Noted   Allergic rhinitis due to animal (cat) (dog) hair and dander 10/31/2020   Allergic rhinitis due to pollen 10/31/2020   Chronic allergic conjunctivitis 10/31/2020   Mild intermittent asthma 10/31/2020   Obesity (BMI 30-39.9) 10/31/2020   Physical exam 01/16/2015   Hypopotassemia 03/30/2013   Diabetes mellitus type II, controlled, with no complications ([place]) [id number]   CARPAL TUNNEL SYNDROME 03/30/2008   Hyperlipidemia associated with type 2 diabetes mellitus ([place]) 05/04/2007   EXOPHTHALMOS NOS 04/02/2007      Laureen Abrahams, PT, DPT 07/05/21 8:52 AM     [place] [facility] Physical Therapy [address] [place], [place], [address] Phone: [phone]   Fax:  [phone]  Name: Sonia Little MRN: [id number] Date of Birth: [birth year]

## 2021-07-10 ENCOUNTER — Other Ambulatory Visit: Payer: Self-pay

## 2021-07-10 ENCOUNTER — Encounter: Payer: Self-pay | Admitting: Physical Therapy

## 2021-07-10 ENCOUNTER — Ambulatory Visit (INDEPENDENT_AMBULATORY_CARE_PROVIDER_SITE_OTHER): Payer: Commercial Managed Care - PPO | Admitting: Physical Therapy

## 2021-07-10 DIAGNOSIS — M6281 Muscle weakness (generalized): Secondary | ICD-10-CM

## 2021-07-10 DIAGNOSIS — M25512 Pain in left shoulder: Secondary | ICD-10-CM

## 2021-07-10 DIAGNOSIS — M25612 Stiffness of left shoulder, not elsewhere classified: Secondary | ICD-10-CM | POA: Diagnosis not present

## 2021-07-10 DIAGNOSIS — G8929 Other chronic pain: Secondary | ICD-10-CM

## 2021-07-10 DIAGNOSIS — M542 Cervicalgia: Secondary | ICD-10-CM | POA: Diagnosis not present

## 2021-07-10 NOTE — Therapy (Signed)
[facility] Therapy [address] [place], [place], [address] Phone: [phone]   Fax:  [phone]  Physical Therapy Treatment  Patient Details  Name: Sonia Little MRN: [id number] Date of Birth: 06/30/1966 Referring Provider (PT): Jean Rosenthal, MD   Encounter Date: 07/10/2021   PT End of Session - 07/10/21 0847     Visit Number 3    Number of Visits 12    Date for PT Re-Evaluation 08/09/21    Authorization Type UHC    PT Start Time [id number]    PT Stop Time 0927    PT Time Calculation (min) 40 min    Activity Tolerance Patient tolerated treatment well    Behavior During Therapy [facility] for tasks assessed/performed             Past Medical History:  Diagnosis Date   CTS (carpal tunnel syndrome)    Exophthalmos, unspecified    Hypertension    Other and unspecified hyperlipidemia    Type II or unspecified type diabetes mellitus without mention of complication, not stated as uncontrolled     Past Surgical History:  Procedure Laterality Date   ABDOMINAL HYSTERECTOMY  2011   For fibroids with pelvic pain    TONSILLECTOMY     WISDOM TOOTH EXTRACTION      There were no vitals filed for this visit.   Subjective Assessment - 07/10/21 0847     Subjective shoulder is a little stiff; pain only with certain movements    Pertinent History CTS, HTN, DM2, abdominal hysterectomy,    Patient Stated Goals Move better    Currently in Pain? No/denies                               [facility] Adult PT Treatment/Exercise - 07/10/21 0001       Shoulder Exercises: Standing   Other Standing Exercises scapular depression with left arm behind her back 5 sec hold x 10      Shoulder Exercises: Pulleys   Flexion 3 minutes    Scaption 3 minutes      Shoulder Exercises: Stretch   Corner Stretch 1 rep;10 seconds    Corner Stretch Limitations painful    External Rotation Stretch 2 reps;20 seconds   elbow at 90 in doorway      Manual Therapy   Manual Therapy Soft tissue mobilization;Joint mobilization    Manual therapy comments Skilled palpation and monitoring of soft tissues during DN    Joint Mobilization left shoulder PA and AP mobs for IR and ER; PA mobs bil upper thoracic and left cspine    Soft tissue mobilization to left UT    Passive ROM left shoulder ER/IR              Trigger Point Dry Needling - 07/10/21 0001     Education Handout Provided Previously provided    Muscles Treated Head and Neck Upper trapezius;Levator scapulae    Dry Needling Comments left    Upper Trapezius Response Twitch reponse elicited;Palpable increased muscle length    Levator Scapulae Response Palpable increased muscle length                  PT Education - 07/10/21 1046     Education Details ER stretch modified for HEP    Person(s) Educated Patient    Methods Explanation;Demonstration;Handout    Comprehension Verbalized understanding;Returned demonstration  PT Short Term Goals - 07/05/21 0849       PT SHORT TERM GOAL #1   Title Pt will be independent in initial HEP.    Time 3    Period Weeks    Status Achieved    Target Date 07/19/21      PT SHORT TERM GOAL #2   Title Pt will report less pain in her cervical spine and left upper trap when sitting with work to </= 4/10.    Time 4    Period Weeks    Status On-going    Target Date 07/26/21               PT Long Term Goals - 06/24/21 1042       PT LONG TERM GOAL #1   Title Pt will be independent in her advanced HEP.    Time 8    Period Weeks    Status New    Target Date 08/23/21      PT LONG TERM GOAL #2   Title Pt will improve her left shoulder flexion to >/= 150 degrees.    Time 8    Status New    Target Date 08/23/21      PT LONG TERM GOAL #3   Title Pt will improve her left shoulder ER to >/= 60 degrees with pain </= 3/10 in order to fix her hair.    Time 8    Period Weeks    Status New    Target Date  08/23/21      PT LONG TERM GOAL #4   Title pt will improve her FOTO score to >/= 63%.    Time 8    Period Weeks    Target Date 08/23/21      PT LONG TERM GOAL #5   Title Pt will be able to lift 10# from counter to overhead shelf using bilateral UE"s with no pain reported.    Time 8    Period Weeks    Status New    Target Date 08/23/21                   Plan - 07/10/21 0930     Clinical Impression Statement Patient reports good relief with pulleys and also cane stretches at home. Doorway stretch painful so modified to ER stretch with elbow at 90. Patient reluctant to try DN, but decided to give intial try of UT and levator which went well. She had positive results with increased left cervical rotation after. She has decreased PA mobility and pain in the left cspine. She would benefit from further DN to subscapularis and IS.    Comorbidities CTS, HTN, DM2, abdominal hysterectomy    PT Frequency 1x / week    PT Duration 8 weeks    PT Treatment/Interventions ADLs/Self Care Home Management;Cryotherapy;Electrical Stimulation;Iontophoresis '4mg'$ /ml Dexamethasone;Moist Heat;Traction;Ultrasound;Therapeutic exercise;Therapeutic activities;Functional mobility training;Gait training;Neuromuscular re-education;Patient/family education;Dry needling;Taping;Passive range of motion;Manual techniques;Joint Manipulations;Spinal Manipulations    PT Next Visit Plan Assess response to DN and continue as indicated to IS and subscap; cervical PA mobs, shoulder ROM, shoulder strengthening, pulleys, UBE    PT Home Exercise Plan Access Code: [email or web address]  URL: [email or web address]  Date: 06/24/2021  Prepared by: Kearney Hard    Exercises  Gentle Levator Scapulae Stretch - 2-3 x daily - 7 x weekly - 5 reps - 10 seconds hold  Seated Gentle Upper Trapezius Stretch - 2-3 x daily - 7 x weekly - 5 reps - 10 seconds  hold  Supine Shoulder Flexion Extension AAROM with Dowel - 2-3 x daily - 7 x weekly - 2  sets - 10 reps  Supine Shoulder External Rotation in 45 Degrees Abduction AAROM with Dowel - 2-3 x daily - 7 x weekly - 2 sets - 10 reps - 3-5 seconds hold  Standing Shoulder Row with Anchored Resistance - 2-3 x daily - 7 x weekly - 2 sets - 10 reps  Corner Pec Major Stretch - 2-3 x daily - 7 x weekly - 5 reps - 10 seconds hold    Consulted and Agree with Plan of Care Patient             Patient will benefit from skilled therapeutic intervention in order to improve the following deficits and impairments:  Pain, Decreased strength, Postural dysfunction, Impaired flexibility, Decreased activity tolerance, Decreased range of motion, Impaired UE functional use  Visit Diagnosis: Chronic left shoulder pain  Stiffness of left shoulder, not elsewhere classified  Cervicalgia  Muscle weakness (generalized)     Problem List Patient Active Problem List   Diagnosis Date Noted   Allergic rhinitis due to animal (cat) (dog) hair and dander 10/31/2020   Allergic rhinitis due to pollen 10/31/2020   Chronic allergic conjunctivitis 10/31/2020   Mild intermittent asthma 10/31/2020   Obesity (BMI 30-39.9) 10/31/2020   Physical exam 01/16/2015   Hypopotassemia 03/30/2013   Diabetes mellitus type II, controlled, with no complications ([place]) 99991111   CARPAL TUNNEL SYNDROME 03/30/2008   Hyperlipidemia associated with type 2 diabetes mellitus ([place]) 05/04/2007   EXOPHTHALMOS NOS 04/02/2007    Madelyn Flavors PT 07/10/2021, 10:52 AM  [facility] [address] [place], [place], [address] Phone: [phone]   Fax:  [phone]  Name: Sonia Little MRN: [id number] Date of Birth: 09/14/1966

## 2021-07-10 NOTE — Patient Instructions (Signed)
Access Code: [email or web address] URL: [email or web address] Date: 07/10/2021 Prepared by: Almyra Free  Exercises Gentle Levator Scapulae Stretch - 2-3 x daily - 7 x weekly - 5 reps - 10 seconds hold Seated Gentle Upper Trapezius Stretch - 2-3 x daily - 7 x weekly - 5 reps - 10 seconds hold Supine Shoulder Flexion Extension AAROM with Dowel - 2-3 x daily - 7 x weekly - 2 sets - 10 reps Supine Shoulder External Rotation in 45 Degrees Abduction AAROM with Dowel - 2-3 x daily - 7 x weekly - 2 sets - 10 reps - 3-5 seconds hold Standing Shoulder Row with Anchored Resistance - 2-3 x daily - 7 x weekly - 2 sets - 10 reps Corner Pec Major Stretch - 2-3 x daily - 7 x weekly - 5 reps - 10 seconds hold Standing Shoulder External Rotation Stretch in Doorway - 3 x daily - 7 x weekly - 3 reps - 1 sets - 30-60 seconds hold

## 2021-07-23 ENCOUNTER — Ambulatory Visit (INDEPENDENT_AMBULATORY_CARE_PROVIDER_SITE_OTHER): Payer: Commercial Managed Care - PPO | Admitting: Physical Therapy

## 2021-07-23 ENCOUNTER — Other Ambulatory Visit: Payer: Self-pay

## 2021-07-23 ENCOUNTER — Encounter: Payer: Self-pay | Admitting: Physical Therapy

## 2021-07-23 DIAGNOSIS — M25512 Pain in left shoulder: Secondary | ICD-10-CM

## 2021-07-23 DIAGNOSIS — G8929 Other chronic pain: Secondary | ICD-10-CM

## 2021-07-23 DIAGNOSIS — M25612 Stiffness of left shoulder, not elsewhere classified: Secondary | ICD-10-CM | POA: Diagnosis not present

## 2021-07-23 DIAGNOSIS — M6281 Muscle weakness (generalized): Secondary | ICD-10-CM | POA: Diagnosis not present

## 2021-07-23 DIAGNOSIS — M542 Cervicalgia: Secondary | ICD-10-CM | POA: Diagnosis not present

## 2021-07-23 NOTE — Therapy (Signed)
[facility] [address] [place], [place], [address] Phone: [phone]   Fax:  [phone]  Physical Therapy Treatment  Patient Details  Name: Sonia Little MRN: [id number] Date of Birth: [birth year] Referring Provider (PT): Jean Rosenthal, MD   Encounter Date: 07/23/2021   PT End of Session - 07/23/21 1130     Visit Number 4    Number of Visits 12    Date for PT Re-Evaluation 08/09/21    Authorization Type UHC    PT Start Time 1104    PT Stop Time [id number]    PT Time Calculation (min) 41 min    Activity Tolerance Patient tolerated treatment well    Behavior During Therapy WFL for tasks assessed/performed             Past Medical History:  Diagnosis Date   CTS (carpal tunnel syndrome)    Exophthalmos, unspecified    Hypertension    Other and unspecified hyperlipidemia    Type II or unspecified type diabetes mellitus without mention of complication, not stated as uncontrolled     Past Surgical History:  Procedure Laterality Date   ABDOMINAL HYSTERECTOMY  2011   For fibroids with pelvic pain    TONSILLECTOMY     WISDOM TOOTH EXTRACTION      There were no vitals filed for this visit.   Subjective Assessment - 07/23/21 1129     Subjective Pt reporting 3/10 pain in left shoulder today.    Pertinent History CTS, HTN, DM2, abdominal hysterectomy,    Diagnostic tests X-ray    Patient Stated Goals Move better    Currently in Pain? Yes    Pain Score 3     Pain Location Shoulder    Pain Orientation Left    Pain Descriptors / Indicators Sore;Tightness    Pain Onset More than a month ago                               [facility] Adult PT Treatment/Exercise - 07/23/21 0001       Shoulder Exercises: Supine   External Rotation AAROM;Left;15 reps;Theraband    External Rotation Weight (lbs) 2# holding 5 seconds    Flexion AAROM;Left;15 reps    Shoulder Flexion Weight (lbs) 2# bar holding 5 seconds       Shoulder Exercises: Standing   Row Strengthening;Both;15 reps    Theraband Level (Shoulder Row) Level 3 (Green)    Row Limitations 2 sets    Other Standing Exercises scapular depression with left arm behind her back 5 sec hold x 10      Shoulder Exercises: Pulleys   Flexion 3 minutes    Scaption 3 minutes      Shoulder Exercises: Stretch   Other Shoulder Stretches Door stretch x 3 positions holding 20 seconds each      Manual Therapy   Joint Mobilization A/P mobs left shoulder    Passive ROM abduciton and ER to pt's tolerance                      PT Short Term Goals - 07/23/21 1142       PT SHORT TERM GOAL #1   Title Pt will be independent in initial HEP.    Status Achieved      PT SHORT TERM GOAL #2   Title Pt will report less pain in her cervical spine and left upper trap when sitting  with work to </= 4/10.    Status On-going               PT Long Term Goals - 06/24/21 1042       PT LONG TERM GOAL #1   Title Pt will be independent in her advanced HEP.    Time 8    Period Weeks    Status New    Target Date 08/23/21      PT LONG TERM GOAL #2   Title Pt will improve her left shoulder flexion to >/= 150 degrees.    Time 8    Status New    Target Date 08/23/21      PT LONG TERM GOAL #3   Title Pt will improve her left shoulder ER to >/= 60 degrees with pain </= 3/10 in order to fix her hair.    Time 8    Period Weeks    Status New    Target Date 08/23/21      PT LONG TERM GOAL #4   Title pt will improve her FOTO score to >/= 63%.    Time 8    Period Weeks    Target Date 08/23/21      PT LONG TERM GOAL #5   Title Pt will be able to lift 10# from counter to overhead shelf using bilateral UE"s with no pain reported.    Time 8    Period Weeks    Status New    Target Date 08/23/21                   Plan - 07/23/21 1131     Clinical Impression Statement Pt reporting good response to DN at last visit in her upper trap.  Pt with  better response to door stretching this visit. Continue with skilled PT to maximize pt's function. Next visit consider DN to subscapularis and infraspinatus.    Personal Factors and Comorbidities Comorbidity 3+    Comorbidities CTS, HTN, DM2, abdominal hysterectomy    Examination-Activity Limitations Lift;Carry;Reach Overhead    Examination-Participation Restrictions Community Activity;Other;Laundry    Stability/Clinical Decision Making Stable/Uncomplicated    Rehab Potential Good    PT Frequency 1x / week    PT Duration 8 weeks    PT Treatment/Interventions ADLs/Self Care Home Management;Cryotherapy;Electrical Stimulation;Iontophoresis '4mg'$ /ml Dexamethasone;Moist Heat;Traction;Ultrasound;Therapeutic exercise;Therapeutic activities;Functional mobility training;Gait training;Neuromuscular re-education;Patient/family education;Dry needling;Taping;Passive range of motion;Manual techniques;Joint Manipulations;Spinal Manipulations    PT Next Visit Plan consider DN to  IS and subscap; cervical PA mobs, shoulder ROM, shoulder strengthening, pulleys, UBE    PT Home Exercise Plan Access Code: [email or web address]  URL: [email or web address]  Date: 06/24/2021  Prepared by: Kearney Hard    Exercises  Gentle Levator Scapulae Stretch - 2-3 x daily - 7 x weekly - 5 reps - 10 seconds hold  Seated Gentle Upper Trapezius Stretch - 2-3 x daily - 7 x weekly - 5 reps - 10 seconds hold  Supine Shoulder Flexion Extension AAROM with Dowel - 2-3 x daily - 7 x weekly - 2 sets - 10 reps  Supine Shoulder External Rotation in 45 Degrees Abduction AAROM with Dowel - 2-3 x daily - 7 x weekly - 2 sets - 10 reps - 3-5 seconds hold  Standing Shoulder Row with Anchored Resistance - 2-3 x daily - 7 x weekly - 2 sets - 10 reps  Corner Pec Major Stretch - 2-3 x daily - 7 x weekly - 5 reps - 10 seconds hold  Consulted and Agree with Plan of Care Patient             Patient will benefit from skilled therapeutic intervention in  order to improve the following deficits and impairments:  Pain, Decreased strength, Postural dysfunction, Impaired flexibility, Decreased activity tolerance, Decreased range of motion, Impaired UE functional use  Visit Diagnosis: Chronic left shoulder pain  Stiffness of left shoulder, not elsewhere classified  Cervicalgia  Muscle weakness (generalized)     Problem List Patient Active Problem List   Diagnosis Date Noted   Allergic rhinitis due to animal (cat) (dog) hair and dander 10/31/2020   Allergic rhinitis due to pollen 10/31/2020   Chronic allergic conjunctivitis 10/31/2020   Mild intermittent asthma 10/31/2020   Obesity (BMI 30-39.9) 10/31/2020   Physical exam 01/16/2015   Hypopotassemia 03/30/2013   Diabetes mellitus type II, controlled, with no complications ([place]) [id number]   CARPAL TUNNEL SYNDROME 03/30/2008   Hyperlipidemia associated with type 2 diabetes mellitus ([place]) 05/04/2007   EXOPHTHALMOS NOS 04/02/2007    Oretha Caprice, PT, MPT 07/23/2021, 11:43 AM  [facility] Physical Therapy [address] [place], [place], [address] Phone: [phone]   Fax:  [phone]  Name: Sonia Little MRN: [id number] Date of Birth: [birth year]

## 2021-07-24 ENCOUNTER — Encounter: Payer: Self-pay | Admitting: Orthopaedic Surgery

## 2021-07-24 ENCOUNTER — Ambulatory Visit: Payer: Commercial Managed Care - PPO | Admitting: Orthopaedic Surgery

## 2021-07-24 DIAGNOSIS — M7542 Impingement syndrome of left shoulder: Secondary | ICD-10-CM | POA: Diagnosis not present

## 2021-07-24 NOTE — Progress Notes (Signed)
HPI: Mrs. Ryle returns today for follow-up of her left shoulder.  She states that her pain is much better.  She feels it recently is 40% better.  But still has 8 out of 10 pain at worst.  No numbness tingling down the arm.  Pain in shoulder is no longer waking her up.  She does feel that the intra-articular injection did help with her shoulder pain.  She is also going to formal therapy has completed 4 of 12 scheduled visits.  She is doing a home exercise program.  She notes she still has limited overhead motion and external rotation.   Physical exam: General well-developed well-nourished female no acute distress mood and affect appropriate. Left shoulder positive impingement.  Active range of motion forward flexion 160 degrees passively and bring it under 75 degrees.  She has 5 out of 5 strength with external and internal rotation against resistance.  Positive liftoff test on the left.  Impression: Left shoulder impingement  Plan: Given the fact the patient is trending towards improvement would recommend continued physical therapy and time.  She is agreeable with this plan.  We will see her back in 6 weeks see how she is doing overall.  Questions encouraged and answered at length.

## 2021-07-30 ENCOUNTER — Encounter: Payer: Commercial Managed Care - PPO | Admitting: Physical Therapy

## 2021-08-06 ENCOUNTER — Other Ambulatory Visit: Payer: Self-pay

## 2021-08-06 ENCOUNTER — Encounter: Payer: Self-pay | Admitting: Physical Therapy

## 2021-08-06 ENCOUNTER — Ambulatory Visit (INDEPENDENT_AMBULATORY_CARE_PROVIDER_SITE_OTHER): Payer: Commercial Managed Care - PPO | Admitting: Physical Therapy

## 2021-08-06 DIAGNOSIS — M25612 Stiffness of left shoulder, not elsewhere classified: Secondary | ICD-10-CM | POA: Diagnosis not present

## 2021-08-06 DIAGNOSIS — M6281 Muscle weakness (generalized): Secondary | ICD-10-CM

## 2021-08-06 DIAGNOSIS — G8929 Other chronic pain: Secondary | ICD-10-CM

## 2021-08-06 DIAGNOSIS — M25512 Pain in left shoulder: Secondary | ICD-10-CM

## 2021-08-06 DIAGNOSIS — M542 Cervicalgia: Secondary | ICD-10-CM

## 2021-08-06 NOTE — Therapy (Signed)
[facility] [address] [place], [place], [address] Phone: [phone]   Fax:  [phone]  Physical Therapy Treatment Recert/ [place] Abide Note   Patient Details  Name: Sonia Little MRN: [id number] Date of Birth: [birth year] Referring Provider (PT): Jean Rosenthal, MD  Progress Note Reporting Period   06/24/2021 to 08/06/2021   See note below for Objective Data and Assessment of Progress/Goals.     Encounter Date: 08/06/2021   PT End of Session - 08/06/21 0815     Visit Number 5    Number of Visits 12    Date for PT Re-Evaluation 09/27/21    Authorization Type UHC    Authorization Time Period recert/Progress note sent on 08/06/2021 for 7 more weeks to cover pt's 12 visits at 1x/ week.    Progress Note Due on Visit 15    PT Start Time 0801    PT Stop Time 0840    PT Time Calculation (min) 39 min    Activity Tolerance Patient tolerated treatment well    Behavior During Therapy WFL for tasks assessed/performed             Past Medical History:  Diagnosis Date   CTS (carpal tunnel syndrome)    Exophthalmos, unspecified    Hypertension    Other and unspecified hyperlipidemia    Type II or unspecified type diabetes mellitus without mention of complication, not stated as uncontrolled     Past Surgical History:  Procedure Laterality Date   ABDOMINAL HYSTERECTOMY  2011   For fibroids with pelvic pain    TONSILLECTOMY     WISDOM TOOTH EXTRACTION      There were no vitals filed for this visit.   Subjective Assessment - 08/06/21 1110     Subjective Pt arriving today reporting 3/10 pain in left shoulder. Pt stating more stiffness.    Pertinent History CTS, HTN, DM2, abdominal hysterectomy,    Diagnostic tests X-ray    Patient Stated Goals Move better    Currently in Pain? Yes    Pain Location Shoulder    Pain Orientation Left    Pain Descriptors / Indicators Sore;Tightness    Pain Onset More than a month ago                 [facility] PT Assessment - 08/06/21 0001       Assessment   Medical Diagnosis m75.42, impingement syndrome left shoulder, M25.512 chronic left shoulder pain, M54.2 neck pain    Referring Provider (PT) Jean Rosenthal, MD    Hand Dominance Right    Prior Therapy yes, years ago for left shoulder      Precautions   Precautions None      Restrictions   Weight Bearing Restrictions No      AROM   Left Shoulder Extension 40 Degrees    Left Shoulder Flexion 140 Degrees    Left Shoulder ABduction 132 Degrees    Left Shoulder Internal Rotation 55 Degrees   shoulder abd 45 degrees   Left Shoulder External Rotation 35 Degrees   shoulder abd 45 degrees                          [facility] Adult PT Treatment/Exercise - 08/06/21 0001       Shoulder Exercises: Standing   Flexion 10 reps;Limitations    Flexion Limitations 1# bar    ABduction 10 reps;Limitations    ABduction Limitations 1# bar    Row  Strengthening;Both;15 reps    Theraband Level (Shoulder Row) Level 3 (Green)    Row Limitations 2 sets      Shoulder Exercises: Pulleys   Flexion 3 minutes    Scaption 3 minutes      Shoulder Exercises: Stretch   Wall Stretch - Flexion 5 reps;10 seconds    Wall Stretch - ABduction 3 reps;10 seconds    Other Shoulder Stretches Door stretch x 3 positions holding 20 seconds each      Manual Therapy   Joint Mobilization A/P mobs left shoulder    Passive ROM abduction and ER to pt's tolerance                       PT Short Term Goals - 08/06/21 1125       PT SHORT TERM GOAL #1   Title Pt will be independent in initial HEP.    Status Achieved      PT SHORT TERM GOAL #2   Title Pt will report less pain in her cervical spine and left upper trap when sitting with work to </= 4/10.    Status Achieved               PT Long Term Goals - 08/06/21 1126       PT LONG TERM GOAL #1   Title Pt will be independent in her advanced HEP.     Status On-going      PT LONG TERM GOAL #2   Title Pt will improve her left shoulder flexion to >/= 150 degrees.    Status On-going      PT LONG TERM GOAL #3   Title Pt will improve her left shoulder ER to >/= 60 degrees with pain </= 3/10 in order to fix her hair.    Status On-going      PT LONG TERM GOAL #4   Title pt will improve her FOTO score to >/= 63%.    Status On-going      PT LONG TERM GOAL #5   Title Pt will be able to lift 10# from counter to overhead shelf using bilateral UE"s with no pain reported.    Status On-going                   Plan - 08/06/21 0818     Clinical Impression Statement Pt stating due to her husband being in the hospital for the last 15 days she hasn't been good about doing her HEP. Pt reporting 4/10 in her left shoulder more stiffness. Pt is currently on her 5th therapy visit and due to only being able to come 1x/ week I am requesting an additional 7 weeks to reach her 12 visits. She is making great progress with overall ROM. Pt delaying DN this visit but wishes to try it again at her next appointment to subscapularis and/or infraspinatus. Continue skilled PT to maximize pt's function.    Personal Factors and Comorbidities Comorbidity 3+    Comorbidities CTS, HTN, DM2, abdominal hysterectomy    Examination-Activity Limitations Lift;Carry;Reach Overhead    Examination-Participation Restrictions Community Activity;Other;Laundry    Stability/Clinical Decision Making Stable/Uncomplicated    Rehab Potential Good    PT Frequency 1x / week    PT Duration 8 weeks    PT Treatment/Interventions ADLs/Self Care Home Management;Cryotherapy;Electrical Stimulation;Iontophoresis 4mg /ml Dexamethasone;Moist Heat;Traction;Ultrasound;Therapeutic exercise;Therapeutic activities;Functional mobility training;Gait training;Neuromuscular re-education;Patient/family education;Dry needling;Taping;Passive range of motion;Manual techniques;Joint Manipulations;Spinal  Manipulations    PT Next Visit Plan  consider DN to  IS and subscap; cervical PA mobs, shoulder ROM, shoulder strengthening    PT Home Exercise Plan Access Code: [email or web address]  URL: [email or web address]  Date: 06/24/2021  Prepared by: Kearney Hard    Exercises  Gentle Levator Scapulae Stretch - 2-3 x daily - 7 x weekly - 5 reps - 10 seconds hold  Seated Gentle Upper Trapezius Stretch - 2-3 x daily - 7 x weekly - 5 reps - 10 seconds hold  Supine Shoulder Flexion Extension AAROM with Dowel - 2-3 x daily - 7 x weekly - 2 sets - 10 reps  Supine Shoulder External Rotation in 45 Degrees Abduction AAROM with Dowel - 2-3 x daily - 7 x weekly - 2 sets - 10 reps - 3-5 seconds hold  Standing Shoulder Row with Anchored Resistance - 2-3 x daily - 7 x weekly - 2 sets - 10 reps  Corner Pec Major Stretch - 2-3 x daily - 7 x weekly - 5 reps - 10 seconds hold    Consulted and Agree with Plan of Care Patient             Patient will benefit from skilled therapeutic intervention in order to improve the following deficits and impairments:  Pain, Decreased strength, Postural dysfunction, Impaired flexibility, Decreased activity tolerance, Decreased range of motion, Impaired UE functional use  Visit Diagnosis: Chronic left shoulder pain - Plan: PT plan of care cert/re-cert  Stiffness of left shoulder, not elsewhere classified - Plan: PT plan of care cert/re-cert  Cervicalgia - Plan: PT plan of care cert/re-cert  Muscle weakness (generalized) - Plan: PT plan of care cert/re-cert     Problem List Patient Active Problem List   Diagnosis Date Noted   Allergic rhinitis due to animal (cat) (dog) hair and dander 10/31/2020   Allergic rhinitis due to pollen 10/31/2020   Chronic allergic conjunctivitis 10/31/2020   Mild intermittent asthma 10/31/2020   Obesity (BMI 30-39.9) 10/31/2020   Physical exam 01/16/2015   Hypopotassemia 03/30/2013   Diabetes mellitus type II, controlled, with no complications  ([place]) 83/66/2947   CARPAL TUNNEL SYNDROME 03/30/2008   Hyperlipidemia associated with type 2 diabetes mellitus ([place]) 05/04/2007   EXOPHTHALMOS NOS 04/02/2007    Oretha Caprice, PT, MPT 08/06/2021, 11:28 AM  [facility] [address] [place], [place], [address] Phone: [phone]   Fax:  [phone]  Name: Zameria Charnelle Bergeman MRN: [id number] Date of Birth: 03/23/1966

## 2021-08-13 ENCOUNTER — Other Ambulatory Visit: Payer: Self-pay

## 2021-08-13 ENCOUNTER — Ambulatory Visit (INDEPENDENT_AMBULATORY_CARE_PROVIDER_SITE_OTHER): Payer: Commercial Managed Care - PPO | Admitting: Physical Therapy

## 2021-08-13 DIAGNOSIS — G8929 Other chronic pain: Secondary | ICD-10-CM

## 2021-08-13 DIAGNOSIS — M25512 Pain in left shoulder: Secondary | ICD-10-CM

## 2021-08-13 DIAGNOSIS — M25612 Stiffness of left shoulder, not elsewhere classified: Secondary | ICD-10-CM | POA: Diagnosis not present

## 2021-08-13 DIAGNOSIS — M542 Cervicalgia: Secondary | ICD-10-CM

## 2021-08-13 DIAGNOSIS — M6281 Muscle weakness (generalized): Secondary | ICD-10-CM

## 2021-08-13 NOTE — Therapy (Signed)
[facility] [address] [place], [place], [address] Phone: [phone]   Fax:  [phone]  Physical Therapy Treatment  Patient Details  Name: Sonia Little MRN: [id number] Date of Birth: June 30, 1966 Referring Provider (PT): Jean Rosenthal, MD   Encounter Date: 08/13/2021   PT End of Session - 08/13/21 0833     Visit Number 6    Number of Visits 12    Date for PT Re-Evaluation 09/27/21    Authorization Type UHC    Authorization Time Period recert/Progress note sent on 08/06/2021 for 7 more weeks to cover pt's 12 visits at 1x/ week.    Progress Note Due on Visit 15    PT Start Time 0802    PT Stop Time [phone]    PT Time Calculation (min) 42 min    Activity Tolerance Patient tolerated treatment well    Behavior During Therapy WFL for tasks assessed/performed             Past Medical History:  Diagnosis Date   CTS (carpal tunnel syndrome)    Exophthalmos, unspecified    Hypertension    Other and unspecified hyperlipidemia    Type II or unspecified type diabetes mellitus without mention of complication, not stated as uncontrolled     Past Surgical History:  Procedure Laterality Date   ABDOMINAL HYSTERECTOMY  2011   For fibroids with pelvic pain    TONSILLECTOMY     WISDOM TOOTH EXTRACTION      There were no vitals filed for this visit.   Subjective Assessment - 08/13/21 0830     Subjective Pt arriving today complianing of posterior shoulder pain of 4/10.    Pertinent History CTS, HTN, DM2, abdominal hysterectomy,    Currently in Pain? Yes    Pain Score 4     Pain Location Shoulder    Pain Orientation Left    Pain Descriptors / Indicators Aching;Tightness;Sore    Pain Type Chronic pain    Pain Onset More than a month ago                               [facility] Adult PT Treatment/Exercise - 08/13/21 0001       Shoulder Exercises: Standing   Flexion Strengthening;15 reps;Theraband     Theraband Level (Shoulder Flexion) Level 2 (Red)    Flexion Limitations 1# bar x 15    ABduction Strengthening;15 reps    ABduction Limitations 1#    Row Strengthening;Both;15 reps    Theraband Level (Shoulder Row) Level 3 (Green)    Row Limitations 2 sets      Shoulder Exercises: Pulleys   Flexion 3 minutes    Scaption 3 minutes      Shoulder Exercises: ROM/Strengthening   UBE (Upper Arm Bike) L 1.5 x 4 minutes      Shoulder Exercises: Stretch   Wall Stretch - ABduction 3 reps;30 seconds    Other Shoulder Stretches door stretch x 2 in 120 degrees      Manual Therapy   Joint Mobilization grade 2-3 AP mobs left shoulder    Passive ROM Abduction and ER                       PT Short Term Goals - 08/06/21 1125       PT SHORT TERM GOAL #1   Title Pt will be independent in initial HEP.    Status  Achieved      PT SHORT TERM GOAL #2   Title Pt will report less pain in her cervical spine and left upper trap when sitting with work to </= 4/10.    Status Achieved               PT Long Term Goals - 08/06/21 1126       PT LONG TERM GOAL #1   Title Pt will be independent in her advanced HEP.    Status On-going      PT LONG TERM GOAL #2   Title Pt will improve her left shoulder flexion to >/= 150 degrees.    Status On-going      PT LONG TERM GOAL #3   Title Pt will improve her left shoulder ER to >/= 60 degrees with pain </= 3/10 in order to fix her hair.    Status On-going      PT LONG TERM GOAL #4   Title pt will improve her FOTO score to >/= 63%.    Status On-going      PT LONG TERM GOAL #5   Title Pt will be able to lift 10# from counter to overhead shelf using bilateral UE"s with no pain reported.    Status On-going                   Plan - 08/13/21 0850     Clinical Impression Statement Pt reporting 4/10 pain upon arrival. Pt tolerating more strengtheing exericses well. Still limited on abduciton, flexion and ER. Pt also reporting  pin point pain over lateral deltoid. DN performed by Faustino Congress PT, DPT. Pt instructed in continued movement with her HEP and to use heat as needed. Continue skilled PT to maximize pt's funciton.    Personal Factors and Comorbidities Comorbidity 3+    Comorbidities CTS, HTN, DM2, abdominal hysterectomy    Examination-Activity Limitations Lift;Carry;Reach Overhead    Examination-Participation Restrictions Community Activity;Other;Laundry    Stability/Clinical Decision Making Stable/Uncomplicated    Rehab Potential Good    PT Frequency 1x / week    PT Duration 8 weeks    PT Treatment/Interventions ADLs/Self Care Home Management;Cryotherapy;Electrical Stimulation;Iontophoresis 4mg /ml Dexamethasone;Moist Heat;Traction;Ultrasound;Therapeutic exercise;Therapeutic activities;Functional mobility training;Gait training;Neuromuscular re-education;Patient/family education;Dry needling;Taping;Passive range of motion;Manual techniques;Joint Manipulations;Spinal Manipulations    PT Next Visit Plan assess response to DN, shoulder ROM, shoulder strengthening    PT Home Exercise Plan Access Code: [email or web address]  URL: [email or web address]  Date: 06/24/2021  Prepared by: Kearney Hard    Exercises  Gentle Levator Scapulae Stretch - 2-3 x daily - 7 x weekly - 5 reps - 10 seconds hold  Seated Gentle Upper Trapezius Stretch - 2-3 x daily - 7 x weekly - 5 reps - 10 seconds hold  Supine Shoulder Flexion Extension AAROM with Dowel - 2-3 x daily - 7 x weekly - 2 sets - 10 reps  Supine Shoulder External Rotation in 45 Degrees Abduction AAROM with Dowel - 2-3 x daily - 7 x weekly - 2 sets - 10 reps - 3-5 seconds hold  Standing Shoulder Row with Anchored Resistance - 2-3 x daily - 7 x weekly - 2 sets - 10 reps  Corner Pec Major Stretch - 2-3 x daily - 7 x weekly - 5 reps - 10 seconds hold    Consulted and Agree with Plan of Care Patient             Patient will benefit from skilled therapeutic intervention  in order to  improve the following deficits and impairments:  Pain, Decreased strength, Postural dysfunction, Impaired flexibility, Decreased activity tolerance, Decreased range of motion, Impaired UE functional use  Visit Diagnosis: Chronic left shoulder pain  Stiffness of left shoulder, not elsewhere classified  Cervicalgia  Muscle weakness (generalized)     Problem List Patient Active Problem List   Diagnosis Date Noted   Allergic rhinitis due to animal (cat) (dog) hair and dander 10/31/2020   Allergic rhinitis due to pollen 10/31/2020   Chronic allergic conjunctivitis 10/31/2020   Mild intermittent asthma 10/31/2020   Obesity (BMI 30-39.9) 10/31/2020   Physical exam 01/16/2015   Hypopotassemia 03/30/2013   Diabetes mellitus type II, controlled, with no complications ([place]) 45/14/6047   CARPAL TUNNEL SYNDROME 03/30/2008   Hyperlipidemia associated with type 2 diabetes mellitus ([place]) 05/04/2007   EXOPHTHALMOS NOS 04/02/2007    Oretha Caprice, PT, MPT 08/13/2021, 8:55 AM  [facility] [address] [place], [place], [address] Phone: [phone]   Fax:  [phone]  Name: Sonia Little MRN: [id number] Date of Birth: 1966-05-11

## 2021-08-19 ENCOUNTER — Other Ambulatory Visit: Payer: Self-pay

## 2021-08-19 ENCOUNTER — Encounter: Payer: Self-pay | Admitting: Physical Therapy

## 2021-08-19 ENCOUNTER — Ambulatory Visit (INDEPENDENT_AMBULATORY_CARE_PROVIDER_SITE_OTHER): Payer: Commercial Managed Care - PPO | Admitting: Physical Therapy

## 2021-08-19 DIAGNOSIS — M25512 Pain in left shoulder: Secondary | ICD-10-CM | POA: Diagnosis not present

## 2021-08-19 DIAGNOSIS — M6281 Muscle weakness (generalized): Secondary | ICD-10-CM | POA: Diagnosis not present

## 2021-08-19 DIAGNOSIS — M542 Cervicalgia: Secondary | ICD-10-CM | POA: Diagnosis not present

## 2021-08-19 DIAGNOSIS — M25612 Stiffness of left shoulder, not elsewhere classified: Secondary | ICD-10-CM | POA: Diagnosis not present

## 2021-08-19 DIAGNOSIS — G8929 Other chronic pain: Secondary | ICD-10-CM

## 2021-08-19 NOTE — Therapy (Signed)
[facility] [address] [place], [place], [address] Phone: [phone]   Fax:  [phone]  Physical Therapy Treatment  Patient Details  Name: Sonia Little MRN: [id number] Date of Birth: Apr 11, 1966 Referring Provider (PT): Jean Rosenthal, MD   Encounter Date: 08/19/2021   PT End of Session - 08/19/21 1534     Visit Number 7    Number of Visits 12    Date for PT Re-Evaluation 09/27/21    Authorization Type UHC    Authorization Time Period recert/Progress note sent on 08/06/2021 for 7 more weeks to cover pt's 12 visits at 1x/ week.    Progress Note Due on Visit 15    PT Start Time 6144    PT Stop Time 1515    PT Time Calculation (min) 42 min    Activity Tolerance Patient tolerated treatment well    Behavior During Therapy WFL for tasks assessed/performed             Past Medical History:  Diagnosis Date   CTS (carpal tunnel syndrome)    Exophthalmos, unspecified    Hypertension    Other and unspecified hyperlipidemia    Type II or unspecified type diabetes mellitus without mention of complication, not stated as uncontrolled     Past Surgical History:  Procedure Laterality Date   ABDOMINAL HYSTERECTOMY  2011   For fibroids with pelvic pain    TONSILLECTOMY     WISDOM TOOTH EXTRACTION      There were no vitals filed for this visit.   Subjective Assessment - 08/19/21 1446     Subjective Pt arriving today reporting 4/10 pain in posterior shoulder.    Pertinent History CTS, HTN, DM2, abdominal hysterectomy,    Diagnostic tests X-ray    Pain Score 4     Pain Location Shoulder    Pain Orientation Left    Pain Descriptors / Indicators Aching;Sore    Pain Type Chronic pain    Pain Onset More than a month ago                [facility] PT Assessment - 08/19/21 0001       Assessment   Medical Diagnosis m75.42, impingement syndrome left shoulder, M25.512 chronic left shoulder pain, M54.2 neck pain    Referring  Provider (PT) Jean Rosenthal, MD    Hand Dominance Right      AROM   Left Shoulder Extension 40 Degrees    Left Shoulder Flexion 150 Degrees    Left Shoulder ABduction 140 Degrees    Left Shoulder Internal Rotation 58 Degrees    Left Shoulder External Rotation --   shoulder abd 45 degrees                          [facility] Adult PT Treatment/Exercise - 08/19/21 0001       Shoulder Exercises: Supine   External Rotation AAROM;Left;10 reps;Weights    External Rotation Limitations 3# bar    Flexion AROM;Strengthening;Both;15 reps;Weights    Flexion Limitations over 1/2 bolster using 3 # bar      Shoulder Exercises: Standing   Flexion Strengthening;15 reps;Theraband    Theraband Level (Shoulder Flexion) Level 2 (Red)    ABduction Strengthening;15 reps    ABduction Limitations 3# bar    Row Strengthening;Both;15 reps    Theraband Level (Shoulder Row) Level 4 (Blue)    Row Limitations 2 sets      Shoulder Exercises: Pulleys  ABduction 3 minutes      Shoulder Exercises: ROM/Strengthening   UBE (Upper Arm Bike) L3 x 8 minutes (4 minutes each direction)      Shoulder Exercises: Stretch   Other Shoulder Stretches table stretch ER x 5 holding 10 seconds      Manual Therapy   Joint Mobilization grade 2-3 AP mobs left shoulder    Soft tissue mobilization IASTM to lateral left shoulder and deltoid and infraspinatus, lateral forearm over extensor digitorum and lateral elbow    Passive ROM Abduction and ER                       PT Short Term Goals - 08/06/21 1125       PT SHORT TERM GOAL #1   Title Pt will be independent in initial HEP.    Status Achieved      PT SHORT TERM GOAL #2   Title Pt will report less pain in her cervical spine and left upper trap when sitting with work to </= 4/10.    Status Achieved               PT Long Term Goals - 08/19/21 1457       PT LONG TERM GOAL #1   Title Pt will be independent in her advanced HEP.       PT LONG TERM GOAL #2   Title Pt will improve her left shoulder flexion to >/= 150 degrees.    Status On-going      PT LONG TERM GOAL #3   Title Pt will improve her left shoulder ER to >/= 60 degrees with pain </= 3/10 in order to fix her hair.      PT LONG TERM GOAL #4   Title pt will improve her FOTO score to >/= 63%.    Status On-going      PT LONG TERM GOAL #5   Title Pt will be able to lift 10# from counter to overhead shelf using bilateral UE"s with no pain reported.    Status On-going                   Plan - 08/19/21 1449     Clinical Impression Statement Pt reporting after her last visit that she couldn't tell if the DN helped or not. Pt also reporting mild neck stiffness/soreness after last visit which could have been from the DN. Pt reported it lasted about 1 day and then went away. Pt tolerating exercises well progressing with AROM and strenghtening. Continue skilled PT.    Personal Factors and Comorbidities Comorbidity 3+    Comorbidities CTS, HTN, DM2, abdominal hysterectomy    Examination-Activity Limitations Lift;Carry;Reach Overhead    Examination-Participation Restrictions Community Activity;Other;Laundry    Stability/Clinical Decision Making Stable/Uncomplicated    Rehab Potential Good    PT Frequency 1x / week    PT Duration 8 weeks    PT Treatment/Interventions ADLs/Self Care Home Management;Cryotherapy;Electrical Stimulation;Iontophoresis 4mg /ml Dexamethasone;Moist Heat;Traction;Ultrasound;Therapeutic exercise;Therapeutic activities;Functional mobility training;Gait training;Neuromuscular re-education;Patient/family education;Dry needling;Taping;Passive range of motion;Manual techniques;Joint Manipulations;Spinal Manipulations    PT Next Visit Plan shoulder ROM, shoulder strengthening    PT Home Exercise Plan Access Code: [email or web address]  URL: [email or web address]  Date: 06/24/2021  Prepared by: Kearney Hard    Exercises  Gentle Levator  Scapulae Stretch - 2-3 x daily - 7 x weekly - 5 reps - 10 seconds hold  Seated Gentle Upper Trapezius Stretch - 2-3 x daily - 7 x weekly -  5 reps - 10 seconds hold  Supine Shoulder Flexion Extension AAROM with Dowel - 2-3 x daily - 7 x weekly - 2 sets - 10 reps  Supine Shoulder External Rotation in 45 Degrees Abduction AAROM with Dowel - 2-3 x daily - 7 x weekly - 2 sets - 10 reps - 3-5 seconds hold  Standing Shoulder Row with Anchored Resistance - 2-3 x daily - 7 x weekly - 2 sets - 10 reps  Corner Pec Major Stretch - 2-3 x daily - 7 x weekly - 5 reps - 10 seconds hold    Consulted and Agree with Plan of Care Patient             Patient will benefit from skilled therapeutic intervention in order to improve the following deficits and impairments:  Pain, Decreased strength, Postural dysfunction, Impaired flexibility, Decreased activity tolerance, Decreased range of motion, Impaired UE functional use  Visit Diagnosis: Chronic left shoulder pain  Stiffness of left shoulder, not elsewhere classified  Cervicalgia  Muscle weakness (generalized)     Problem List Patient Active Problem List   Diagnosis Date Noted   Allergic rhinitis due to animal (cat) (dog) hair and dander 10/31/2020   Allergic rhinitis due to pollen 10/31/2020   Chronic allergic conjunctivitis 10/31/2020   Mild intermittent asthma 10/31/2020   Obesity (BMI 30-39.9) 10/31/2020   Physical exam 01/16/2015   Hypopotassemia 03/30/2013   Diabetes mellitus type II, controlled, with no complications ([place]) 23/55/7322   CARPAL TUNNEL SYNDROME 03/30/2008   Hyperlipidemia associated with type 2 diabetes mellitus ([place]) 05/04/2007   EXOPHTHALMOS NOS 04/02/2007    Oretha Caprice, PT, MPT 08/19/2021, 3:35 PM  [facility] [address] [place], [place], [address] Phone: [phone]   Fax:  [phone]  Name: Sonia Little MRN: [id number] Date of Birth: 03-08-1966

## 2021-08-27 ENCOUNTER — Ambulatory Visit (INDEPENDENT_AMBULATORY_CARE_PROVIDER_SITE_OTHER): Payer: Commercial Managed Care - PPO | Admitting: Physical Therapy

## 2021-08-27 ENCOUNTER — Encounter: Payer: Self-pay | Admitting: Physical Therapy

## 2021-08-27 ENCOUNTER — Other Ambulatory Visit: Payer: Self-pay

## 2021-08-27 DIAGNOSIS — M6281 Muscle weakness (generalized): Secondary | ICD-10-CM

## 2021-08-27 DIAGNOSIS — M542 Cervicalgia: Secondary | ICD-10-CM | POA: Diagnosis not present

## 2021-08-27 DIAGNOSIS — M25512 Pain in left shoulder: Secondary | ICD-10-CM | POA: Diagnosis not present

## 2021-08-27 DIAGNOSIS — M25612 Stiffness of left shoulder, not elsewhere classified: Secondary | ICD-10-CM

## 2021-08-27 DIAGNOSIS — G8929 Other chronic pain: Secondary | ICD-10-CM

## 2021-08-27 NOTE — Therapy (Signed)
[facility] [address] [place], [place], [address] Phone: [phone]   Fax:  [phone]  Physical Therapy Treatment  Patient Details  Name: Sonia Little MRN: [id number] Date of Birth: Dec 05, 1965 Referring Provider (PT): Jean Rosenthal, MD   Encounter Date: 08/27/2021   PT End of Session - 08/27/21 1242     Visit Number 8    Number of Visits 12    Date for PT Re-Evaluation 09/27/21    Authorization Type UHC    Authorization Time Period recert/Progress note sent on 08/06/2021 for 7 more weeks to cover pt's 12 visits at 1x/ week.    Progress Note Due on Visit 15    PT Start Time 1150    PT Stop Time 1230    PT Time Calculation (min) 40 min    Activity Tolerance Patient tolerated treatment well    Behavior During Therapy WFL for tasks assessed/performed             Past Medical History:  Diagnosis Date   CTS (carpal tunnel syndrome)    Exophthalmos, unspecified    Hypertension    Other and unspecified hyperlipidemia    Type II or unspecified type diabetes mellitus without mention of complication, not stated as uncontrolled     Past Surgical History:  Procedure Laterality Date   ABDOMINAL HYSTERECTOMY  2011   For fibroids with pelvic pain    TONSILLECTOMY     WISDOM TOOTH EXTRACTION      There were no vitals filed for this visit.   Subjective Assessment - 08/27/21 1240     Subjective Pt arriving today reporting 3/10 pain in left shoulder.    Pertinent History CTS, HTN, DM2, abdominal hysterectomy,    Diagnostic tests X-ray    Patient Stated Goals Move better    Currently in Pain? Yes    Pain Score 3     Pain Location Shoulder    Pain Orientation Left    Pain Descriptors / Indicators Sore;Tightness    Pain Type Acute pain    Pain Onset More than a month ago                               [facility] Adult PT Treatment/Exercise - 08/27/21 0001       Shoulder Exercises: Standing    Flexion Strengthening;15 reps    Shoulder Flexion Weight (lbs) 2# dumbells    ABduction Strengthening;Both;20 reps;Weights    ABduction Limitations 2# dumbells    Extension Strengthening;Both;15 reps;Weights    Extension Weight (lbs) 2# dumbells      Shoulder Exercises: ROM/Strengthening   UBE (Upper Arm Bike) L3 x 8 minutes (4 minutes each direction)    Lat Pull 1 plate    Lat Pull Limitations 2x15    Cybex Row Limitations 15# 2x15      Shoulder Exercises: Stretch   Other Shoulder Stretches abduction door stretchx 3 holding 20 degrees      Manual Therapy   Passive ROM flexion, abduction and ER                       PT Short Term Goals - 08/06/21 1125       PT SHORT TERM GOAL #1   Title Pt will be independent in initial HEP.    Status Achieved      PT SHORT TERM GOAL #2   Title Pt will report less pain  in her cervical spine and left upper trap when sitting with work to </= 4/10.    Status Achieved               PT Long Term Goals - 08/27/21 1245       PT LONG TERM GOAL #1   Title Pt will be independent in her advanced HEP.    Status On-going      PT LONG TERM GOAL #2   Title Pt will improve her left shoulder flexion to >/= 150 degrees.    Status On-going      PT LONG TERM GOAL #3   Title Pt will improve her left shoulder ER to >/= 60 degrees with pain </= 3/10 in order to fix her hair.    Status On-going      PT LONG TERM GOAL #4   Title pt will improve her FOTO score to >/= 63%.    Status On-going      PT LONG TERM GOAL #5   Title Pt will be able to lift 10# from counter to overhead shelf using bilateral UE"s with no pain reported.    Status On-going                   Plan - 08/27/21 1243     Clinical Impression Statement Pt reporting soreness and stiffness in her left shoulder with 3/10 pain more on posterior/lateral shoulder extending down left arm into elbow. Pt still with limitations in left shoulder flexion, abduction, and  ER. Pt also with tightness noted in anterior capsule. DN performed today by Faustino Congress, PT, DPT. Continue skilled PT as pt tolerates to maximize function.    Personal Factors and Comorbidities Comorbidity 3+    Comorbidities CTS, HTN, DM2, abdominal hysterectomy    Examination-Activity Limitations Lift;Carry;Reach Overhead    Examination-Participation Restrictions Community Activity;Other;Laundry    Stability/Clinical Decision Making Stable/Uncomplicated    Rehab Potential Good    PT Frequency 1x / week    PT Duration 8 weeks    PT Treatment/Interventions ADLs/Self Care Home Management;Cryotherapy;Electrical Stimulation;Iontophoresis 4mg /ml Dexamethasone;Moist Heat;Traction;Ultrasound;Therapeutic exercise;Therapeutic activities;Functional mobility training;Gait training;Neuromuscular re-education;Patient/family education;Dry needling;Taping;Passive range of motion;Manual techniques;Joint Manipulations;Spinal Manipulations    PT Next Visit Plan shoulder ROM, shoulder strengthening, Assess response to DN    PT Home Exercise Plan Access Code: [email or web address]  URL: [email or web address]  Date: 06/24/2021  Prepared by: Kearney Hard    Exercises  Gentle Levator Scapulae Stretch - 2-3 x daily - 7 x weekly - 5 reps - 10 seconds hold  Seated Gentle Upper Trapezius Stretch - 2-3 x daily - 7 x weekly - 5 reps - 10 seconds hold  Supine Shoulder Flexion Extension AAROM with Dowel - 2-3 x daily - 7 x weekly - 2 sets - 10 reps  Supine Shoulder External Rotation in 45 Degrees Abduction AAROM with Dowel - 2-3 x daily - 7 x weekly - 2 sets - 10 reps - 3-5 seconds hold  Standing Shoulder Row with Anchored Resistance - 2-3 x daily - 7 x weekly - 2 sets - 10 reps  Corner Pec Major Stretch - 2-3 x daily - 7 x weekly - 5 reps - 10 seconds hold    Consulted and Agree with Plan of Care Patient             Patient will benefit from skilled therapeutic intervention in order to improve the following  deficits and impairments:  Pain, Decreased strength, Postural dysfunction, Impaired flexibility, Decreased activity tolerance,  Decreased range of motion, Impaired UE functional use  Visit Diagnosis: Chronic left shoulder pain  Stiffness of left shoulder, not elsewhere classified  Cervicalgia  Muscle weakness (generalized)     Problem List Patient Active Problem List   Diagnosis Date Noted   Allergic rhinitis due to animal (cat) (dog) hair and dander 10/31/2020   Allergic rhinitis due to pollen 10/31/2020   Chronic allergic conjunctivitis 10/31/2020   Mild intermittent asthma 10/31/2020   Obesity (BMI 30-39.9) 10/31/2020   Physical exam 01/16/2015   Hypopotassemia 03/30/2013   Diabetes mellitus type II, controlled, with no complications ([place]) 27/61/8485   CARPAL TUNNEL SYNDROME 03/30/2008   Hyperlipidemia associated with type 2 diabetes mellitus ([place]) 05/04/2007   EXOPHTHALMOS NOS 04/02/2007    Oretha Caprice, PT, MPT 08/27/2021, 12:47 PM  [facility] [address] [place], [place], [address] Phone: [phone]   Fax:  [phone]  Name: Soleia Melady Chow MRN: [id number] Date of Birth: 1966/11/15

## 2021-09-03 ENCOUNTER — Other Ambulatory Visit: Payer: Self-pay

## 2021-09-03 ENCOUNTER — Ambulatory Visit (INDEPENDENT_AMBULATORY_CARE_PROVIDER_SITE_OTHER): Payer: Commercial Managed Care - PPO | Admitting: Physical Therapy

## 2021-09-03 ENCOUNTER — Encounter: Payer: Self-pay | Admitting: Physical Therapy

## 2021-09-03 DIAGNOSIS — M542 Cervicalgia: Secondary | ICD-10-CM

## 2021-09-03 DIAGNOSIS — M6281 Muscle weakness (generalized): Secondary | ICD-10-CM | POA: Diagnosis not present

## 2021-09-03 DIAGNOSIS — M25612 Stiffness of left shoulder, not elsewhere classified: Secondary | ICD-10-CM

## 2021-09-03 DIAGNOSIS — G8929 Other chronic pain: Secondary | ICD-10-CM

## 2021-09-03 DIAGNOSIS — M25512 Pain in left shoulder: Secondary | ICD-10-CM

## 2021-09-03 NOTE — Therapy (Signed)
[facility] [address] [place], [place], [address] Phone: [phone]   Fax:  [phone]  Physical Therapy Treatment Progress Note  Patient Details  Name: Sonia Little MRN: [id number] Date of Birth: 02-19-1966 Referring Provider (PT): Jean Rosenthal, MD  Progress Note Reporting Period 06/24/2021 to 09/03/2021  See note below for Objective Data and Assessment of Progress/Goals.     Encounter Date: 09/03/2021   PT End of Session - 09/03/21 0949     Visit Number 9    Number of Visits 12    Date for PT Re-Evaluation 09/27/21    Authorization Type UHC    Authorization Time Period recert/Progress note sent on 08/06/2021 for 7 more weeks to cover pt's 12 visits at 1x/ week. PN on 09/03/2021.    Progress Note Due on Visit 15    PT Start Time 0932    PT Stop Time 1010    PT Time Calculation (min) 38 min    Activity Tolerance Patient tolerated treatment well    Behavior During Therapy WFL for tasks assessed/performed             Past Medical History:  Diagnosis Date   CTS (carpal tunnel syndrome)    Exophthalmos, unspecified    Hypertension    Other and unspecified hyperlipidemia    Type II or unspecified type diabetes mellitus without mention of complication, not stated as uncontrolled     Past Surgical History:  Procedure Laterality Date   ABDOMINAL HYSTERECTOMY  2011   For fibroids with pelvic pain    TONSILLECTOMY     WISDOM TOOTH EXTRACTION      There were no vitals filed for this visit.       [facility] PT Assessment - 09/03/21 0001       Assessment   Medical Diagnosis m75.42, impingement syndrome left shoulder, M25.512 chronic left shoulder pain, M54.2 neck pain    Referring Provider (PT) Jean Rosenthal, MD    Hand Dominance Right      Observation/Other Assessments   Focus on Therapeutic Outcomes (FOTO)  77 % (Predicted 63%)      AROM   Left Shoulder Extension 40 Degrees    Left Shoulder  Flexion 150 Degrees    Left Shoulder ABduction 140 Degrees    Left Shoulder Internal Rotation 58 Degrees                           [facility] Adult PT Treatment/Exercise - 09/03/21 0001       Shoulder Exercises: Standing   Row Strengthening;Both;20 reps;Theraband    Theraband Level (Shoulder Row) Level 4 (Blue)    Row Limitations 2 sets holding 2-3 seconds each    Diagonals AROM;Strengthening;10 reps;Weights    Diagonals Weight (lbs) 1#    Other Standing Exercises walll ladder abduction x 10    Other Standing Exercises lifting 5# weight from counter height to 1st clinic shelf x 10 and 2nd shelf x 10   using bilateral UE's to lift weight     Shoulder Exercises: ROM/Strengthening   UBE (Upper Arm Bike) L3 x 8 minutes (4 minutes each direction)    Lat Pull 2 plate;3 plate    Lat Pull Limitations 3x10      Shoulder Exercises: Stretch   Other Shoulder Stretches abduction door stretchx 3 holding 20 degrees      Manual Therapy   Passive ROM grade 2-3 AP mobs, flexion, abduction and ER  PT Short Term Goals - 08/06/21 1125       PT SHORT TERM GOAL #1   Title Pt will be independent in initial HEP.    Status Achieved      PT SHORT TERM GOAL #2   Title Pt will report less pain in her cervical spine and left upper trap when sitting with work to </= 4/10.    Status Achieved               PT Long Term Goals - 09/03/21 0955       PT LONG TERM GOAL #1   Title Pt will be independent in her advanced HEP.    Status On-going      PT LONG TERM GOAL #2   Title Pt will improve her left shoulder flexion to >/= 150 degrees.      PT LONG TERM GOAL #3   Title Pt will improve her left shoulder ER to >/= 60 degrees with pain </= 3/10 in order to fix her hair.    Status On-going      PT LONG TERM GOAL #4   Title pt will improve her FOTO score to >/= 63%.    Baseline 77%    Status Achieved      PT LONG TERM GOAL #5   Title Pt will be  able to lift 10# from counter to overhead shelf using bilateral UE"s with no pain reported.    Status On-going                   Plan - 09/03/21 0950     Clinical Impression Statement Pt still reproting soreness/stiffness in left shoulder with pain of 2/10. Pt did report increaseing pain with abduction stretching and exercises of 7-8/10. Pt reporting good response to DN after last visit. Continue skilled PT 1x/ week to progress toward improved abduction/ER and flexion of left shoulder while improving overall functional mobility.    Personal Factors and Comorbidities Comorbidity 3+    Comorbidities CTS, HTN, DM2, abdominal hysterectomy    Examination-Participation Restrictions Community Activity;Other;Laundry    Stability/Clinical Decision Making Stable/Uncomplicated    Rehab Potential Good    PT Frequency 1x / week    PT Treatment/Interventions ADLs/Self Care Home Management;Cryotherapy;Electrical Stimulation;Iontophoresis 4mg /ml Dexamethasone;Moist Heat;Traction;Ultrasound;Therapeutic exercise;Therapeutic activities;Functional mobility training;Gait training;Neuromuscular re-education;Patient/family education;Dry needling;Taping;Passive range of motion;Manual techniques;Joint Manipulations;Spinal Manipulations    PT Next Visit Plan shoulder ROM, shoulder strengthening, Assess response to DN    PT Home Exercise Plan Access Code: [email or web address]  URL: [email or web address]  Date: 06/24/2021  Prepared by: Kearney Hard    Exercises  Gentle Levator Scapulae Stretch - 2-3 x daily - 7 x weekly - 5 reps - 10 seconds hold  Seated Gentle Upper Trapezius Stretch - 2-3 x daily - 7 x weekly - 5 reps - 10 seconds hold  Supine Shoulder Flexion Extension AAROM with Dowel - 2-3 x daily - 7 x weekly - 2 sets - 10 reps  Supine Shoulder External Rotation in 45 Degrees Abduction AAROM with Dowel - 2-3 x daily - 7 x weekly - 2 sets - 10 reps - 3-5 seconds hold  Standing Shoulder Row with Anchored  Resistance - 2-3 x daily - 7 x weekly - 2 sets - 10 reps  Corner Pec Major Stretch - 2-3 x daily - 7 x weekly - 5 reps - 10 seconds hold    Consulted and Agree with Plan of Care Patient  Patient will benefit from skilled therapeutic intervention in order to improve the following deficits and impairments:  Pain, Decreased strength, Postural dysfunction, Impaired flexibility, Decreased activity tolerance, Decreased range of motion, Impaired UE functional use  Visit Diagnosis: Chronic left shoulder pain  Stiffness of left shoulder, not elsewhere classified  Cervicalgia  Muscle weakness (generalized)     Problem List Patient Active Problem List   Diagnosis Date Noted   Allergic rhinitis due to animal (cat) (dog) hair and dander 10/31/2020   Allergic rhinitis due to pollen 10/31/2020   Chronic allergic conjunctivitis 10/31/2020   Mild intermittent asthma 10/31/2020   Obesity (BMI 30-39.9) 10/31/2020   Physical exam 01/16/2015   Hypopotassemia 03/30/2013   Diabetes mellitus type II, controlled, with no complications ([place]) 93/81/8299   CARPAL TUNNEL SYNDROME 03/30/2008   Hyperlipidemia associated with type 2 diabetes mellitus ([place]) 05/04/2007   EXOPHTHALMOS NOS 04/02/2007    Oretha Caprice, PT, MPT 09/03/2021, 10:20 AM  [facility] [address] [place], [place], [address] Phone: [phone]   Fax:  [phone]  Name: Anja Azarria Balint MRN: [id number] Date of Birth: 08/21/1966

## 2021-09-04 ENCOUNTER — Encounter: Payer: Self-pay | Admitting: Orthopaedic Surgery

## 2021-09-04 ENCOUNTER — Other Ambulatory Visit: Payer: Self-pay

## 2021-09-04 ENCOUNTER — Ambulatory Visit: Payer: Commercial Managed Care - PPO | Admitting: Orthopaedic Surgery

## 2021-09-04 DIAGNOSIS — G8929 Other chronic pain: Secondary | ICD-10-CM | POA: Diagnosis not present

## 2021-09-04 DIAGNOSIS — M25512 Pain in left shoulder: Secondary | ICD-10-CM | POA: Diagnosis not present

## 2021-09-04 DIAGNOSIS — M7542 Impingement syndrome of left shoulder: Secondary | ICD-10-CM | POA: Diagnosis not present

## 2021-09-04 NOTE — Progress Notes (Signed)
The patient comes in for continued follow-up with her left shoulder.  She does have some signs of adhesive capsulitis.  She has been going to physical therapy.  She has had 1 subacromial steroid injection back in December.  She does report still having some pain with certain movements and decreased motion but she does report improvement in her motion with therapy.  There are still limitations on her left shoulder motion in terms of her external rotation as well as abduction.  She has had improvements but still has more to go in terms of getting her mobility back with that shoulder.  I talked to her about setting her up for an intra-articular steroid injection in that left shoulder joint under fluoroscopy by Dr. Ernestina Patches.  She agrees with trying this treatment plan.  She will continue physical therapy all around this injection as well.  I will see her back myself in 4 weeks and in the interim we will work on scheduling her for an intra-articular steroid injection in her left shoulder joint under fluoroscopy.  All questions and concerns were answered and addressed.

## 2021-09-10 ENCOUNTER — Encounter: Payer: Commercial Managed Care - PPO | Admitting: Physical Therapy

## 2021-09-15 ENCOUNTER — Other Ambulatory Visit: Payer: Self-pay | Admitting: Family Medicine

## 2021-09-17 ENCOUNTER — Ambulatory Visit (INDEPENDENT_AMBULATORY_CARE_PROVIDER_SITE_OTHER): Payer: Commercial Managed Care - PPO | Admitting: Physical Therapy

## 2021-09-17 ENCOUNTER — Encounter: Payer: Self-pay | Admitting: Physical Therapy

## 2021-09-17 ENCOUNTER — Other Ambulatory Visit: Payer: Self-pay

## 2021-09-17 DIAGNOSIS — M25512 Pain in left shoulder: Secondary | ICD-10-CM | POA: Diagnosis not present

## 2021-09-17 DIAGNOSIS — M542 Cervicalgia: Secondary | ICD-10-CM

## 2021-09-17 DIAGNOSIS — M25612 Stiffness of left shoulder, not elsewhere classified: Secondary | ICD-10-CM | POA: Diagnosis not present

## 2021-09-17 DIAGNOSIS — M6281 Muscle weakness (generalized): Secondary | ICD-10-CM | POA: Diagnosis not present

## 2021-09-17 DIAGNOSIS — G8929 Other chronic pain: Secondary | ICD-10-CM

## 2021-09-17 NOTE — Therapy (Signed)
[facility] [address] [place], [place], [address] Phone: [phone]   Fax:  [phone]  Physical Therapy Treatment  Patient Details  Name: Sonia Little MRN: [id number] Date of Birth: [birth year] Referring Provider (PT): Jean Rosenthal, MD   Encounter Date: 09/17/2021   PT End of Session - 09/17/21 0856     Visit Number 10    Number of Visits 12    Date for PT Re-Evaluation 09/27/21    Authorization Type UHC    Authorization Time Period recert/Progress note sent on 08/06/2021 for 7 more weeks to cover pt's 12 visits at 1x/ week. PN on 09/03/2021.    Progress Note Due on Visit 15    PT Start Time [phone]    PT Stop Time 0930    PT Time Calculation (min) 35 min    Activity Tolerance Patient tolerated treatment well    Behavior During Therapy WFL for tasks assessed/performed             Past Medical History:  Diagnosis Date   CTS (carpal tunnel syndrome)    Exophthalmos, unspecified    Hypertension    Other and unspecified hyperlipidemia    Type II or unspecified type diabetes mellitus without mention of complication, not stated as uncontrolled     Past Surgical History:  Procedure Laterality Date   ABDOMINAL HYSTERECTOMY  2011   For fibroids with pelvic pain    TONSILLECTOMY     WISDOM TOOTH EXTRACTION      There were no vitals filed for this visit.   Subjective Assessment - 09/17/21 0855     Subjective Pt arriving today reporting more stiffness in her left shoulder.    Pertinent History CTS, HTN, DM2, abdominal hysterectomy    Diagnostic tests X-ray    Patient Stated Goals Move better    Currently in Pain? Yes    Pain Score 2     Pain Location Shoulder    Pain Orientation Left    Pain Descriptors / Indicators Sore;Tightness    Pain Type Acute pain    Pain Onset More than a month ago                [facility] PT Assessment - 09/17/21 0001       Assessment   Medical Diagnosis m75.42, impingement  syndrome left shoulder, M25.512 chronic left shoulder pain, M54.2 neck pain    Referring Provider (PT) Jean Rosenthal, MD    Hand Dominance Right      Observation/Other Assessments   Focus on Therapeutic Outcomes (FOTO)  77 % (Predicted 63%)      AROM   Left Shoulder Extension 42 Degrees    Left Shoulder Flexion 150 Degrees    Left Shoulder ABduction 146 Degrees    Left Shoulder External Rotation 58 Degrees   elbow at 45 degrees                          [facility] Adult PT Treatment/Exercise - 09/17/21 0001       Shoulder Exercises: Standing   Flexion 10 reps;Strengthening;Weights    Shoulder Flexion Weight (lbs) 2# 2 sets    ABduction Strengthening;Both;10 reps;Weights    Shoulder ABduction Weight (lbs) 2# 2 sets    Diagonals AROM;Strengthening;10 reps;Weights    Diagonals Weight (lbs) 2#    Other Standing Exercises wall ladder left shoulder abduction    Other Standing Exercises 5# 1st and 2nd shelft lifting with 6# and 10#  x 5 reps each      Manual Therapy   Passive ROM PROM left shoulder fleixon, ER and abduction, STM active trigger point release to middle deltoid                       PT Short Term Goals - 08/06/21 1125       PT SHORT TERM GOAL #1   Title Pt will be independent in initial HEP.    Status Achieved      PT SHORT TERM GOAL #2   Title Pt will report less pain in her cervical spine and left upper trap when sitting with work to </= 4/10.    Status Achieved               PT Long Term Goals - 09/17/21 0902       PT LONG TERM GOAL #1   Title Pt will be independent in her advanced HEP.    Status On-going      PT LONG TERM GOAL #2   Title Pt will improve her left shoulder flexion to >/= 150 degrees.    Status On-going      PT LONG TERM GOAL #3   Title Pt will improve her left shoulder ER to >/= 60 degrees with pain </= 3/10 in order to fix her hair.    Status On-going      PT LONG TERM GOAL #4   Title pt will  improve her FOTO score to >/= 63%.    Baseline 77%    Status Achieved      PT LONG TERM GOAL #5   Title Pt will be able to lift 10# from counter to overhead shelf using bilateral UE"s with no pain reported.    Status Achieved                   Plan - 09/17/21 0857     Clinical Impression Statement Pt arriving today reproting 2/10 stiffness/soreness in her left shoulder with mild pain in left sided neck. Pt reporting she is getting an injection on Monday in her left shoulder. We discussed upcoming discharge in the next two visits. Pt has made great progress with therapy and is compliant in her home exercises. Pt still with limitiations in abduction and ER. Continue with skilled PT toward goals set.    Personal Factors and Comorbidities Comorbidity 3+    Comorbidities CTS, HTN, DM2, abdominal hysterectomy    Examination-Activity Limitations Lift;Carry;Reach Overhead    Examination-Participation Restrictions Community Activity;Other;Laundry    Stability/Clinical Decision Making Stable/Uncomplicated    Rehab Potential Good    PT Frequency 1x / week    PT Duration 8 weeks    PT Treatment/Interventions ADLs/Self Care Home Management;Cryotherapy;Electrical Stimulation;Iontophoresis 4mg /ml Dexamethasone;Moist Heat;Traction;Ultrasound;Therapeutic exercise;Therapeutic activities;Functional mobility training;Gait training;Neuromuscular re-education;Patient/family education;Dry needling;Taping;Passive range of motion;Manual techniques;Joint Manipulations;Spinal Manipulations    PT Next Visit Plan shoulder ROM, shoulder strengthening,    PT Home Exercise Plan Access Code: [email or web address]  URL: [email or web address]  Date: 06/24/2021  Prepared by: Kearney Hard    Exercises  Gentle Levator Scapulae Stretch - 2-3 x daily - 7 x weekly - 5 reps - 10 seconds hold  Seated Gentle Upper Trapezius Stretch - 2-3 x daily - 7 x weekly - 5 reps - 10 seconds hold  Supine Shoulder Flexion Extension  AAROM with Dowel - 2-3 x daily - 7 x weekly - 2 sets - 10 reps  Supine Shoulder External Rotation in 45  Degrees Abduction AAROM with Dowel - 2-3 x daily - 7 x weekly - 2 sets - 10 reps - 3-5 seconds hold  Standing Shoulder Row with Anchored Resistance - 2-3 x daily - 7 x weekly - 2 sets - 10 reps  Corner Pec Major Stretch - 2-3 x daily - 7 x weekly - 5 reps - 10 seconds hold    Consulted and Agree with Plan of Care Patient             Patient will benefit from skilled therapeutic intervention in order to improve the following deficits and impairments:  Pain, Decreased strength, Postural dysfunction, Impaired flexibility, Decreased activity tolerance, Decreased range of motion, Impaired UE functional use  Visit Diagnosis: Chronic left shoulder pain  Stiffness of left shoulder, not elsewhere classified  Cervicalgia  Muscle weakness (generalized)     Problem List Patient Active Problem List   Diagnosis Date Noted   Allergic rhinitis due to animal (cat) (dog) hair and dander 10/31/2020   Allergic rhinitis due to pollen 10/31/2020   Chronic allergic conjunctivitis 10/31/2020   Mild intermittent asthma 10/31/2020   Obesity (BMI 30-39.9) 10/31/2020   Physical exam 01/16/2015   Hypopotassemia 03/30/2013   Diabetes mellitus type II, controlled, with no complications ([place]) 90/38/3338   CARPAL TUNNEL SYNDROME 03/30/2008   Hyperlipidemia associated with type 2 diabetes mellitus ([place]) 05/04/2007   EXOPHTHALMOS NOS 04/02/2007    Oretha Caprice, PT, MPT 09/17/2021, 9:34 AM  [facility] [address] [place], [place], [address] Phone: [phone]   Fax:  [phone]  Name: Wendi Aleesha Ringstad MRN: [id number] Date of Birth: 09-02-1966

## 2021-09-23 ENCOUNTER — Ambulatory Visit: Payer: Self-pay

## 2021-09-23 ENCOUNTER — Other Ambulatory Visit: Payer: Self-pay

## 2021-09-23 ENCOUNTER — Ambulatory Visit (INDEPENDENT_AMBULATORY_CARE_PROVIDER_SITE_OTHER): Payer: Commercial Managed Care - PPO | Admitting: Physical Medicine and Rehabilitation

## 2021-09-23 ENCOUNTER — Encounter: Payer: Self-pay | Admitting: Physical Medicine and Rehabilitation

## 2021-09-23 DIAGNOSIS — M25512 Pain in left shoulder: Secondary | ICD-10-CM | POA: Diagnosis not present

## 2021-09-23 DIAGNOSIS — G8929 Other chronic pain: Secondary | ICD-10-CM | POA: Diagnosis not present

## 2021-09-23 NOTE — Progress Notes (Signed)
Pt state left shoulder pain. Pt state when lifting her left arm in makes the pain worse. Pt state she takes over the counter pain meds to help ease her pain.  Numeric Pain Rating Scale and Functional Assessment Average Pain 5   In the last MONTH (on 0-10 scale) has pain interfered with the following?  1. General activity like being  able to carry out your everyday physical activities such as walking, climbing stairs, carrying groceries, or moving a chair?  Rating(10)    -BT, -Dye Allergies.

## 2021-09-23 NOTE — Progress Notes (Signed)
Lekesha Tynia Wiers - 55 y.o. female MRN [id number]  Date of birth: Jun 21, 1966  Office Visit Note: Visit Date: 09/23/2021 PCP: Midge Minium, MD Referred by: Midge Minium, MD  Subjective: Chief Complaint  Patient presents with   Left Shoulder - Pain   HPI:  Refugia Lotta Frankenfield is a 55 y.o. female who comes in today at the request of Dr. Jean Rosenthal for planned Left anesthetic glenohumeral arthrogram with fluoroscopic guidance.  The patient has failed conservative care including home exercise, medications, time and activity modification.  This injection will be diagnostic and hopefully therapeutic.  Please see requesting physician notes for further details and justification.  ROS Otherwise per HPI.  Assessment & Plan: Visit Diagnoses:    ICD-10-CM   1. Chronic left shoulder pain  M25.512 XR C-ARM NO REPORT   G89.29       Plan: No additional findings.   Meds & Orders: No orders of the defined types were placed in this encounter.   Orders Placed This Encounter  Procedures   Large Joint Inj   XR C-ARM NO REPORT    Follow-up: Return for visit to requesting physician as needed.   Procedures: Large Joint Inj: L glenohumeral on 09/23/2021 3:00 PM Indications: pain and diagnostic evaluation Details: 22 G 3.5 in needle, fluoroscopy-guided anteromedial approach  Arthrogram: No  Medications: 5 mL bupivacaine 0.25 %; 40 mg triamcinolone acetonide 40 MG/ML Outcome: tolerated well, no immediate complications  There was excellent flow of contrast producing a partial arthrogram of the glenohumeral joint. The patient did have relief of symptoms during the anesthetic phase of the injection. Procedure, treatment alternatives, risks and benefits explained, specific risks discussed. Consent was given by the patient. Immediately prior to procedure a time out was called to verify the correct patient, procedure, equipment, support staff and site/side marked as required.  Patient was prepped and draped in the usual sterile fashion.         Clinical History: MRI OF THE LEFT SHOULDER WITHOUT CONTRAST   TECHNIQUE: Multiplanar, multisequence MR imaging of the shoulder was performed. No intravenous contrast was administered.   COMPARISON:  Left shoulder radiograph 05/06/2021   FINDINGS: Rotator cuff: The supraspinatus and infraspinatus tendons are intact. Teres minor tendon is intact. Mild insertional tendinosis and articular sided fraying of the subscapularis tendon.   Muscles: No muscle atrophy or edema. No intramuscular fluid collection or hematoma.   Biceps Long Head: Intra-articular long head biceps tendinosis. The extra-articular long head biceps tendon is intact within the bicipital groove.   Acromioclavicular Joint: Minimal arthropathy of the acromioclavicular joint. Minimal subacromial/subdeltoid bursal fluid.   Glenohumeral Joint: Minimal joint effusion which is collecting the subcoracoid region and along the biceps tendon. No chondral defect.   Labrum: Grossly intact, but evaluation is limited by lack of intraarticular fluid/contrast.   Bones: No fracture or dislocation. No aggressive osseous lesion.   Other: There is mild soft tissue edema along the inferior glenohumeral ligament anterior band.   IMPRESSION: Mild insertional tendinosis and articular sided fraying of the subscapularis tendon. No high-grade or retracted cuff tear. No muscle atrophy.   Intra-articular long head biceps tendinosis. No evidence of labral tear.   Mild subacromial-subdeltoid bursitis.  Minimal joint effusion.   Edema signal along the inferior glenohumeral ligament which can be seen in the setting of adhesive capsulitis.     Electronically Signed   By: Maurine Simmering   On: 06/06/2021 21:12     Objective:  VS:  HT:  WT:   BMI:     BP:   HR: bpm  TEMP: ( )  RESP:  Physical Exam   Imaging: No results found.

## 2021-09-24 ENCOUNTER — Encounter: Payer: Self-pay | Admitting: Physical Therapy

## 2021-09-24 ENCOUNTER — Ambulatory Visit (INDEPENDENT_AMBULATORY_CARE_PROVIDER_SITE_OTHER): Payer: Commercial Managed Care - PPO | Admitting: Physical Therapy

## 2021-09-24 DIAGNOSIS — M6281 Muscle weakness (generalized): Secondary | ICD-10-CM | POA: Diagnosis not present

## 2021-09-24 DIAGNOSIS — M25612 Stiffness of left shoulder, not elsewhere classified: Secondary | ICD-10-CM | POA: Diagnosis not present

## 2021-09-24 DIAGNOSIS — M542 Cervicalgia: Secondary | ICD-10-CM

## 2021-09-24 DIAGNOSIS — M25512 Pain in left shoulder: Secondary | ICD-10-CM | POA: Diagnosis not present

## 2021-09-24 DIAGNOSIS — G8929 Other chronic pain: Secondary | ICD-10-CM

## 2021-09-24 NOTE — Patient Instructions (Signed)
Access Code: [email or web address] URL: [email or web address] Date: 09/24/2021 Prepared by: Kearney Hard  Exercises Gentle Levator Scapulae Stretch - 2-3 x daily - 7 x weekly - 5 reps - 10 seconds hold Seated Gentle Upper Trapezius Stretch - 2-3 x daily - 7 x weekly - 5 reps - 10 seconds hold Supine Shoulder Flexion Extension AAROM with Dowel - 2-3 x daily - 7 x weekly - 2 sets - 10 reps Supine Shoulder External Rotation in 45 Degrees Abduction AAROM with Dowel - 2-3 x daily - 7 x weekly - 2 sets - 10 reps - 3-5 seconds hold Standing Shoulder Row with Anchored Resistance - 2-3 x daily - 7 x weekly - 2 sets - 10 reps Corner Pec Major Stretch - 2-3 x daily - 7 x weekly - 5 reps - 10 seconds hold Standing Shoulder External Rotation Stretch in Doorway - 3 x daily - 7 x weekly - 1 sets - 3 reps - 30-60 seconds hold Seated Shoulder External Rotation PROM on Table - 2 x daily - 7 x weekly - 10 reps - 15-20 seconds hold Standing Shoulder Diagonal Horizontal Abduction 60/120 Degrees with Resistance - 1 x daily - 7 x weekly - 10 reps Wall Push Up - 1 x daily - 7 x weekly - 2 sets - 10 reps Push Up on Table - 1 x daily - 7 x weekly - 3 sets - 10 reps Standing Overhead Shoulder External Rotation Stretch with Towel - 2 x daily - 7 x weekly - 10 reps - 5-10 seconds hold Standing Full Range Shoulder Flexion with Dumbbells - 1 x daily - 7 x weekly - 2 sets - 10 reps Standing Shoulder Horizontal Abduction with Dumbbells - Thumbs Up - 1 x daily - 7 x weekly - 3 sets - 10 reps Seated Shoulder Abduction with Dumbbells - 1 x daily - 7 x weekly - 2 sets - 10 reps Single Arm Shoulder Extension with Dumbbell - 1 x daily - 7 x weekly - 3 sets - 10 reps

## 2021-09-24 NOTE — Therapy (Signed)
[facility] [address] [place], [place], [address] Phone: [phone]   Fax:  [phone]  Physical Therapy Treatment Discharge  Patient Details  Name: Sonia Little MRN: [id number] Date of Birth: 07/04/1966 Referring Provider (PT): Jean Rosenthal, MD   Encounter Date: 09/24/2021   PT End of Session - 09/24/21 0853     Visit Number 11    Number of Visits 12    Date for PT Re-Evaluation 09/27/21    Authorization Type U[employer]    Authorization Time Period recert/Progress note sent on 08/06/2021 for 7 more weeks to cover pt's 12 visits at 1x/ week. PN on 09/03/2021.    Progress Note Due on Visit 15    PT Start Time [phone]    PT Stop Time 0930    PT Time Calculation (min) 41 min    Activity Tolerance Patient tolerated treatment well    Behavior During Therapy WFL for tasks assessed/performed             Past Medical History:  Diagnosis Date   CTS (carpal tunnel syndrome)    Exophthalmos, unspecified    Hypertension    Other and unspecified hyperlipidemia    Type II or unspecified type diabetes mellitus without mention of complication, not stated as uncontrolled     Past Surgical History:  Procedure Laterality Date   ABDOMINAL HYSTERECTOMY  2011   For fibroids with pelvic pain    TONSILLECTOMY     WISDOM TOOTH EXTRACTION      There were no vitals filed for this visit.   Subjective Assessment - 09/24/21 0852     Subjective Pt arriving reporting 4/10 pain in left shoulder.    Pertinent History CTS, HTN, DM2, abdominal hysterectomy    Diagnostic tests X-ray    Patient Stated Goals Move better    Currently in Pain? Yes    Pain Score 4     Pain Location Shoulder    Pain Orientation Left    Pain Descriptors / Indicators Sore;Tightness    Pain Type Acute pain    Pain Onset More than a month ago                [facility] PT Assessment - 09/24/21 0001       Assessment   Medical Diagnosis m75.42, impingement  syndrome left shoulder, M25.512 chronic left shoulder pain, M54.2 neck pain    Referring Provider (PT) Jean Rosenthal, MD    Hand Dominance Right      Observation/Other Assessments   Focus on Therapeutic Outcomes (FOTO)  77 % (Predicted 63%)      AROM   Left Shoulder Extension 42 Degrees    Left Shoulder Flexion 152 Degrees    Left Shoulder ABduction 150 Degrees    Left Shoulder External Rotation 60 Degrees                           [facility] Adult PT Treatment/Exercise - 09/24/21 0001       Exercises   Exercises Other Exercises    Other Exercises  wall push ups x 10, shoulder diagonals x 10 each direction, shoulder flexion, abduciton, horizontal abduction, extension, IR and ER stretching, levator stretch, upper trap stretch, corner stretch   we reviewed all pt's HEP                      PT Short Term Goals - 08/06/21 1125  PT SHORT TERM GOAL #1   Title Pt will be independent in initial HEP.    Status Achieved      PT SHORT TERM GOAL #2   Title Pt will report less pain in her cervical spine and left upper trap when sitting with work to </= 4/10.    Status Achieved               PT Long Term Goals - 09/24/21 0858       PT LONG TERM GOAL #1   Title Pt will be independent in her advanced HEP.    Status Achieved      PT LONG TERM GOAL #2   Title Pt will improve her left shoulder flexion to >/= 150 degrees.      PT LONG TERM GOAL #3   Title Pt will improve her left shoulder ER to >/= 60 degrees with pain </= 3/10 in order to fix her hair.    Status Partially Met      PT LONG TERM GOAL #4   Title pt will improve her FOTO score to >/= 63%.      PT LONG TERM GOAL #5   Title Pt will be able to lift 10# from counter to overhead shelf using bilateral UE"s with no pain reported.    Status Achieved                   Plan - 09/24/21 0904     Clinical Impression Statement Pt arriving today with more stiffness/soreness in  her left shoulder of 4/10. Pt has made excellent progress with therapy although still limited with ER. Pt is being discharged from skilled PT services. Pt was instructed to contact Dr. Ninfa Linden for a new referral if any problems arise or she wasn't pleased with her  home progress. Pts HEP was updated and theraband issued for progression. Pt pleased with overall funcitonal mobility.    Personal Factors and Comorbidities Comorbidity 3+    Comorbidities CTS, HTN, DM2, abdominal hysterectomy    Examination-Activity Limitations Lift;Carry;Reach Overhead    Examination-Participation Restrictions Community Activity;Other;Laundry    Stability/Clinical Decision Making Stable/Uncomplicated    Rehab Potential Good    PT Frequency 1x / week    PT Duration 8 weeks    PT Treatment/Interventions ADLs/Self Care Home Management;Cryotherapy;Electrical Stimulation;Iontophoresis 4mg /ml Dexamethasone;Moist Heat;Traction;Ultrasound;Therapeutic exercise;Therapeutic activities;Functional mobility training;Gait training;Neuromuscular re-education;Patient/family education;Dry needling;Taping;Passive range of motion;Manual techniques;Joint Manipulations;Spinal Manipulations    PT Next Visit Plan discharge    PT Home Exercise Plan Access Code: [email or web address]  URL: [email or web address]  Date: 09/24/2021  Prepared by: Kearney Hard    Exercises  Gentle Levator Scapulae Stretch - 2-3 x daily - 7 x weekly - 5 reps - 10 seconds hold  Seated Gentle Upper Trapezius Stretch - 2-3 x daily - 7 x weekly - 5 reps - 10 seconds hold  Supine Shoulder Flexion Extension AAROM with Dowel - 2-3 x daily - 7 x weekly - 2 sets - 10 reps  Supine Shoulder External Rotation in 45 Degrees Abduction AAROM with Dowel - 2-3 x daily - 7 x weekly - 2 sets - 10 reps - 3-5 seconds hold  Standing Shoulder Row with Anchored Resistance - 2-3 x daily - 7 x weekly - 2 sets - 10 reps  Corner Pec Major Stretch - 2-3 x daily - 7 x weekly - 5 reps - 10 seconds  hold  Standing Shoulder External Rotation Stretch in Doorway - 3 x daily - 7 x  weekly - 1 sets - 3 reps - 30-60 seconds hold  Seated Shoulder External Rotation PROM on Table - 2 x daily - 7 x weekly - 10 reps - 15-20 seconds hold  Standing Shoulder Diagonal Horizontal Abduction 60/120 Degrees with Resistance - 1 x daily - 7 x weekly - 10 reps  Wall Push Up - 1 x daily - 7 x weekly - 2 sets - 10 reps  Push Up on Table - 1 x daily - 7 x weekly - 3 sets - 10 reps  Standing Overhead Shoulder External Rotation Stretch with Towel - 2 x daily - 7 x weekly - 10 reps - 5-10 seconds hold  Standing Full Range Shoulder Flexion with Dumbbells - 1 x daily - 7 x weekly - 2 sets - 10 reps  Standing Shoulder Horizontal Abduction with Dumbbells - Thumbs Up - 1 x daily - 7 x weekly - 3 sets - 10 reps  Seated Shoulder Abduction with Dumbbells - 1 x daily - 7 x weekly - 2 sets - 10 reps  Single Arm Shoulder Extension with Dumbbell - 1 x daily - 7 x weekly - 3 sets - 10 reps    Consulted and Agree with Plan of Care Patient             Patient will benefit from skilled therapeutic intervention in order to improve the following deficits and impairments:  Pain, Decreased strength, Postural dysfunction, Impaired flexibility, Decreased activity tolerance, Decreased range of motion, Impaired UE functional use  Visit Diagnosis: Chronic left shoulder pain  Stiffness of left shoulder, not elsewhere classified  Cervicalgia  Muscle weakness (generalized)     Problem List Patient Active Problem List   Diagnosis Date Noted   Allergic rhinitis due to animal (cat) (dog) hair and dander 10/31/2020   Allergic rhinitis due to pollen 10/31/2020   Chronic allergic conjunctivitis 10/31/2020   Mild intermittent asthma 10/31/2020   Obesity (BMI 30-39.9) 10/31/2020   Physical exam 01/16/2015   Hypopotassemia 03/30/2013   Diabetes mellitus type II, controlled, with no complications ([place]) 85/27/7824   CARPAL TUNNEL SYNDROME  03/30/2008   Hyperlipidemia associated with type 2 diabetes mellitus ([place]) 05/04/2007   EXOPHTHALMOS NOS 04/02/2007  PHYSICAL THERAPY DISCHARGE SUMMARY  Visits from Start of Care: 11  Current functional level related to goals / functional outcomes: See above   Remaining deficits: See above   Education / Equipment: HEP advanced   Patient agrees to discharge. Patient goals were partially met. Patient is being discharged due to being pleased with the current functional level.   Oretha Caprice, PT, MPT 09/24/2021, 9:29 AM  [facility] [address] [place], [place], [address] Phone: [phone]   Fax:  [phone]  Name: Sonia Little MRN: [id number] Date of Birth: 08/04/1966

## 2021-09-29 MED ORDER — BUPIVACAINE HCL 0.25 % IJ SOLN
5.0000 mL | INTRAMUSCULAR | Status: AC | PRN
  Administered 2021-09-23: 5 mL via INTRA_ARTICULAR

## 2021-09-29 MED ORDER — TRIAMCINOLONE ACETONIDE 40 MG/ML IJ SUSP
40.0000 mg | INTRAMUSCULAR | Status: AC | PRN
  Administered 2021-09-23: 40 mg via INTRA_ARTICULAR

## 2021-10-02 ENCOUNTER — Ambulatory Visit: Payer: Commercial Managed Care - PPO | Admitting: Orthopaedic Surgery

## 2021-10-02 ENCOUNTER — Encounter: Payer: Self-pay | Admitting: Orthopaedic Surgery

## 2021-10-02 DIAGNOSIS — G8929 Other chronic pain: Secondary | ICD-10-CM | POA: Diagnosis not present

## 2021-10-02 DIAGNOSIS — M25512 Pain in left shoulder: Secondary | ICD-10-CM | POA: Diagnosis not present

## 2021-10-02 NOTE — Progress Notes (Signed)
The patient comes in today for reevaluation of her left shoulder after having an intra-articular injection by Dr. Ernestina Patches in the left shoulder joint.  She was developing some arthrofibrosis of the shoulder itself.  She says the injection has helped greatly.  This was done about 9 days ago.  Her shoulder motion now on the left side is almost entirely full.  Her pain is minimal.  She has excellent strength with her left shoulder.  Both she and I are very satisfied.  She will continue home exercise program with range of motion of her shoulder.  Follow-up at this point can be as needed.  If things worsen at all she knows to let us know.

## 2021-10-14 ENCOUNTER — Other Ambulatory Visit: Payer: Self-pay | Admitting: Family Medicine

## 2021-10-14 DIAGNOSIS — Z1231 Encounter for screening mammogram for malignant neoplasm of breast: Secondary | ICD-10-CM

## 2021-10-30 ENCOUNTER — Ambulatory Visit (INDEPENDENT_AMBULATORY_CARE_PROVIDER_SITE_OTHER): Payer: Commercial Managed Care - PPO | Admitting: Family Medicine

## 2021-10-30 ENCOUNTER — Encounter: Payer: Self-pay | Admitting: Family Medicine

## 2021-10-30 VITALS — BP 130/90 | HR 70 | Temp 98.1°F | Resp 16 | Wt 156.4 lb

## 2021-10-30 DIAGNOSIS — F4321 Adjustment disorder with depressed mood: Secondary | ICD-10-CM | POA: Diagnosis not present

## 2021-10-30 DIAGNOSIS — R030 Elevated blood-pressure reading, without diagnosis of hypertension: Secondary | ICD-10-CM | POA: Diagnosis not present

## 2021-10-30 DIAGNOSIS — E785 Hyperlipidemia, unspecified: Secondary | ICD-10-CM

## 2021-10-30 DIAGNOSIS — E119 Type 2 diabetes mellitus without complications: Secondary | ICD-10-CM | POA: Diagnosis not present

## 2021-10-30 DIAGNOSIS — E1169 Type 2 diabetes mellitus with other specified complication: Secondary | ICD-10-CM

## 2021-10-30 NOTE — Progress Notes (Signed)
° °  Subjective:    Patient ID: Sonia Little, female    DOB: 1966/11/09, 55 y.o.   MRN: [id number]  HPI DM- chronic problem, on Metformin XR 500mg  BID.  On ACE for renal protection.  UTD on foot exam.  Due for repeat eye exam.  Denies CP, SOB, HAs, visual changes.  No numbness/tingling of hands or feet.  Denies symptomatic lows.  Hyperlipidemia- chronic problem, on Lipitor 40mg  daily.  Denies abd pain, N/V.  Elevated BP- pt on low dose Ramipril for renal protection.  BP mildly elevated today- she is under considerable stress b/c husband isn't doing well.  Is meeting w/ Hospice later today.  She reports she is not interested in medication but is feeling 'completely overwhelmed'  Has good support system.   Review of Systems For ROS see HPI   This visit occurred during the SARS-CoV-2 public health emergency.  Safety protocols were in place, including screening questions prior to the visit, additional usage of staff PPE, and extensive cleaning of exam room while observing appropriate contact time as indicated for disinfecting solutions.      Objective:   Physical Exam Vitals reviewed.  Constitutional:      General: She is not in acute distress.    Appearance: Normal appearance. She is well-developed. She is not ill-appearing.  HENT:     Head: Normocephalic and atraumatic.  Eyes:     Conjunctiva/sclera: Conjunctivae normal.     Pupils: Pupils are equal, round, and reactive to light.  Neck:     Thyroid: No thyromegaly.  Cardiovascular:     Rate and Rhythm: Normal rate and regular rhythm.     Pulses: Normal pulses.     Heart sounds: Normal heart sounds. No murmur heard. Pulmonary:     Effort: Pulmonary effort is normal. No respiratory distress.     Breath sounds: Normal breath sounds.  Abdominal:     General: There is no distension.     Palpations: Abdomen is soft.     Tenderness: There is no abdominal tenderness.  Musculoskeletal:     Cervical back: Normal range of motion and  neck supple.     Right lower leg: No edema.     Left lower leg: No edema.  Lymphadenopathy:     Cervical: No cervical adenopathy.  Skin:    General: Skin is warm and dry.  Neurological:     General: No focal deficit present.     Mental Status: She is alert and oriented to person, place, and time.  Psychiatric:        Mood and Affect: Mood normal.        Behavior: Behavior normal.          Assessment & Plan:

## 2021-10-30 NOTE — Assessment & Plan Note (Signed)
Chronic problem.  On Metformin XR 500mg  BID w/o difficulty.  On ACE for renal protection.  Due for eye exam- pt to schedule.  Check labs.  Adjust meds prn

## 2021-10-30 NOTE — Assessment & Plan Note (Signed)
New.  Pt has meeting w/ Hospice later this afternoon so I'm surprised it's not higher than it is.  Will continue to monitor at future visits.

## 2021-10-30 NOTE — Assessment & Plan Note (Signed)
Chronic problem.  On Lipitor 40mg daily w/o difficulty.  Check labs.  Adjust meds prn  ?

## 2021-10-30 NOTE — Patient Instructions (Signed)
Follow up in 6-8 weeks to recheck mood (sooner if needed) Go to [place] to get your blood work done Schedule your eye exam at your convenience and have them send me a copy of the report Try and eat a low carb diet and walk regularly (more for a brain break than anything else) Call with any questions or concerns Stay Safe!  Stay Healthy! Hang in there!!! Happy Holidays!

## 2021-10-30 NOTE — Assessment & Plan Note (Signed)
New.  Pt's husband is dying of pancreatic cancer.  They meet w/ Hospice later today.  She feels she has a good support system of friends and family but is still having a hard time and feels completely overwhelmed.  She is not interested in medication at this time.  Will follow closely.

## 2021-11-04 ENCOUNTER — Other Ambulatory Visit (INDEPENDENT_AMBULATORY_CARE_PROVIDER_SITE_OTHER): Payer: Commercial Managed Care - PPO

## 2021-11-04 DIAGNOSIS — E1169 Type 2 diabetes mellitus with other specified complication: Secondary | ICD-10-CM | POA: Diagnosis not present

## 2021-11-04 DIAGNOSIS — E785 Hyperlipidemia, unspecified: Secondary | ICD-10-CM | POA: Diagnosis not present

## 2021-11-04 DIAGNOSIS — E119 Type 2 diabetes mellitus without complications: Secondary | ICD-10-CM

## 2021-11-04 LAB — CBC WITH DIFFERENTIAL/PLATELET
Basophils Absolute: 0 10*3/uL (ref 0.0–0.1)
Basophils Relative: 0.7 % (ref 0.0–3.0)
Eosinophils Absolute: 0 10*3/uL (ref 0.0–0.7)
Eosinophils Relative: 0.7 % (ref 0.0–5.0)
HCT: 39 % (ref 36.0–46.0)
Hemoglobin: 12.9 g/dL (ref 12.0–15.0)
Lymphocytes Relative: 54.7 % — ABNORMAL HIGH (ref 12.0–46.0)
Lymphs Abs: 2 10*3/uL (ref 0.7–4.0)
MCHC: 33 g/dL (ref 30.0–36.0)
MCV: 93.3 fl (ref 78.0–100.0)
Monocytes Absolute: 0.2 10*3/uL (ref 0.1–1.0)
Monocytes Relative: 6.2 % (ref 3.0–12.0)
Neutro Abs: 1.4 10*3/uL (ref 1.4–7.7)
Neutrophils Relative %: 37.7 % — ABNORMAL LOW (ref 43.0–77.0)
Platelets: 319 10*3/uL (ref 150.0–400.0)
RBC: 4.18 Mil/uL (ref 3.87–5.11)
RDW: 13.9 % (ref 11.5–15.5)
WBC: 3.7 10*3/uL — ABNORMAL LOW (ref 4.0–10.5)

## 2021-11-04 LAB — LIPID PANEL
Cholesterol: 170 mg/dL (ref 0–200)
HDL: 56.6 mg/dL (ref >39.00)
LDL Cholesterol: 102 mg/dL — ABNORMAL HIGH (ref 0–99)
NonHDL: 113.37
Total CHOL/HDL Ratio: 3
Triglycerides: 55 mg/dL (ref 0.0–149.0)
VLDL: 11 mg/dL (ref 0.0–40.0)

## 2021-11-04 LAB — TSH: TSH: 2.67 u[IU]/mL (ref 0.35–5.50)

## 2021-11-04 LAB — HEPATIC FUNCTION PANEL
ALT: 35 U/L (ref 0–35)
AST: 20 U/L (ref 0–37)
Albumin: 4.5 g/dL (ref 3.5–5.2)
Alkaline Phosphatase: 42 U/L (ref 39–117)
Bilirubin, Direct: 0.1 mg/dL (ref 0.0–0.3)
Total Bilirubin: 0.9 mg/dL (ref 0.2–1.2)
Total Protein: 6.7 g/dL (ref 6.0–8.3)

## 2021-11-04 LAB — BASIC METABOLIC PANEL
BUN: 10 mg/dL (ref 6–23)
CO2: 30 mEq/L (ref 19–32)
Calcium: 9.7 mg/dL (ref 8.4–10.5)
Chloride: 103 mEq/L (ref 96–112)
Creatinine, Ser: 0.61 mg/dL (ref 0.40–1.20)
GFR: 101.32 mL/min (ref >60.00)
Glucose, Bld: 100 mg/dL — ABNORMAL HIGH (ref 70–99)
Potassium: 3.6 mEq/L (ref 3.5–5.1)
Sodium: 141 mEq/L (ref 135–145)

## 2021-11-04 LAB — HEMOGLOBIN A1C: Hgb A1c MFr Bld: 6.8 % — ABNORMAL HIGH (ref 4.6–6.5)

## 2021-11-15 ENCOUNTER — Ambulatory Visit
Admission: RE | Admit: 2021-11-15 | Discharge: 2021-11-15 | Disposition: A | Discharge status: Home / Self Care | Payer: Commercial Managed Care - PPO | Source: Ambulatory Visit | Attending: Family Medicine | Admitting: Family Medicine

## 2021-11-15 DIAGNOSIS — Z1231 Encounter for screening mammogram for malignant neoplasm of breast: Secondary | ICD-10-CM

## 2021-12-11 ENCOUNTER — Other Ambulatory Visit: Payer: Self-pay | Admitting: Family Medicine

## 2021-12-12 ENCOUNTER — Other Ambulatory Visit: Payer: Self-pay | Admitting: Family Medicine

## 2021-12-18 ENCOUNTER — Ambulatory Visit (INDEPENDENT_AMBULATORY_CARE_PROVIDER_SITE_OTHER): Payer: Commercial Managed Care - PPO | Admitting: Family Medicine

## 2021-12-18 ENCOUNTER — Encounter: Payer: Self-pay | Admitting: Family Medicine

## 2021-12-18 VITALS — BP 120/80 | HR 95 | Temp 98.5°F | Resp 16 | Wt 156.0 lb

## 2021-12-18 DIAGNOSIS — F4321 Adjustment disorder with depressed mood: Secondary | ICD-10-CM

## 2021-12-18 NOTE — Patient Instructions (Addendum)
Follow up for diabetes in late March or early April Let me know if I can help in any way- talk, meds, whatever Call with any questions or concerns I am SO proud of you! Hang in there!!!

## 2021-12-18 NOTE — Progress Notes (Signed)
° °  Subjective:    Patient ID: Sonia Little, female    DOB: [birth year], 56 y.o.   MRN: [id number]  HPI Adjustment d/o w/ depressed mood- at last visit, pt was meeting w/ Hospice regarding her husband's care.  He passed on 12/01/23.  Pt is in grief counseling w/ Hospice.  Has returned to work after 2 weeks off.  Feels this is a good distraction at the moment.  Feels she has a good support system.  Has some trouble sleeping but is not interested in medication at this time.   Review of Systems For ROS see HPI   This visit occurred during the SARS-CoV-2 public health emergency.  Safety protocols were in place, including screening questions prior to the visit, additional usage of staff PPE, and extensive cleaning of exam room while observing appropriate contact time as indicated for disinfecting solutions.      Objective:   Physical Exam Vitals reviewed.  Constitutional:      General: She is not in acute distress.    Appearance: Normal appearance. She is not ill-appearing.  HENT:     Head: Normocephalic and atraumatic.  Eyes:     Extraocular Movements: Extraocular movements intact.     Conjunctiva/sclera: Conjunctivae normal.     Pupils: Pupils are equal, round, and reactive to light.  Neurological:     General: No focal deficit present.     Mental Status: She is alert and oriented to person, place, and time.  Psychiatric:        Mood and Affect: Mood normal.        Behavior: Behavior normal.        Thought Content: Thought content normal.          Assessment & Plan:

## 2021-12-18 NOTE — Assessment & Plan Note (Signed)
Pt's husband passed on 12/27.  She is grieving appropriately and feels that she has a good support system.  In grief counseling w/ Hospice.  Has returned to work which she finds a good distraction.  She is not interested in medication at this time but I told her to let me know if that changes.  She expressed understanding and thanked me.  Will continue to follow.

## 2022-03-11 ENCOUNTER — Other Ambulatory Visit: Payer: Self-pay | Admitting: Family Medicine

## 2022-03-13 ENCOUNTER — Ambulatory Visit: Payer: Commercial Managed Care - PPO | Admitting: Family Medicine

## 2022-03-13 ENCOUNTER — Encounter: Payer: Self-pay | Admitting: Family Medicine

## 2022-03-13 VITALS — BP 124/72 | HR 86 | Temp 97.2°F | Resp 16 | Ht 60.0 in | Wt 158.1 lb

## 2022-03-13 DIAGNOSIS — R079 Chest pain, unspecified: Secondary | ICD-10-CM

## 2022-03-13 DIAGNOSIS — E119 Type 2 diabetes mellitus without complications: Secondary | ICD-10-CM

## 2022-03-13 LAB — BASIC METABOLIC PANEL
BUN: 10 mg/dL (ref 6–23)
CO2: 31 mEq/L (ref 19–32)
Calcium: 9.5 mg/dL (ref 8.4–10.5)
Chloride: 102 mEq/L (ref 96–112)
Creatinine, Ser: 0.57 mg/dL (ref 0.40–1.20)
GFR: 102.74 mL/min (ref >60.00)
Glucose, Bld: 102 mg/dL — ABNORMAL HIGH (ref 70–99)
Potassium: 4.2 mEq/L (ref 3.5–5.1)
Sodium: 138 mEq/L (ref 135–145)

## 2022-03-13 LAB — MICROALBUMIN / CREATININE URINE RATIO
Creatinine,U: 84.8 mg/dL
Microalb Creat Ratio: 0.8 mg/g (ref 0.0–30.0)
Microalb, Ur: <0.7 mg/dL (ref 0.0–1.9)

## 2022-03-13 LAB — HEMOGLOBIN A1C: Hgb A1c MFr Bld: 7.1 % — ABNORMAL HIGH (ref 4.6–6.5)

## 2022-03-13 MED ORDER — MECLIZINE HCL 25 MG PO TABS
25.0000 mg | ORAL_TABLET | Freq: Three times a day (TID) | ORAL | 0 refills | Status: DC | PRN
Start: 2022-03-13 — End: 2023-10-07

## 2022-03-13 MED ORDER — OMEPRAZOLE 20 MG PO CPDR: 20.0000 mg | DELAYED_RELEASE_CAPSULE | Freq: Every day | ORAL | 3 refills | Status: DC

## 2022-03-13 NOTE — Assessment & Plan Note (Signed)
Chronic problem.  On Metformin XR '500mg'$  daily w/o difficulty.  Hx of good control- last A1C 6.8%.  UTD on eye exam, foot exam.  On both statin and ACE.  Check labs.  Adjust meds prn  ?

## 2022-03-13 NOTE — Progress Notes (Signed)
? ?  Subjective:  ? ? Patient ID: Sonia Little, female    DOB: [birth year], 56 y.o.   MRN: [id number] ? ?HPI ?DM- chronic problem, on Metformin XR '500mg'$  daily.  On statin, ACE.  UTD on foot exam, eye exam.  Last A1C 6.8% ? ?CP- pt reports 'it feels like heartburn but it's not heartburn'.  Doesn't respond to Tums or Rolaids.  This occurs periodically.  Pt describes pain 'as like a weight'.  Substernal.  No radiation of pain.  Denies nausea or diaphoresis.  Pt reports she was able to go to the gym w/o difficulty.  Sxs occur 'randomly' including at rest. ? ? ?Review of Systems ?For ROS see HPI  ?   ?Objective:  ? Physical Exam ?Vitals reviewed.  ?Constitutional:   ?   General: She is not in acute distress. ?   Appearance: Normal appearance. She is well-developed. She is not ill-appearing.  ?HENT:  ?   Head: Normocephalic and atraumatic.  ?Eyes:  ?   Conjunctiva/sclera: Conjunctivae normal.  ?   Pupils: Pupils are equal, round, and reactive to light.  ?Neck:  ?   Thyroid: No thyromegaly.  ?Cardiovascular:  ?   Rate and Rhythm: Normal rate and regular rhythm.  ?   Pulses: Normal pulses.  ?   Heart sounds: Normal heart sounds. No murmur heard. ?Pulmonary:  ?   Effort: Pulmonary effort is normal. No respiratory distress.  ?   Breath sounds: Normal breath sounds.  ?Abdominal:  ?   General: There is no distension.  ?   Palpations: Abdomen is soft.  ?   Tenderness: There is no abdominal tenderness.  ?Musculoskeletal:  ?   Cervical back: Normal range of motion and neck supple.  ?   Right lower leg: No edema.  ?   Left lower leg: No edema.  ?Lymphadenopathy:  ?   Cervical: No cervical adenopathy.  ?Skin: ?   General: Skin is warm and dry.  ?Neurological:  ?   Mental Status: She is alert and oriented to person, place, and time.  ?Psychiatric:     ?   Behavior: Behavior normal.  ? ? ? ? ? ?   ?Assessment & Plan:  ? ?CP- new.  Pt reports this feels like 'stress' or 'acid reflux' but given her hx of DM and hyperlipidemia, must  consider cardiac cause.  Thankfully EKG WNL.  Sxs do not appear when exercising.  Pt states they are random occurrences. Will start low dose PPI and refer to cards for complete evaluation.  Reviewed signs and sxs that should prompt ER attention.  Pt expressed understanding and is in agreement w/ plan.   ?

## 2022-03-13 NOTE — Patient Instructions (Addendum)
Schedule your complete physical in 3-4 months ?We'll notify you of your lab results and make any changes if needed ?Start the Omeprazole daily to decrease acid production ?Continue to work on healthy diet and regular exercise- you can do it! ?IF you have chest pain that doesn't improve, is severe, causes shortness of breath or sweating or nausea, or any other concerns- please go to the ER to be checked out ?We'll call you to schedule your cardiology appt ?Call with any questions or concerns ?Safe [place]!!! ?

## 2022-03-20 ENCOUNTER — Encounter: Payer: Self-pay | Admitting: Cardiovascular Disease

## 2022-03-20 ENCOUNTER — Ambulatory Visit: Payer: Commercial Managed Care - PPO | Admitting: Cardiovascular Disease

## 2022-03-20 VITALS — BP 128/80 | HR 93 | Ht 60.0 in | Wt 162.4 lb

## 2022-03-20 DIAGNOSIS — E7800 Pure hypercholesterolemia, unspecified: Secondary | ICD-10-CM | POA: Diagnosis not present

## 2022-03-20 DIAGNOSIS — E119 Type 2 diabetes mellitus without complications: Secondary | ICD-10-CM

## 2022-03-20 DIAGNOSIS — I10 Essential (primary) hypertension: Secondary | ICD-10-CM | POA: Diagnosis not present

## 2022-03-20 DIAGNOSIS — R079 Chest pain, unspecified: Secondary | ICD-10-CM

## 2022-03-20 DIAGNOSIS — E785 Hyperlipidemia, unspecified: Secondary | ICD-10-CM

## 2022-03-20 NOTE — Progress Notes (Signed)
?Cardiology Office Note:   ? ?Date:  03/23/2022  ? ?ID:  Sonia Little, DOB Jun 18, 1966, MRN [id number] ? ?PCP:  Sonia Minium, MD ?  ?Sonia Little ?Cardiologist:  Sonia Klein, MD    ? ?Referring MD: Sonia Minium, MD  ? ?No chief complaint on file. ?Sonia Little is a 56 y.o. female who is being seen today for the evaluation of atypical chest pain at the request of Tabori, Aundra Millet, MD. ? ? ?History of Present Illness:   ? ?Sonia Little is a 56 y.o. female with a hx of treated hypertension and type 2 diabetes mellitus and hypercholesterolemia, had an episode of chest tightness about 2 weeks ago.  She describes as a sensation of retrosternal pressure that occurred when she was laying down.  She had an episode of nausea and vomiting and the symptoms gradually improved after that.  She has had some recurrence of the nausea and milder variation of the chest tightness since, always at rest.  She exercises regularly.  She will use either a stationary bike or treadmill for 30 minutes 3-4 times a week.  She never has chest tightness during activity.  She also denies orthopnea, PND, leg edema, palpitations, syncope.  She does have occasional dizziness that is worsened by simply turning her head, she does not have to change body position for to occur. ? ?Her blood pressure is well controlled on ramipril.  She has pretty good lipid parameters (LDL close to goal at 102) and glycemic control (hemoglobin A1c close to goal at 7.1%) on atorvastatin and metformin.  She does not smoke.  Her mother has congestive heart failure, but no clear history of CAD.  Multiple family members have diabetes and hypertension, but without clear diagnosis of vascular disease. ? ?Past Medical History:  ?Diagnosis Date  ? CTS (carpal tunnel syndrome)   ? Exophthalmos, unspecified   ? Hypertension   ? Other and unspecified hyperlipidemia   ? Type II or unspecified type diabetes mellitus without mention  of complication, not stated as uncontrolled   ? ? ?Past Surgical History:  ?Procedure Laterality Date  ? ABDOMINAL HYSTERECTOMY  2011  ? For fibroids with pelvic pain   ? TONSILLECTOMY    ? WISDOM TOOTH EXTRACTION    ? ? ?Current Medications: ?Current Meds  ?Medication Sig  ? Ascorbic Acid (VITAMIN C) 250 MG CHEW   ? atorvastatin (LIPITOR) 40 MG tablet TAKE 1 TABLET BY MOUTH EVERY DAY  ? AUVI-Q 0.3 MG/0.3ML SOAJ injection INJECT AS NEEDED FOR SEVERE ALLERGIC REACTION INCLUDING ANAPHYLAXIS AS DIRECTED  ? Barberry-Oreg Grape-Goldenseal 989-211-94 MG CAPS   ? cetirizine (ZYRTEC) 10 MG tablet Take 10 mg by mouth daily.  ? Chromium Picolinate 200 MCG TABS   ? Cinnamon Bark POWD   ? fish oil-omega-3 fatty acids 1000 MG capsule Take 2 g by mouth daily.  ? FLUOCINOLONE ACETONIDE SCALP 0.01 % OIL Pt uses oil on scalp once a week.  ? fluticasone (FLONASE) 50 MCG/ACT nasal spray SPRAY 1 TO 2 SPRAYS INTO EACH NOSTRIL ONCE A DAY  ? ketoconazole (NIZORAL) 2 % shampoo SMARTSIG:5 Milliliter(s) Topical 3 Times a Week  ? meclizine (ANTIVERT) 25 MG tablet Take 1 tablet (25 mg total) by mouth 3 (three) times daily as needed for dizziness.  ? metFORMIN (GLUCOPHAGE-XR) 500 MG 24 hr tablet TAKE 1 TABLET BY MOUTH TWICE A DAY  ? Multiple Vitamin (MULTIVITAMIN) tablet Take 1 tablet by mouth daily. Diabetic combo pak  ?  omeprazole (PRILOSEC) 20 MG capsule Take 1 capsule (20 mg total) by mouth daily.  ? ondansetron (ZOFRAN) 4 MG tablet Take 1 tablet (4 mg total) by mouth every 8 (eight) hours as needed for nausea or vomiting.  ? ramipril (ALTACE) 2.5 MG capsule TAKE 1 CAPSULE BY MOUTH EVERY DAY  ?  ? ?Allergies:   Aspirin and Simvastatin  ? ?Social History  ? ?Socioeconomic History  ? Marital status: Married  ?  Spouse name: Not on file  ? Number of children: Not on file  ? Years of education: Not on file  ? Highest education level: Not on file  ?Occupational History  ? Not on file  ?Tobacco Use  ? Smoking status: Never  ? Smokeless tobacco:  Never  ?Vaping Use  ? Vaping Use: Never used  ?Substance and Sexual Activity  ? Alcohol use: No  ? Drug use: No  ? Sexual activity: Not on file  ?Other Topics Concern  ? Not on file  ?Social History Narrative  ? Not on file  ? ?Social Determinants of Health  ? ?Financial Resource Strain: Not on file  ?Food Insecurity: Not on file  ?Transportation Needs: Not on file  ?Physical Activity: Not on file  ?Stress: Not on file  ?Social Connections: Not on file  ?  ? ?Family History: ?The patient's family history includes Diabetes in her father, maternal aunt, maternal uncle, and mother; Heart failure in her father; Hypertension in her brother, father, mother, and sister. There is no history of Colon cancer or Stomach cancer. ? ?ROS:   ?Please see the history of present illness.    ? All other systems reviewed and are negative. ? ?EKGs/Labs/Other Studies Reviewed:   ? ?The following studies were reviewed today: ?CT of the abdomen and pelvis in 2018 did not show significant atherosclerotic calcifications (study performed for kidney stone) ? ?EKG:  EKG is ordered today.  The ekg ordered today demonstrates normal sinus rhythm and is a completely normal tracing, QTc 422 ms ? ?Recent Labs: ?11/04/2021: ALT 35; Hemoglobin 12.9; Platelets 319.0; TSH 2.67 ?03/13/2022: BUN 10; Creatinine, Ser 0.57; Potassium 4.2; Sodium 138  ?Recent Lipid Panel ?   ?Component Value Date/Time  ? CHOL 170 11/04/2021 0927  ? TRIG 55.0 11/04/2021 0927  ? TRIG 104 10/12/2006 1105  ? HDL 56.60 11/04/2021 0927  ? CHOLHDL 3 11/04/2021 0927  ? VLDL 11.0 11/04/2021 0927  ? LDLCALC 102 (H) 11/04/2021 5176  ? LDLDIRECT 148.9 01/15/2011 0921  ? ? ? ?Risk Assessment/Calculations:   ?  ? ?    ? ?Physical Exam:   ? ?VS:  BP 128/80   Pulse 93   Ht 5' (1.524 m)   Wt 162 lb 6.4 oz (73.7 kg)   SpO2 95%   BMI 31.72 kg/m?    ? ?Wt Readings from Last 3 Encounters:  ?03/20/22 162 lb 6.4 oz (73.7 kg)  ?03/13/22 158 lb 2 oz (71.7 kg)  ?12/18/21 156 lb (70.8 kg)  ?   ? ?GEN: Mildly obese, well nourished, well developed in no acute distress ?HEENT: Normal ?NECK: No JVD; No carotid bruits ?LYMPHATICS: No lymphadenopathy ?CARDIAC: RRR, no murmurs, rubs, gallops ?RESPIRATORY:  Clear to auscultation without rales, wheezing or rhonchi  ?ABDOMEN: Soft, non-tender, non-distended ?MUSCULOSKELETAL:  No edema; No deformity  ?SKIN: Warm and dry ?NEUROLOGIC:  Alert and oriented x 3 ?PSYCHIATRIC:  Normal affect  ? ?ASSESSMENT:   ? ?1. Pure hypercholesterolemia   ?2. Chest pain of uncertain etiology   ?  3. Controlled type 2 diabetes mellitus without complication, without long-term current use of insulin ([place])   ?4. Essential hypertension   ? ?PLAN:   ? ?In order of problems listed above: ? ?Chest pain: This is suggestive of a gastrointestinal etiology (may be acid reflux or esophageal spasm), not a coronary mechanism.  Regardless, I think it is reasonable to perform a treadmill stress test in this patient who is almost 56 years old and has numerous vascular risk factors (diabetes, hyperlipidemia, hypertension).   ?HLP: It may also be helpful to further restratify her with a coronary calcium score.  If a high calcium score is found, target LDL should be dropped to a more aggressive standard of less than 70.  Note that she did not tolerate simvastatin but seems to be okay with atorvastatin. ?DM: Borderline control.  Even with brain hemoglobin A1c less than 7%.  She is on a low-dose of metformin and this can be increased.  If additional medications are considered, would recommend using an SGLT2 inhibitor or GLP-1 agonist, rather than insulin or insulin secretagogues. ?HTN: Well-controlled.  An ACE inhibitor is an excellent choice. ?Mild obesity: Weight loss would help with all the above metabolic abnormalities. ? ?   ? ?Shared Decision Making/Informed Consent ?The risks [chest pain, shortness of breath, cardiac arrhythmias, dizziness, blood pressure fluctuations, myocardial infarction,  stroke/transient ischemic attack, and life-threatening complications (estimated to be 1 in 10,000)], benefits (risk stratification, diagnosing coronary artery disease, treatment guidance) and alternatives of an exercise tol

## 2022-03-20 NOTE — Patient Instructions (Signed)
Medication Instructions:  ?No changes ?*If you need a refill on your cardiac medications before your next appointment, please call your pharmacy* ? ? ?Lab Work: ?None ordered ?If you have labs (blood work) drawn today and your tests are completely normal, you will receive your results only by: ?MyChart Message (if you have MyChart) OR ?A paper copy in the mail ?If you have any lab test that is abnormal or we need to change your treatment, we will call you to review the results. ? ? ?Testing/Procedures: ?Your physician has requested that you have an exercise tolerance test. For further information please visit [email or web address]. Please also follow instruction sheet, as given. This will take place at [address], [address]. ?Do not drink or eat foods with caffeine for 24 hours before the test. (Chocolate, coffee, tea, or energy drinks) ?If you use an inhaler, bring it with you to the test. ?Do not smoke for 4 hours before the test. ?Wear comfortable shoes and clothing. ? ?Dr. Sallyanne Kuster has ordered a CT coronary calcium score.  ? ?Test locations:  ?[facility] ([address] [place], [place] [address]) ?[facility] ([address] [place], [place] [address])  ? ?This is $99 out of pocket. ? ? ?Coronary CalciumScan ?A coronary calcium scan is an imaging test used to look for deposits of calcium and other fatty materials (plaques) in the inner lining of the blood vessels of the heart (coronary arteries). These deposits of calcium and plaques can partly clog and narrow the coronary arteries without producing any symptoms or warning signs. This puts a person at risk for a heart attack. This test can detect these deposits before symptoms develop. ?Tell a health care provider about: ?Any allergies you have. ?All medicines you are taking, including vitamins, herbs, eye drops, creams, and over-the-counter medicines. ?Any problems you or family members have had with anesthetic medicines. ?Any blood  disorders you have. ?Any surgeries you have had. ?Any medical conditions you have. ?Whether you are pregnant or may be pregnant. ?What are the risks? ?Generally, this is a safe procedure. However, problems may occur, including: ?Harm to a pregnant woman and her unborn baby. This test involves the use of radiation. Radiation exposure can be dangerous to a pregnant woman and her unborn baby. If you are pregnant, you generally should not have this procedure done. ?Slight increase in the risk of cancer. This is because of the radiation involved in the test. ?What happens before the procedure? ?No preparation is needed for this procedure. ?What happens during the procedure? ?You will undress and remove any jewelry around your neck or chest. ?You will put on a hospital gown. ?Sticky electrodes will be placed on your chest. The electrodes will be connected to an electrocardiogram (ECG) machine to record a tracing of the electrical activity of your heart. ?A CT scanner will take pictures of your heart. During this time, you will be asked to lie still and hold your breath for 2-3 seconds while a picture of your heart is being taken. ?The procedure may vary among health care providers and hospitals. ?What happens after the procedure? ?You can get dressed. ?You can return to your normal activities. ?It is up to you to get the results of your test. Ask your health care provider, or the department that is doing the test, when your results will be ready. ?Summary ?A coronary calcium scan is an imaging test used to look for deposits of calcium and other fatty materials (plaques) in the inner  lining of the blood vessels of the heart (coronary arteries). ?Generally, this is a safe procedure. Tell your health care provider if you are pregnant or may be pregnant. ?No preparation is needed for this procedure. ?A CT scanner will take pictures of your heart. ?You can return to your normal activities after the scan is done. ?This  information is not intended to replace advice given to you by your health care provider. Make sure you discuss any questions you have with your health care provider. ?Document Released: 05/01/2008 Document Revised: 09/22/2016 Document Reviewed: 09/22/2016 ?Elsevier Interactive Patient Education ? 2017 Elsevier Inc. ? ? ? ?Follow-Up: ?At [facility], you and your health needs are our priority.  As part of our continuing mission to provide you with exceptional heart care, we have created designated Provider Care Teams.  These Care Teams include your primary Cardiologist (physician) and Advanced Practice Providers (APPs -  Physician Assistants and Nurse Practitioners) who all work together to provide you with the care you need, when you need it. ? ?We recommend signing up for the patient portal called "MyChart".  Sign up information is provided on this After Visit Summary.  MyChart is used to connect with patients for Virtual Visits (Telemedicine).  Patients are able to view lab/test results, encounter notes, upcoming appointments, etc.  Non-urgent messages can be sent to your provider as well.   ?To learn more about what you can do with MyChart, go to [email or web address].   ? ?Your next appointment:   ?Follow up as needed with Dr. Sallyanne Kuster ? ?Important Information About Sugar ? ? ? ? ? ? ?

## 2022-03-28 ENCOUNTER — Telehealth (HOSPITAL_COMMUNITY): Payer: Self-pay | Admitting: *Deleted

## 2022-03-28 NOTE — Telephone Encounter (Signed)
Close encounter 

## 2022-04-01 ENCOUNTER — Ambulatory Visit (HOSPITAL_COMMUNITY)
Admission: RE | Admit: 2022-04-01 | Discharge: 2022-04-01 | Disposition: A | Discharge status: Home / Self Care | Payer: Commercial Managed Care - PPO | Source: Ambulatory Visit | Attending: Cardiology | Admitting: Cardiology

## 2022-04-01 DIAGNOSIS — R079 Chest pain, unspecified: Secondary | ICD-10-CM | POA: Insufficient documentation

## 2022-04-02 LAB — EXERCISE TOLERANCE TEST
Angina Index: 0
Base ST Depression (mm): 0 mm
Duke Treadmill Score: 9
Estimated workload: 10.1
Exercise duration (min): 8 min
Exercise duration (sec): 30 s
MPHR: 166 {beats}/min
Peak HR: 164 {beats}/min
Percent HR: 98 %
Rest HR: 96 {beats}/min
ST Depression (mm): 0 mm

## 2022-05-01 ENCOUNTER — Inpatient Hospital Stay: Admission: RE | Admit: 2022-05-01 | Payer: Commercial Managed Care - PPO | Source: Ambulatory Visit

## 2022-05-14 ENCOUNTER — Ambulatory Visit
Admission: RE | Admit: 2022-05-14 | Discharge: 2022-05-14 | Disposition: A | Discharge status: Home / Self Care | Payer: Self-pay | Source: Ambulatory Visit | Attending: Cardiovascular Disease | Admitting: Cardiovascular Disease

## 2022-05-14 DIAGNOSIS — E7800 Pure hypercholesterolemia, unspecified: Secondary | ICD-10-CM

## 2022-05-15 ENCOUNTER — Telehealth: Payer: Self-pay | Admitting: *Deleted

## 2022-05-15 MED ORDER — ATORVASTATIN CALCIUM 80 MG PO TABS: 80.0000 mg | ORAL_TABLET | Freq: Every day | ORAL | 3 refills | Status: DC

## 2022-05-15 NOTE — Telephone Encounter (Signed)
-----   Message from Sanda Klein, MD sent at 05/15/2022 12:12 PM EDT ----- Calcium score is high for age.  Please increase the atorvastatin to 80 mg daily. Ideally would like the LDL cholesterol less than 70

## 2022-05-15 NOTE — Telephone Encounter (Signed)
pt aware of results  New script sent to the pharmacy  

## 2022-06-12 ENCOUNTER — Other Ambulatory Visit: Payer: Self-pay | Admitting: Family Medicine

## 2022-06-13 ENCOUNTER — Other Ambulatory Visit: Payer: Self-pay | Admitting: Family Medicine

## 2022-06-13 ENCOUNTER — Encounter: Payer: Self-pay | Admitting: Family Medicine

## 2022-06-13 ENCOUNTER — Ambulatory Visit: Payer: Commercial Managed Care - PPO | Admitting: Family Medicine

## 2022-06-13 VITALS — BP 127/78 | HR 78 | Temp 98.1°F | Resp 16 | Ht 61.5 in | Wt 162.0 lb

## 2022-06-13 DIAGNOSIS — Z0000 Encounter for general adult medical examination without abnormal findings: Secondary | ICD-10-CM

## 2022-06-13 DIAGNOSIS — E119 Type 2 diabetes mellitus without complications: Secondary | ICD-10-CM | POA: Diagnosis not present

## 2022-06-13 DIAGNOSIS — E1169 Type 2 diabetes mellitus with other specified complication: Secondary | ICD-10-CM | POA: Diagnosis not present

## 2022-06-13 DIAGNOSIS — E785 Hyperlipidemia, unspecified: Secondary | ICD-10-CM

## 2022-06-13 LAB — BASIC METABOLIC PANEL
BUN: 10 mg/dL (ref 6–23)
CO2: 31 mEq/L (ref 19–32)
Calcium: 9.7 mg/dL (ref 8.4–10.5)
Chloride: 102 mEq/L (ref 96–112)
Creatinine, Ser: 0.64 mg/dL (ref 0.40–1.20)
GFR: 99.73 mL/min (ref >60.00)
Glucose, Bld: 131 mg/dL — ABNORMAL HIGH (ref 70–99)
Potassium: 4 mEq/L (ref 3.5–5.1)
Sodium: 139 mEq/L (ref 135–145)

## 2022-06-13 LAB — CBC WITH DIFFERENTIAL/PLATELET
Basophils Absolute: 0 10*3/uL (ref 0.0–0.1)
Basophils Relative: 0.2 % (ref 0.0–3.0)
Eosinophils Absolute: 0 10*3/uL (ref 0.0–0.7)
Eosinophils Relative: 0.8 % (ref 0.0–5.0)
HCT: 37.7 % (ref 36.0–46.0)
Hemoglobin: 12.6 g/dL (ref 12.0–15.0)
Lymphocytes Relative: 48.8 % — ABNORMAL HIGH (ref 12.0–46.0)
Lymphs Abs: 2.1 10*3/uL (ref 0.7–4.0)
MCHC: 33.5 g/dL (ref 30.0–36.0)
MCV: 93.2 fl (ref 78.0–100.0)
Monocytes Absolute: 0.3 10*3/uL (ref 0.1–1.0)
Monocytes Relative: 6.7 % (ref 3.0–12.0)
Neutro Abs: 1.9 10*3/uL (ref 1.4–7.7)
Neutrophils Relative %: 43.5 % (ref 43.0–77.0)
Platelets: 304 10*3/uL (ref 150.0–400.0)
RBC: 4.05 Mil/uL (ref 3.87–5.11)
RDW: 14.2 % (ref 11.5–15.5)
WBC: 4.4 10*3/uL (ref 4.0–10.5)

## 2022-06-13 LAB — LIPID PANEL
Cholesterol: 162 mg/dL (ref 0–200)
HDL: 55.8 mg/dL (ref >39.00)
LDL Cholesterol: 92 mg/dL (ref 0–99)
NonHDL: 106.11
Total CHOL/HDL Ratio: 3
Triglycerides: 73 mg/dL (ref 0.0–149.0)
VLDL: 14.6 mg/dL (ref 0.0–40.0)

## 2022-06-13 LAB — HEPATIC FUNCTION PANEL
ALT: 33 U/L (ref 0–35)
AST: 20 U/L (ref 0–37)
Albumin: 4.3 g/dL (ref 3.5–5.2)
Alkaline Phosphatase: 43 U/L (ref 39–117)
Bilirubin, Direct: 0.2 mg/dL (ref 0.0–0.3)
Total Bilirubin: 0.6 mg/dL (ref 0.2–1.2)
Total Protein: 6.7 g/dL (ref 6.0–8.3)

## 2022-06-13 LAB — HEMOGLOBIN A1C: Hgb A1c MFr Bld: 7.3 % — ABNORMAL HIGH (ref 4.6–6.5)

## 2022-06-13 LAB — TSH: TSH: 2.44 u[IU]/mL (ref 0.35–5.50)

## 2022-06-13 MED ORDER — ATORVASTATIN CALCIUM 40 MG PO TABS
40.0000 mg | ORAL_TABLET | Freq: Every day | ORAL | 1 refills | Status: DC
Start: 2022-06-13 — End: 2023-06-02

## 2022-06-13 NOTE — Progress Notes (Signed)
   Subjective:    Patient ID: Sonia Little, female    DOB: 03/20/1966, 56 y.o.   MRN: [id number]  HPI CPE- UTD on eye exam, foot exam, mammo, colonoscopy  Patient Care Team    Relationship Specialty Notifications Start End  Midge Minium, MD PCP - General Family Medicine  09/15/14   Croitoru, Dani Gobble, MD PCP - Cardiology Cardiology  03/20/22   Delila Pereyra, MD Consulting Physician Gynecology  01/16/15   Francee Piccolo, MD Consulting Physician Podiatry  06/18/15     Health Maintenance  Topic Date Due   INFLUENZA VACCINE  06/17/2022   HEMOGLOBIN A1C  09/12/2022   OPHTHALMOLOGY EXAM  10/28/2022   MAMMOGRAM  11/15/2022   FOOT EXAM  06/14/2023   COLONOSCOPY (Pts 45-107yr Insurance coverage will need to be confirmed)  11/10/2027   TETANUS/TDAP  08/14/2029   COVID-19 Vaccine  Completed   HPV VACCINES  Aged Out   Hepatitis C Screening  Discontinued   HIV Screening  Discontinued   Zoster Vaccines- Shingrix  Discontinued      Review of Systems Patient reports no vision/ hearing changes, adenopathy,fever, weight change,  persistant/recurrent hoarseness , swallowing issues, chest pain, palpitations, edema, persistant/recurrent cough, hemoptysis, dyspnea (rest/exertional/paroxysmal nocturnal), gastrointestinal bleeding (melena, rectal bleeding), abdominal pain, significant heartburn, GU symptoms (dysuria, hematuria, incontinence), Gyn symptoms (abnormal  bleeding, pain),  syncope, focal weakness, memory loss, numbness & tingling, skin/hair/nail changes, abnormal bruising or bleeding, anxiety, or depression.   + diarrhea w/ increased dose of Atorvastatin.  Pt did not tolerate '80mg'$  daily.    Objective:   Physical Exam General Appearance:    Alert, cooperative, no distress, appears stated age  Head:    Normocephalic, without obvious abnormality, atraumatic  Eyes:    PERRL, conjunctiva/corneas clear, EOM's intact both eyes  Ears:    Normal TM's and external ear canals, both ears  Nose:    Nares normal, septum midline, mucosa normal, no drainage    or sinus tenderness  Throat:   Lips, mucosa, and tongue normal; teeth and gums normal  Neck:   Supple, symmetrical, trachea midline, no adenopathy;    Thyroid: no enlargement/tenderness/nodules  Back:     Symmetric, no curvature, ROM normal, no CVA tenderness  Lungs:     Clear to auscultation bilaterally, respirations unlabored  Chest Wall:    No tenderness or deformity   Heart:    Regular rate and rhythm, S1 and S2 normal, no murmur, rub   or gallop  Breast Exam:    Deferred to mammo  Abdomen:     Soft, non-tender, bowel sounds active all four quadrants,    no masses, no organomegaly  Genitalia:    Deferred  Rectal:    Extremities:   Extremities normal, atraumatic, no cyanosis or edema  Pulses:   2+ and symmetric all extremities  Skin:   Skin color, texture, turgor normal, no rashes or lesions  Lymph nodes:   Cervical, supraclavicular, and axillary nodes normal  Neurologic:   CNII-XII intact, normal strength, sensation and reflexes    throughout          Assessment & Plan:

## 2022-06-13 NOTE — Patient Instructions (Signed)
Follow up in 3-4 months to recheck diabetes We'll notify you of your lab results and make any changes if needed Continue to work on healthy diet and regular exercise- you look great! Call with any questions or concerns Stay Safe!  Stay Healthy! ENJOY YOUR VACATION!!!!

## 2022-06-13 NOTE — Assessment & Plan Note (Signed)
Pt's Lipitor was increased to '80mg'$  after an elevated calcium score.  She wasn't able to tolerate the higher dose due to diarrhea and stopped meds entirely.  Will restart at '40mg'$  daily b/c she was able to tolerate w/o difficulty.

## 2022-06-13 NOTE — Assessment & Plan Note (Signed)
Chronic problem.  UTD on eye exam, foot exam, microalbumin.  Tolerating Metformin w/o difficulty.  Check labs.  Adjust meds prn

## 2022-06-13 NOTE — Assessment & Plan Note (Signed)
Pt's PE unchanged from previous.  UTD on immunizations, mammo, colonoscopy.  Check labs.  Anticipatory guidance provided.

## 2022-06-16 NOTE — Progress Notes (Signed)
Pt seen results via  chart

## 2022-07-09 ENCOUNTER — Ambulatory Visit: Payer: Commercial Managed Care - PPO | Admitting: Physician Assistant

## 2022-07-09 ENCOUNTER — Encounter: Payer: Self-pay | Admitting: Physician Assistant

## 2022-07-09 VITALS — BP 140/80 | HR 86 | Temp 97.8°F | Ht 61.5 in | Wt 162.5 lb

## 2022-07-09 DIAGNOSIS — R300 Dysuria: Secondary | ICD-10-CM | POA: Diagnosis not present

## 2022-07-09 LAB — POCT URINALYSIS DIPSTICK
Bilirubin, UA: NEGATIVE
Glucose, UA: NEGATIVE
Ketones, UA: NEGATIVE
Nitrite, UA: NEGATIVE
Protein, UA: NEGATIVE
Spec Grav, UA: 1.01 (ref 1.010–1.025)
Urobilinogen, UA: 0.2 E.U./dL
pH, UA: 7 (ref 5.0–8.0)

## 2022-07-09 MED ORDER — CEPHALEXIN 500 MG PO CAPS: 500.0000 mg | ORAL_CAPSULE | Freq: Two times a day (BID) | ORAL | 0 refills | Status: DC

## 2022-07-09 NOTE — Progress Notes (Signed)
Sonia Little is a 56 y.o. female here for a follow up of a pre-existing problem.  History of Present Illness:   Chief Complaint  Patient presents with   Dysuria    Pt c/o painful urination, started a week ago, low back pain. Denies fever or chills.   HPI  UTI No prior UTIs. Had recent URI from grandbaby. Has been feeling unwell for a while. Having pressure on her bladder. Low back pain. Could be better about drinking water. No fever, chills, change in appetite, diarrhea. Did recently travel to [place].    Past Medical History:  Diagnosis Date   CTS (carpal tunnel syndrome)    Exophthalmos, unspecified    Hypertension    Other and unspecified hyperlipidemia    Type II or unspecified type diabetes mellitus without mention of complication, not stated as uncontrolled      Social History   Tobacco Use   Smoking status: Never   Smokeless tobacco: Never  Vaping Use   Vaping Use: Never used  Substance Use Topics   Alcohol use: No   Drug use: No    Past Surgical History:  Procedure Laterality Date   ABDOMINAL HYSTERECTOMY  2011   For fibroids with pelvic pain    TONSILLECTOMY     WISDOM TOOTH EXTRACTION      Family History  Problem Relation Age of Onset   Hypertension Father    Diabetes Father    Heart failure Father    Diabetes Mother    Hypertension Mother    Hypertension Sister    Hypertension Brother    Diabetes Maternal Aunt    Diabetes Maternal Uncle    Colon cancer Neg Hx    Stomach cancer Neg Hx     Allergies  Allergen Reactions   Aspirin Nausea And Vomiting    REACTION: nasal obstruction   Simvastatin     REACTION: reflux    Current Medications:   Current Outpatient Medications:    albuterol (VENTOLIN HFA) 108 (90 Base) MCG/ACT inhaler, 1-2 puff as needed, Disp: , Rfl:    Ascorbic Acid (VITAMIN C) 250 MG CHEW, , Disp: , Rfl:    atorvastatin (LIPITOR) 40 MG tablet, Take 1 tablet (40 mg total) by mouth daily., Disp: 90 tablet, Rfl:  1   AUVI-Q 0.3 MG/0.3ML SOAJ injection, INJECT AS NEEDED FOR SEVERE ALLERGIC REACTION INCLUDING ANAPHYLAXIS AS DIRECTED, Disp: , Rfl: 1   Barberry-Oreg Grape-Goldenseal 200-200-50 MG CAPS, , Disp: , Rfl:    cetirizine (ZYRTEC) 10 MG tablet, Take 10 mg by mouth daily., Disp: , Rfl:    Chromium Picolinate 200 MCG TABS, , Disp: , Rfl:    Cinnamon Bark POWD, , Disp: , Rfl:    finasteride (PROSCAR) 5 MG tablet, Take 2.5 mg by mouth daily., Disp: , Rfl:    fish oil-omega-3 fatty acids 1000 MG capsule, Take 2 g by mouth daily., Disp: , Rfl:    FLUOCINOLONE ACETONIDE SCALP 0.01 % OIL, Pt uses oil on scalp once a week., Disp: , Rfl:    fluticasone (FLONASE) 50 MCG/ACT nasal spray, SPRAY 1 TO 2 SPRAYS INTO EACH NOSTRIL ONCE A DAY, Disp: , Rfl:    ketoconazole (NIZORAL) 2 % shampoo, SMARTSIG:5 Milliliter(s) Topical 3 Times a Week, Disp: , Rfl:    meclizine (ANTIVERT) 25 MG tablet, Take 1 tablet (25 mg total) by mouth 3 (three) times daily as needed for dizziness., Disp: 45 tablet, Rfl: 0   metFORMIN (GLUCOPHAGE-XR) 500 MG 24 hr tablet, TAKE  1 TABLET BY MOUTH TWICE A DAY, Disp: 180 tablet, Rfl: 0   Multiple Vitamin (MULTIVITAMIN) tablet, Take 1 tablet by mouth daily. Diabetic combo pak, Disp: , Rfl:    omeprazole (PRILOSEC) 20 MG capsule, TAKE 1 CAPSULE BY MOUTH EVERY DAY, Disp: 90 capsule, Rfl: 1   ondansetron (ZOFRAN) 4 MG tablet, Take 1 tablet (4 mg total) by mouth every 8 (eight) hours as needed for nausea or vomiting., Disp: 20 tablet, Rfl: 0   ramipril (ALTACE) 2.5 MG capsule, TAKE 1 CAPSULE BY MOUTH EVERY DAY, Disp: 90 capsule, Rfl: 0   Review of Systems:   ROS Negative unless otherwise specified per HPI.  Vitals:   Vitals:   07/09/22 1058  BP: (!) 140/80  Pulse: 86  Temp: 97.8 F (36.6 C)  TempSrc: Temporal  SpO2: 98%  Weight: 162 lb 8 oz (73.7 kg)  Height: 5' 1.5" (1.562 m)     Body mass index is 30.21 kg/m.  Physical Exam:   Physical Exam Vitals and nursing note reviewed.   Constitutional:      General: She is not in acute distress.    Appearance: She is well-developed. She is not ill-appearing or toxic-appearing.  Cardiovascular:     Rate and Rhythm: Normal rate and regular rhythm.     Pulses: Normal pulses.     Heart sounds: Normal heart sounds, S1 normal and S2 normal.  Pulmonary:     Effort: Pulmonary effort is normal.     Breath sounds: Normal breath sounds.  Abdominal:     Tenderness: There is no right CVA tenderness or left CVA tenderness.  Skin:    General: Skin is warm and dry.  Neurological:     Mental Status: She is alert.     GCS: GCS eye subscore is 4. GCS verbal subscore is 5. GCS motor subscore is 6.  Psychiatric:        Speech: Speech normal.        Behavior: Behavior normal. Behavior is cooperative.    Results for orders placed or performed in visit on 07/09/22  POCT urinalysis dipstick  Result Value Ref Range   Color, UA yellow    Clarity, UA cloudy    Glucose, UA Negative Negative   Bilirubin, UA Negative    Ketones, UA Negative    Spec Grav, UA 1.010 1.010 - 1.025   Blood, UA Trace    pH, UA 7.0 5.0 - 8.0   Protein, UA Negative Negative   Urobilinogen, UA 0.2 0.2 or 1.0 E.U./dL   Nitrite, UA Negative    Leukocytes, UA Moderate (2+) (A) Negative   Appearance     Odor      Assessment and Plan:   Dysuria No red flags on exam Will empirically start keflex 500 mg BID Will order culture for further evaluation and management  Inda Coke, PA-C

## 2022-07-09 NOTE — Patient Instructions (Signed)
It was great to see you!  Start keflex 500 mg twice daily  General instructions Make sure you: Pee until your bladder is empty. Do not hold pee for a long time. Empty your bladder after sex. Wipe from front to back after pooping if you are a female. Use each tissue one time when you wipe. Drink enough fluid to keep your pee pale yellow. Keep all follow-up visits as told by your doctor. This is important. Contact a doctor if: You do not get better after 1-2 days. Your symptoms go away and then come back. Get help right away if: You have very bad back pain. You have very bad pain in your lower belly. You have a fever. You are sick to your stomach (nauseous). You are throwing up.   I will be in touch with your urine culture results.  Take care,  Inda Coke PA-C

## 2022-07-10 ENCOUNTER — Ambulatory Visit: Payer: Commercial Managed Care - PPO | Admitting: Physician Assistant

## 2022-09-07 ENCOUNTER — Other Ambulatory Visit: Payer: Self-pay | Admitting: Family Medicine

## 2022-09-10 ENCOUNTER — Ambulatory Visit: Payer: Commercial Managed Care - PPO | Admitting: Family Medicine

## 2022-09-10 ENCOUNTER — Encounter: Payer: Self-pay | Admitting: Family Medicine

## 2022-09-10 VITALS — BP 120/70 | HR 84 | Temp 98.0°F | Resp 16 | Ht 61.5 in | Wt 163.2 lb

## 2022-09-10 DIAGNOSIS — E119 Type 2 diabetes mellitus without complications: Secondary | ICD-10-CM | POA: Diagnosis not present

## 2022-09-10 LAB — BASIC METABOLIC PANEL
BUN: 12 mg/dL (ref 6–23)
CO2: 31 mEq/L (ref 19–32)
Calcium: 9.8 mg/dL (ref 8.4–10.5)
Chloride: 102 mEq/L (ref 96–112)
Creatinine, Ser: 0.57 mg/dL (ref 0.40–1.20)
GFR: 102.38 mL/min (ref >60.00)
Glucose, Bld: 125 mg/dL — ABNORMAL HIGH (ref 70–99)
Potassium: 4.2 mEq/L (ref 3.5–5.1)
Sodium: 139 mEq/L (ref 135–145)

## 2022-09-10 LAB — HEMOGLOBIN A1C: Hgb A1c MFr Bld: 7.3 % — ABNORMAL HIGH (ref 4.6–6.5)

## 2022-09-10 NOTE — Patient Instructions (Signed)
Follow up in 3-4 months to recheck sugar, BP, and cholesterol We'll notify you of your lab results and make any changes if needed Keep up the good work on healthy diet and regular exercise- you look great! Call with any questions or concerns Stay Safe!  Stay Healthy! Hang in there!  You're doing great!!!

## 2022-09-10 NOTE — Assessment & Plan Note (Signed)
Chronic problem.  On Metformin XR '500mg'$  BID w/o difficulty.  She reports sugars have been running high for her- but overall they have not been that high.  She had 2 episodes in October, 1 in September, and 1 in August that were above 200.  Encouraged her to be kind to herself as she is still processing her husband's death.  Check labs.  Adjust meds prn

## 2022-09-10 NOTE — Progress Notes (Signed)
   Subjective:    Patient ID: Sonia Little, female    DOB: 10-19-1966, 56 y.o.   MRN: [id number]  HPI DM- chronic problem, on Metformin XR '500mg'$  BID.  UTD on eye exam, foot exam, microalbumin.  Also on Ramipril.  Pt reports sugars have been running high but she realized she wasn't drinking enough water.  Has had rare readings between 200-250.  Reports she is emotional eating since losing her husband.  Last A1C 7.3%   Review of Systems For ROS see HPI     Objective:   Physical Exam Vitals reviewed.  Constitutional:      General: She is not in acute distress.    Appearance: Normal appearance. She is well-developed. She is not ill-appearing.  HENT:     Head: Normocephalic and atraumatic.  Eyes:     Conjunctiva/sclera: Conjunctivae normal.     Pupils: Pupils are equal, round, and reactive to light.  Neck:     Thyroid: Thyromegaly present.  Cardiovascular:     Rate and Rhythm: Normal rate and regular rhythm.     Heart sounds: Normal heart sounds. No murmur heard. Pulmonary:     Effort: Pulmonary effort is normal. No respiratory distress.     Breath sounds: Normal breath sounds.  Abdominal:     General: There is no distension.     Palpations: Abdomen is soft.     Tenderness: There is no abdominal tenderness.  Musculoskeletal:     Cervical back: Normal range of motion and neck supple.  Lymphadenopathy:     Cervical: No cervical adenopathy.  Skin:    General: Skin is warm and dry.  Neurological:     Mental Status: She is alert and oriented to person, place, and time.  Psychiatric:        Behavior: Behavior normal.           Assessment & Plan:

## 2022-09-11 NOTE — Progress Notes (Signed)
Pt seen results Via my chart  

## 2022-09-17 ENCOUNTER — Other Ambulatory Visit: Payer: Self-pay | Admitting: Family Medicine

## 2022-09-17 DIAGNOSIS — Z1231 Encounter for screening mammogram for malignant neoplasm of breast: Secondary | ICD-10-CM

## 2022-11-05 LAB — HM DIABETES EYE EXAM

## 2022-11-07 ENCOUNTER — Encounter: Payer: Self-pay | Admitting: Family Medicine

## 2022-11-18 ENCOUNTER — Ambulatory Visit
Admission: RE | Admit: 2022-11-18 | Discharge: 2022-11-18 | Disposition: A | Discharge status: Home / Self Care | Payer: Commercial Managed Care - PPO | Source: Ambulatory Visit | Attending: Family Medicine | Admitting: Family Medicine

## 2022-11-18 DIAGNOSIS — Z1231 Encounter for screening mammogram for malignant neoplasm of breast: Secondary | ICD-10-CM

## 2022-12-01 ENCOUNTER — Other Ambulatory Visit: Payer: Self-pay | Admitting: Family Medicine

## 2022-12-15 ENCOUNTER — Ambulatory Visit: Payer: Commercial Managed Care - PPO | Admitting: Orthopaedic Surgery

## 2022-12-15 ENCOUNTER — Ambulatory Visit (INDEPENDENT_AMBULATORY_CARE_PROVIDER_SITE_OTHER): Payer: Commercial Managed Care - PPO | Admitting: Sports Medicine

## 2022-12-15 ENCOUNTER — Ambulatory Visit: Payer: Self-pay

## 2022-12-15 DIAGNOSIS — G8929 Other chronic pain: Secondary | ICD-10-CM

## 2022-12-15 DIAGNOSIS — M25512 Pain in left shoulder: Secondary | ICD-10-CM | POA: Diagnosis not present

## 2022-12-15 DIAGNOSIS — M7542 Impingement syndrome of left shoulder: Secondary | ICD-10-CM

## 2022-12-15 MED ORDER — LIDOCAINE HCL 1 % IJ SOLN
2.0000 mL | INTRAMUSCULAR | Status: AC | PRN
  Administered 2022-12-15: 2 mL

## 2022-12-15 MED ORDER — BUPIVACAINE HCL 0.25 % IJ SOLN
2.0000 mL | INTRAMUSCULAR | Status: AC | PRN
  Administered 2022-12-15: 2 mL via INTRA_ARTICULAR

## 2022-12-15 MED ORDER — METHYLPREDNISOLONE ACETATE 40 MG/ML IJ SUSP
40.0000 mg | INTRAMUSCULAR | Status: AC | PRN
  Administered 2022-12-15: 40 mg via INTRA_ARTICULAR

## 2022-12-15 NOTE — Progress Notes (Signed)
   Procedure Note  Patient: Sonia Little             Date of Birth: Apr 26, 1966           MRN: [id number]             Visit Date: 12/15/2022  Procedures: Visit Diagnoses:  1. Chronic left shoulder pain    Large Joint Inj: L glenohumeral on 12/15/2022 9:30 AM Indications: pain Details: 22 G 3.5 in needle, ultrasound-guided posterior approach Medications: 2 mL lidocaine 1 %; 2 mL bupivacaine 0.25 %; 40 mg methylPREDNISolone acetate 40 MG/ML Outcome: tolerated well, no immediate complications  US-guided glenohumeral joint injection, left shoulder After discussion on risks/benefits/indications, informed verbal consent was obtained. A timeout was then performed. The patient was positioned lying lateral recumbent on examination table. The patient's shoulder was prepped with betadine and multiple alcohol swabs and utilizing ultrasound guidance, the patient's glenohumeral joint was identified on ultrasound. Using ultrasound guidance a 22-gauge, 3.5 inch needle with a mixture of 2:2:1 cc's lidocaine:bupivicaine:depomedrol was directed from a lateral to medial direction via in-plane technique into the glenohumeral joint with visualization of appropriate spread of injectate into the joint. Patient tolerated the procedure well without immediate complications.      Procedure, treatment alternatives, risks and benefits explained, specific risks discussed. Consent was given by the patient. Immediately prior to procedure a time out was called to verify the correct patient, procedure, equipment, support staff and site/side marked as required. Patient was prepped and draped in the usual sterile fashion.     - I evaluated the patient about 10 minutes post-injection and she had good improvement in range of motion, some improvement in pain - follow-up with Dr. Ninfa Linden as indicated; I am happy to see them as needed  Elba Barman, DO [place]  This note was dictated using Dragon naturally speaking software and may contain errors in syntax, spelling, or content which have not been identified prior to signing this note.

## 2022-12-15 NOTE — Progress Notes (Signed)
The patient is a very pleasant and active 57 year old female that we last saw in November 2022 secondary to left shoulder pain.  She had signs of impingement of that shoulder.  Eventually MRI showed some mild insertional tendinosis of the subscapularis tendon.  There was no evidence of tear or muscle atrophy.  There is some tendinosis of the intra-articular portion of the long head of the biceps as well as some mild subacromial and subdeltoid bursitis.  She also had some evidence adhesive capsulitis with some edema in the inferior glenohumeral ligament area.  A subacromial steroid injection did not help but Dr. Ernestina Patches was able to provide an intra-articular injection in the left shoulder joint under fluoroscopy and she said that helped her greatly.  Its only been hurting again for just a few months.  She denies any injury to the left shoulder.  She reports just slight decrease in motion.  She is requesting another intra-articular injection since that is helped her the most.  She has had no active change in her medical status.  She is a type II diabetic.  Her hemoglobin A1c 3 months ago was 7.3.  Examination of her left shoulder shows no blocks to rotation but there is just some slight stiffness and pain in that shoulder.  This seems to be more deep than subacromial.  I did not x-ray the shoulder today.  I do not feel that there was significant weakness in the cuff at all.  I would like to send her to Dr. Rolena Infante my partner for providing another intra-articular injection in her left glenohumeral joint of the shoulder since that helped her so much in the past.  It has been well over a year since she has had an injection such as that and I think this would help her quite a bit as does she.  We will see if Dr. Rolena Infante can see her soon and then follow-up can be as needed and less this does not help or things seem to worsen for her.  All question concerns were answered and addressed.

## 2022-12-24 ENCOUNTER — Ambulatory Visit: Payer: Commercial Managed Care - PPO | Admitting: Family Medicine

## 2022-12-24 ENCOUNTER — Encounter: Payer: Self-pay | Admitting: Family Medicine

## 2022-12-24 VITALS — BP 122/80 | HR 85 | Temp 99.0°F | Resp 17 | Ht 61.5 in | Wt 160.0 lb

## 2022-12-24 DIAGNOSIS — I10 Essential (primary) hypertension: Secondary | ICD-10-CM | POA: Diagnosis not present

## 2022-12-24 DIAGNOSIS — E119 Type 2 diabetes mellitus without complications: Secondary | ICD-10-CM

## 2022-12-24 DIAGNOSIS — E1169 Type 2 diabetes mellitus with other specified complication: Secondary | ICD-10-CM

## 2022-12-24 DIAGNOSIS — E785 Hyperlipidemia, unspecified: Secondary | ICD-10-CM | POA: Diagnosis not present

## 2022-12-24 LAB — CBC WITH DIFFERENTIAL/PLATELET
Basophils Absolute: 0 10*3/uL (ref 0.0–0.1)
Basophils Relative: 0.4 % (ref 0.0–3.0)
Eosinophils Absolute: 0 10*3/uL (ref 0.0–0.7)
Eosinophils Relative: 0.5 % (ref 0.0–5.0)
HCT: 40.6 % (ref 36.0–46.0)
Hemoglobin: 13.6 g/dL (ref 12.0–15.0)
Lymphocytes Relative: 41.1 % (ref 12.0–46.0)
Lymphs Abs: 2.1 10*3/uL (ref 0.7–4.0)
MCHC: 33.4 g/dL (ref 30.0–36.0)
MCV: 92.3 fl (ref 78.0–100.0)
Monocytes Absolute: 0.5 10*3/uL (ref 0.1–1.0)
Monocytes Relative: 8.7 % (ref 3.0–12.0)
Neutro Abs: 2.5 10*3/uL (ref 1.4–7.7)
Neutrophils Relative %: 49.3 % (ref 43.0–77.0)
Platelets: 285 10*3/uL (ref 150.0–400.0)
RBC: 4.4 Mil/uL (ref 3.87–5.11)
RDW: 14.4 % (ref 11.5–15.5)
WBC: 5.2 10*3/uL (ref 4.0–10.5)

## 2022-12-24 LAB — BASIC METABOLIC PANEL
BUN: 9 mg/dL (ref 6–23)
CO2: 30 mEq/L (ref 19–32)
Calcium: 9.5 mg/dL (ref 8.4–10.5)
Chloride: 100 mEq/L (ref 96–112)
Creatinine, Ser: 0.61 mg/dL (ref 0.40–1.20)
GFR: 100.52 mL/min (ref >60.00)
Glucose, Bld: 116 mg/dL — ABNORMAL HIGH (ref 70–99)
Potassium: 4.3 mEq/L (ref 3.5–5.1)
Sodium: 139 mEq/L (ref 135–145)

## 2022-12-24 LAB — HEPATIC FUNCTION PANEL
ALT: 23 U/L (ref 0–35)
AST: 16 U/L (ref 0–37)
Albumin: 4.7 g/dL (ref 3.5–5.2)
Alkaline Phosphatase: 56 U/L (ref 39–117)
Bilirubin, Direct: 0.2 mg/dL (ref 0.0–0.3)
Total Bilirubin: 0.8 mg/dL (ref 0.2–1.2)
Total Protein: 7.4 g/dL (ref 6.0–8.3)

## 2022-12-24 LAB — TSH: TSH: 3.2 u[IU]/mL (ref 0.35–5.50)

## 2022-12-24 LAB — LIPID PANEL
Cholesterol: 178 mg/dL (ref 0–200)
HDL: 56.1 mg/dL (ref >39.00)
LDL Cholesterol: 105 mg/dL — ABNORMAL HIGH (ref 0–99)
NonHDL: 122.22
Total CHOL/HDL Ratio: 3
Triglycerides: 85 mg/dL (ref 0.0–149.0)
VLDL: 17 mg/dL (ref 0.0–40.0)

## 2022-12-24 LAB — HEMOGLOBIN A1C: Hgb A1c MFr Bld: 7.5 % — ABNORMAL HIGH (ref 4.6–6.5)

## 2022-12-24 NOTE — Assessment & Plan Note (Signed)
Chronic problem.  Currently on Metformin XR '500mg'$  BID w/o difficulty.  Home CBGs are typically within the green range w/ only a few outliers.  UTD on eye exam, foot exam, microalbumin.  Asymptomatic at this time but does note occasional tingling of feet.  Check labs.  Adjust meds prn

## 2022-12-24 NOTE — Progress Notes (Signed)
   Subjective:    Patient ID: Sonia Little, female    DOB: Apr 24, 1966, 57 y.o.   MRN: [id number]  HPI DM- chronic problem, on Metformin XR '500mg'$  BID.  UTD on eye exam, foot exam, microalbumin.  Pt reports feeling 'good'.  No symptomatic lows.  Rare tingling of feet.  Hyperlipidemia- chronic problem, on Lipitor '40mg'$  daily.  No abd pain, N/V.  HTN- chronic problem, on Ramipril 2.'5mg'$  daily.  No CP, SOB, HA's, visual changes, edema.   Review of Systems For ROS see HPI     Objective:   Physical Exam Vitals reviewed.  Constitutional:      General: She is not in acute distress.    Appearance: Normal appearance. She is well-developed. She is not ill-appearing.  HENT:     Head: Normocephalic and atraumatic.  Eyes:     Conjunctiva/sclera: Conjunctivae normal.     Pupils: Pupils are equal, round, and reactive to light.  Neck:     Thyroid: No thyromegaly.  Cardiovascular:     Rate and Rhythm: Normal rate and regular rhythm.     Pulses: Normal pulses.     Heart sounds: Normal heart sounds. No murmur heard. Pulmonary:     Effort: Pulmonary effort is normal. No respiratory distress.     Breath sounds: Normal breath sounds.  Abdominal:     General: There is no distension.     Palpations: Abdomen is soft.     Tenderness: There is no abdominal tenderness.  Musculoskeletal:     Cervical back: Normal range of motion and neck supple.     Right lower leg: No edema.     Left lower leg: No edema.  Lymphadenopathy:     Cervical: No cervical adenopathy.  Skin:    General: Skin is warm and dry.  Neurological:     General: No focal deficit present.     Mental Status: She is alert and oriented to person, place, and time.  Psychiatric:        Mood and Affect: Mood normal.        Behavior: Behavior normal.           Assessment & Plan:

## 2022-12-24 NOTE — Patient Instructions (Signed)
Follow up in 3-4 months to recheck diabetes We'll notify you of your lab results and make any changes if needed Continue to work on healthy diet and regular exercise- you're doing great! Call with any questions or concerns Stay Safe!  Stay Healthy! You can get through February!!!

## 2022-12-24 NOTE — Assessment & Plan Note (Signed)
Chronic problem.  On Ramipril 2.'5mg'$  daily w/o difficulty.  Currently asymptomatic.  Check labs due to ACE use but no anticipated med changes.

## 2022-12-24 NOTE — Assessment & Plan Note (Signed)
Chronic problem.  Currently on Lipitor 40mg daily w/o difficulty.  Check labs.  Adjust meds prn  

## 2022-12-25 ENCOUNTER — Telehealth: Payer: Self-pay

## 2022-12-25 NOTE — Telephone Encounter (Signed)
-----   Message from Midge Minium, MD sent at 12/25/2022  7:39 AM EST ----- Labs are stable and look good!  A1C is at 7.5%  This will improve w/ low carb, low sugar diet and regular exercise.  No med changes at this time

## 2022-12-25 NOTE — Telephone Encounter (Signed)
Left pt a VM to call the office in regards to lab results

## 2022-12-26 NOTE — Telephone Encounter (Signed)
Called and informed the patient no concerns at this time

## 2023-01-10 ENCOUNTER — Encounter (HOSPITAL_COMMUNITY): Payer: Self-pay | Admitting: *Deleted

## 2023-01-10 ENCOUNTER — Ambulatory Visit (HOSPITAL_COMMUNITY)
Admission: EM | Admit: 2023-01-10 | Discharge: 2023-01-10 | Disposition: A | Discharge status: Home / Self Care | Payer: Commercial Managed Care - PPO | Attending: Family Medicine | Admitting: Family Medicine

## 2023-01-10 ENCOUNTER — Other Ambulatory Visit: Payer: Self-pay

## 2023-01-10 DIAGNOSIS — U071 COVID-19: Secondary | ICD-10-CM

## 2023-01-10 MED ORDER — PAXLOVID (300/100) 20 X 150 MG & 10 X 100MG PO TBPK: 3.0000 | ORAL_TABLET | Freq: Two times a day (BID) | ORAL | 0 refills | Status: AC

## 2023-01-10 MED ORDER — PROMETHAZINE-DM 6.25-15 MG/5ML PO SYRP: 5.0000 mL | ORAL_SOLUTION | Freq: Four times a day (QID) | ORAL | 0 refills | Status: DC | PRN

## 2023-01-10 NOTE — ED Triage Notes (Signed)
Pt reports she has had a fever and felt fatigued since Thursday. . Pt went to [facility] to be tested today .

## 2023-01-12 NOTE — ED Provider Notes (Signed)
[name]   [id number] 01/10/23 Arrival Time: [id number]  ASSESSMENT & PLAN:  1. COVID-19 virus infection    No resp distress. Discussed typical duration of viral illnesses. OTC symptom care as needed.  GFR 100 12/24/22.  Discharge Medication List as of 01/10/2023  6:24 PM     START taking these medications   Details  nirmatrelvir & ritonavir (PAXLOVID, 300/100,) 20 x 150 MG & 10 x '100MG'$  TBPK Take 3 tablets by mouth 2 (two) times daily for 5 days., Starting Sat 01/10/2023, Until Thu 01/15/2023, Normal    promethazine-dextromethorphan (PROMETHAZINE-DM) 6.25-15 MG/5ML syrup Take 5 mLs by mouth 4 (four) times daily as needed for cough., Starting Sat 01/10/2023, Normal         Follow-up Information     Sonia Minium, MD.   Specialty: Family Medicine Why: If worsening or failing to improve as anticipated. Contact information: [address] [place] [address] [phone]                 Reviewed expectations re: course of current medical issues. Questions answered. Outlined signs and symptoms indicating need for more acute intervention. Understanding verbalized. After Visit Summary given.   SUBJECTIVE: History from: Patient. Sonia Little is a 57 y.o. female. Pt reports she has had a fever and felt fatigued since Thursday. . Pt went to [facility] to be tested today . Positive COVID test. Denies resp difficulties. Normal PO intake without n/v/d.  OBJECTIVE:  Vitals:   01/10/23 1816  BP: 125/82  Pulse: (!) 125  Resp: 18  Temp: 99.8 F (37.7 C)  SpO2: 95%    Recheck P: 112 and regular. (Reports that she has not been hydrating well; denies CP)  General appearance: alert; no distress Eyes: PERRLA; EOMI; conjunctiva normal HENT: [place]; AT; with nasal congestion Neck: supple  Lungs: speaks full sentences without difficulty; unlabored; CTAB Extremities: no edema Skin: warm and dry Neurologic: normal gait Psychological: alert and  cooperative; normal mood and affect  Labs Reviewed: Results for orders placed or performed in visit on 0000000  Basic metabolic panel  Result Value Ref Range   Sodium 139 135 - 145 mEq/L   Potassium 4.3 3.5 - 5.1 mEq/L   Chloride 100 96 - 112 mEq/L   CO2 30 19 - 32 mEq/L   Glucose, Bld 116 (H) 70 - 99 mg/dL   BUN 9 6 - 23 mg/dL   Creatinine, Ser 0.61 0.40 - 1.20 mg/dL   GFR 100.52 >60.00 mL/min   Calcium 9.5 8.4 - 10.5 mg/dL  TSH  Result Value Ref Range   TSH 3.20 0.35 - 5.50 uIU/mL  Hepatic function panel  Result Value Ref Range   Total Bilirubin 0.8 0.2 - 1.2 mg/dL   Bilirubin, Direct 0.2 0.0 - 0.3 mg/dL   Alkaline Phosphatase 56 39 - 117 U/L   AST 16 0 - 37 U/L   ALT 23 0 - 35 U/L   Total Protein 7.4 6.0 - 8.3 g/dL   Albumin 4.7 3.5 - 5.2 g/dL  CBC with Differential/Platelet  Result Value Ref Range   WBC 5.2 4.0 - 10.5 K/uL   RBC 4.40 3.87 - 5.11 Mil/uL   Hemoglobin 13.6 12.0 - 15.0 g/dL   HCT 40.6 36.0 - 46.0 %   MCV 92.3 78.0 - 100.0 fl   MCHC 33.4 30.0 - 36.0 g/dL   RDW 14.4 11.5 - 15.5 %   Platelets 285.0 150.0 - 400.0 K/uL   Neutrophils  Relative % 49.3 43.0 - 77.0 %   Lymphocytes Relative 41.1 12.0 - 46.0 %   Monocytes Relative 8.7 3.0 - 12.0 %   Eosinophils Relative 0.5 0.0 - 5.0 %   Basophils Relative 0.4 0.0 - 3.0 %   Neutro Abs 2.5 1.4 - 7.7 K/uL   Lymphs Abs 2.1 0.7 - 4.0 K/uL   Monocytes Absolute 0.5 0.1 - 1.0 K/uL   Eosinophils Absolute 0.0 0.0 - 0.7 K/uL   Basophils Absolute 0.0 0.0 - 0.1 K/uL  Hemoglobin A1c  Result Value Ref Range   Hgb A1c MFr Bld 7.5 (H) 4.6 - 6.5 %  Lipid panel  Result Value Ref Range   Cholesterol 178 0 - 200 mg/dL   Triglycerides 85.0 0.0 - 149.0 mg/dL   HDL 56.10 >39.00 mg/dL   VLDL 17.0 0.0 - 40.0 mg/dL   LDL Cholesterol 105 (H) 0 - 99 mg/dL   Total CHOL/HDL Ratio 3    NonHDL 122.22    Labs Reviewed - No data to display  Imaging: No results found.  Allergies  Allergen Reactions   Aspirin Nausea And Vomiting     REACTION: nasal obstruction   Simvastatin     REACTION: reflux    Past Medical History:  Diagnosis Date   CTS (carpal tunnel syndrome)    Exophthalmos, unspecified    Hypertension    Other and unspecified hyperlipidemia    Type II or unspecified type diabetes mellitus without mention of complication, not stated as uncontrolled    Social History   Socioeconomic History   Marital status: Widowed    Spouse name: Not on file   Number of children: Not on file   Years of education: Not on file   Highest education level: Not on file  Occupational History   Not on file  Tobacco Use   Smoking status: Never   Smokeless tobacco: Never  Vaping Use   Vaping Use: Never used  Substance and Sexual Activity   Alcohol use: No   Drug use: No   Sexual activity: Not on file  Other Topics Concern   Not on file  Social History Narrative   Not on file   Social Determinants of Health   Financial Resource Strain: Not on file  Food Insecurity: Not on file  Transportation Needs: Not on file  Physical Activity: Not on file  Stress: Not on file  Social Connections: Not on file  Intimate Partner Violence: Not on file   Family History  Problem Relation Age of Onset   Diabetes Mother    Hypertension Mother    Hypertension Father    Diabetes Father    Heart failure Father    Hypertension Sister    Diabetes Maternal Aunt    Diabetes Maternal Uncle    Hypertension Brother    Colon cancer Neg Hx    Stomach cancer Neg Hx    Breast cancer Neg Hx    Past Surgical History:  Procedure Laterality Date   ABDOMINAL HYSTERECTOMY  2011   For fibroids with pelvic pain    TONSILLECTOMY     WISDOM TOOTH EXTRACTION       Vanessa Kick, MD 01/12/23 1242

## 2023-03-01 ENCOUNTER — Other Ambulatory Visit: Payer: Self-pay | Admitting: Family Medicine

## 2023-03-25 ENCOUNTER — Encounter: Payer: Self-pay | Admitting: Family Medicine

## 2023-03-25 ENCOUNTER — Ambulatory Visit: Payer: Commercial Managed Care - PPO | Admitting: Family Medicine

## 2023-03-25 VITALS — BP 118/78 | HR 82 | Temp 97.8°F | Resp 18 | Ht 61.5 in | Wt 160.0 lb

## 2023-03-25 DIAGNOSIS — E119 Type 2 diabetes mellitus without complications: Secondary | ICD-10-CM

## 2023-03-25 DIAGNOSIS — Z7984 Long term (current) use of oral hypoglycemic drugs: Secondary | ICD-10-CM | POA: Diagnosis not present

## 2023-03-25 LAB — HEMOGLOBIN A1C: Hgb A1c MFr Bld: 7.6 % — ABNORMAL HIGH (ref 4.6–6.5)

## 2023-03-25 LAB — BASIC METABOLIC PANEL
BUN: 13 mg/dL (ref 6–23)
CO2: 30 mEq/L (ref 19–32)
Calcium: 10 mg/dL (ref 8.4–10.5)
Chloride: 100 mEq/L (ref 96–112)
Creatinine, Ser: 0.62 mg/dL (ref 0.40–1.20)
GFR: 99.95 mL/min (ref >60.00)
Glucose, Bld: 128 mg/dL — ABNORMAL HIGH (ref 70–99)
Potassium: 4 mEq/L (ref 3.5–5.1)
Sodium: 139 mEq/L (ref 135–145)

## 2023-03-25 LAB — MICROALBUMIN / CREATININE URINE RATIO
Creatinine,U: 116.3 mg/dL
Microalb Creat Ratio: 0.6 mg/g (ref 0.0–30.0)
Microalb, Ur: 0.7 mg/dL (ref 0.0–1.9)

## 2023-03-25 NOTE — Progress Notes (Signed)
   Subjective:    Patient ID: Sonia Little, female    DOB: [birth year], 57 y.o.   MRN: [id number]  HPI DM- chronic problem, on Metformin XR 500mg  BID.  On ACE for renal protection but due for Microalbumin.  UTD on foot exam and eye exam.  Weight is stable.  Pt reports feeling 'pretty good'.  No CP, SOB, HA's, visual changes, abd pain, N/V.  Denies numbness/tingling of hands/feet.  Pt is more aware of what she is eating and is back in the gym.   Review of Systems For ROS see HPI     Objective:   Physical Exam Vitals reviewed.  Constitutional:      General: She is not in acute distress.    Appearance: Normal appearance. She is well-developed. She is not ill-appearing.  HENT:     Head: Normocephalic and atraumatic.  Eyes:     Conjunctiva/sclera: Conjunctivae normal.     Pupils: Pupils are equal, round, and reactive to light.  Neck:     Thyroid: No thyromegaly.  Cardiovascular:     Rate and Rhythm: Normal rate and regular rhythm.     Pulses: Normal pulses.     Heart sounds: Normal heart sounds. No murmur heard. Pulmonary:     Effort: Pulmonary effort is normal. No respiratory distress.     Breath sounds: Normal breath sounds.  Abdominal:     General: There is no distension.     Palpations: Abdomen is soft.     Tenderness: There is no abdominal tenderness.  Musculoskeletal:     Cervical back: Normal range of motion and neck supple.     Right lower leg: No edema.     Left lower leg: No edema.  Lymphadenopathy:     Cervical: No cervical adenopathy.  Skin:    General: Skin is warm and dry.  Neurological:     General: No focal deficit present.     Mental Status: She is alert and oriented to person, place, and time.  Psychiatric:        Mood and Affect: Mood normal.        Behavior: Behavior normal.        Thought Content: Thought content normal.           Assessment & Plan:

## 2023-03-25 NOTE — Patient Instructions (Signed)
Schedule your complete physical in 3-4 months We'll notify you of your lab results and make any changes if needed Continue to work on low carb/low sugar diet and regular exercise- you can do it! Call with any questions or concerns Have a great summer!!!

## 2023-03-25 NOTE — Assessment & Plan Note (Signed)
Ongoing issue for pt.  She notes that sugars are labile when she is stressed.  UTD on foot exam and eye exam.  Microalbumin ordered.  Tolerating Metformin w/o difficulty.  Check labs.  Adjust meds prn

## 2023-03-26 ENCOUNTER — Telehealth: Payer: Self-pay

## 2023-03-26 NOTE — Telephone Encounter (Signed)
-----   Message from Sheliah Hatch, MD sent at 03/26/2023  7:42 AM EDT ----- A1C is stable.  Continue to work on low carb diet and regular exercise to get this number closer to 7 (or under!).  You can do this!

## 2023-03-26 NOTE — Telephone Encounter (Signed)
Pt aware of lab results 

## 2023-05-28 ENCOUNTER — Other Ambulatory Visit: Payer: Self-pay | Admitting: Family Medicine

## 2023-05-29 ENCOUNTER — Other Ambulatory Visit: Payer: Self-pay | Admitting: Family Medicine

## 2023-06-02 ENCOUNTER — Other Ambulatory Visit: Payer: Self-pay | Admitting: Family Medicine

## 2023-06-06 ENCOUNTER — Ambulatory Visit (HOSPITAL_COMMUNITY)
Admission: EM | Admit: 2023-06-06 | Discharge: 2023-06-06 | Disposition: A | Discharge status: Home / Self Care | Payer: Commercial Managed Care - PPO | Attending: Emergency Medicine | Admitting: Emergency Medicine

## 2023-06-06 ENCOUNTER — Encounter (HOSPITAL_COMMUNITY): Payer: Self-pay | Admitting: Emergency Medicine

## 2023-06-06 DIAGNOSIS — U071 COVID-19: Secondary | ICD-10-CM

## 2023-06-06 MED ORDER — PAXLOVID (300/100) 20 X 150 MG & 10 X 100MG PO TBPK: 3.0000 | ORAL_TABLET | Freq: Two times a day (BID) | ORAL | 0 refills | Status: AC

## 2023-06-06 MED ORDER — PROMETHAZINE-DM 6.25-15 MG/5ML PO SYRP: 5.0000 mL | ORAL_SOLUTION | Freq: Four times a day (QID) | ORAL | 0 refills | Status: DC | PRN

## 2023-06-06 NOTE — ED Triage Notes (Signed)
Pt reports her grand daughter gave her covid.was positive at home today. Symptoms of headache, cough, body aches, congestion and fevers started yesterday.  Hasn't taken any medications for her symptoms

## 2023-06-06 NOTE — ED Provider Notes (Addendum)
[facility]    CSN: [id number] Arrival date & time: 06/06/23  1007      History   Chief Complaint Chief Complaint  Patient presents with   Covid Positive    HPI Sonia Little is a 57 y.o. female.   Patient presents to clinic for complaints of COVID-19 infection.  Her granddaughter who attends daycare recently contracted COVID and then transmitted it to her.  She took a home test today and it was positive.  Her symptoms include headache, cough, body aches, congestion, and low-grade fevers, the symptoms started yesterday.  She has not taken any medication for her symptoms.  She has a history of hypertension, type 2 diabetes, and hyperlipidemia.  She did take Paxlovid for COVID-19 infection in February and this worked well for her.  Her GFR was 99.95 on Mar 25, 2023.    The history is provided by the patient and medical records.    Past Medical History:  Diagnosis Date   CTS (carpal tunnel syndrome)    Exophthalmos, unspecified    Hypertension    Other and unspecified hyperlipidemia    Type II or unspecified type diabetes mellitus without mention of complication, not stated as uncontrolled     Patient Active Problem List   Diagnosis Date Noted   Adjustment disorder with depressed mood 10/30/2021   Allergic rhinitis due to animal (cat) (dog) hair and dander 10/31/2020   Allergic rhinitis due to pollen 10/31/2020   Chronic allergic conjunctivitis 10/31/2020   Mild intermittent asthma 10/31/2020   Obesity (BMI 30-39.9) 10/31/2020   HTN (hypertension) 10/18/2018   Physical exam 01/16/2015   Hypopotassemia 03/30/2013   Diabetes mellitus type II, controlled, with no complications (HCC) 07/11/2009   CARPAL TUNNEL SYNDROME 03/30/2008   Hyperlipidemia associated with type 2 diabetes mellitus (HCC) 05/04/2007   EXOPHTHALMOS NOS 04/02/2007    Past Surgical History:  Procedure Laterality Date   TONSILLECTOMY     TOTAL ABDOMINAL HYSTERECTOMY  11/17/2009    For fibroids with pelvic pain    WISDOM TOOTH EXTRACTION      OB History   No obstetric history on file.      Home Medications    Prior to Admission medications   Medication Sig Start Date End Date Taking? Authorizing Provider  nirmatrelvir & ritonavir (PAXLOVID, 300/100,) 20 x 150 MG & 10 x 100MG  TBPK Take 3 tablets by mouth 2 (two) times daily for 5 days. 06/06/23 06/11/23 Yes Rinaldo Ratel, [place] N, FNP  promethazine-dextromethorphan (PROMETHAZINE-DM) 6.25-15 MG/5ML syrup Take 5 mLs by mouth 4 (four) times daily as needed for cough. 06/06/23  Yes Rinaldo Ratel, [place] N, FNP  albuterol (VENTOLIN HFA) 108 (90 Base) MCG/ACT inhaler 1-2 puff as needed 04/03/22   [provider]  Ascorbic Acid (VITAMIN C) 250 MG CHEW     [provider]  atorvastatin (LIPITOR) 40 MG tablet TAKE 1 TABLET BY MOUTH EVERY DAY 06/02/23   Sheliah Hatch, MD  AUVI-Q 0.3 MG/0.3ML SOAJ injection INJECT AS NEEDED FOR SEVERE ALLERGIC REACTION INCLUDING ANAPHYLAXIS AS DIRECTED 12/10/17   [provider]  cetirizine (ZYRTEC) 10 MG tablet Take 10 mg by mouth daily.    [provider]  Chromium Picolinate 200 MCG TABS  06/18/19   [provider]  Thressa Sheller POWD  06/18/19   [provider]  finasteride (PROSCAR) 5 MG tablet Take 2.5 mg by mouth daily. 05/01/22   [provider]  fish oil-omega-3 fatty acids 1000 MG capsule Take 2 g  by mouth daily.    [provider]  FLUOCINOLONE ACETONIDE SCALP 0.01 % OIL Pt uses oil on scalp once a week. 08/04/13   [provider]  fluticasone (FLONASE) 50 MCG/ACT nasal spray SPRAY 1 TO 2 SPRAYS INTO EACH NOSTRIL ONCE A DAY    [provider]  ketoconazole (NIZORAL) 2 % shampoo SMARTSIG:5 Milliliter(s) Topical 3 Times a Week 09/22/19   [provider]  meclizine (ANTIVERT) 25 MG tablet Take 1 tablet (25 mg total) by mouth 3 (three) times daily as needed for dizziness. 03/13/22   Sheliah Hatch,  MD  metFORMIN (GLUCOPHAGE-XR) 500 MG 24 hr tablet TAKE 1 TABLET BY MOUTH TWICE A DAY 05/29/23   Sheliah Hatch, MD  omeprazole (PRILOSEC) 20 MG capsule TAKE 1 CAPSULE BY MOUTH EVERY DAY 05/28/23   Sheliah Hatch, MD  ondansetron (ZOFRAN) 4 MG tablet Take 1 tablet (4 mg total) by mouth every 8 (eight) hours as needed for nausea or vomiting. 08/15/20   Sheliah Hatch, MD  ramipril (ALTACE) 2.5 MG capsule TAKE 1 CAPSULE BY MOUTH EVERY DAY 05/29/23   Sheliah Hatch, MD    Family History Family History  Problem Relation Age of Onset   Diabetes Mother    Hypertension Mother    Hypertension Father    Diabetes Father    Heart failure Father    Hypertension Sister    Diabetes Maternal Aunt    Diabetes Maternal Uncle    Hypertension Brother    Colon cancer Neg Hx    Stomach cancer Neg Hx    Breast cancer Neg Hx     Social History Social History   Tobacco Use   Smoking status: Never   Smokeless tobacco: Never  Vaping Use   Vaping status: Never Used  Substance Use Topics   Alcohol use: No   Drug use: No     Allergies   Aspirin and Simvastatin   Review of Systems Review of Systems  Constitutional:  Positive for chills and fever.  HENT:  Positive for congestion.   Respiratory:  Positive for cough. Negative for shortness of breath and wheezing.   Cardiovascular:  Negative for chest pain.  Neurological:  Positive for headaches.     Physical Exam Triage Vital Signs ED Triage Vitals  Encounter Vitals Group     BP      Systolic BP Percentile      Diastolic BP Percentile      Pulse      Resp      Temp      Temp src      SpO2      Weight      Height      Head Circumference      Peak Flow      Pain Score      Pain Loc      Pain Education      Exclude from Growth Chart    No data found.  Updated Vital Signs BP 137/87 (BP Location: Right Arm)   Pulse (!) 130   Temp (!) 100.7 F (38.2 C) (Oral)   Resp 19   SpO2 97%   Visual Acuity Right Eye  Distance:   Left Eye Distance:   Bilateral Distance:    Right Eye Near:   Left Eye Near:    Bilateral Near:     Physical Exam Vitals and nursing note reviewed.  HENT:     Head: Normocephalic and atraumatic.  Right Ear: External ear normal.     Left Ear: External ear normal.     Nose: Congestion present.     Mouth/Throat:     Mouth: Mucous membranes are moist.  Eyes:     Conjunctiva/sclera: Conjunctivae normal.  Cardiovascular:     Rate and Rhythm: Regular rhythm. Tachycardia present.     Heart sounds: Normal heart sounds. No murmur heard. Pulmonary:     Effort: Pulmonary effort is normal. No respiratory distress.     Breath sounds: Normal breath sounds.  Musculoskeletal:        General: Normal range of motion.  Skin:    General: Skin is warm and dry.  Neurological:     General: No focal deficit present.  Psychiatric:        Mood and Affect: Mood normal.      UC Treatments / Results  Labs (all labs ordered are listed, but only abnormal results are displayed) Labs Reviewed - No data to display  EKG   Radiology No results found.  Procedures Procedures (including critical care time)  Medications Ordered in UC Medications - No data to display  Initial Impression / Assessment and Plan / UC Course  I have reviewed the triage vital signs and the nursing notes.  Pertinent labs & imaging results that were available during my care of the patient were reviewed by me and considered in my medical decision making (see chart for details).  Vitals and triage reviewed, patient is hemodynamically stable.  She is tachycardic with a low-grade fever.  Staff completed an EKG that shows sinus tachycardia at a rate of 135, without ST elevation or depression.  No wheezing, shortness of breath or chest pain.  Patient declined Tylenol in clinic, reports she has some at home.  Lungs are vesicular posteriorly.  Would like Paxlovid, did well on this in February.  Recent GFR was 99.95.   Will send in Paxlovid, symptomatic management discussed.  Plan of care, follow-up care and return precautions given, no questions at this time.     Final Clinical Impressions(s) / UC Diagnoses   Final diagnoses:  COVID-19 virus infection     Discharge Instructions      Please start taking the Paxlovid as prescribed.  You can use the cough syrup up to 4 times daily as needed for cough, do not drink or drive on this medication as it may make you drowsy.  You may take Tylenol as needed for your fevers.  Ensure you are staying hydrated with at least 64 ounces of water daily.  Return to clinic or follow-up with your primary care provider for any new or concerning symptoms.     ED Prescriptions     Medication Sig Dispense Auth. Provider   nirmatrelvir & ritonavir (PAXLOVID, 300/100,) 20 x 150 MG & 10 x 100MG  TBPK Take 3 tablets by mouth 2 (two) times daily for 5 days. 30 tablet Rinaldo Ratel, [place] N, [place]   promethazine-dextromethorphan (PROMETHAZINE-DM) 6.25-15 MG/5ML syrup Take 5 mLs by mouth 4 (four) times daily as needed for cough. 118 mL Ival Basquez, [place] N, [place]      PDMP not reviewed this encounter.       Ryszard Socarras, [place] N, [place] 06/06/23 1056

## 2023-06-06 NOTE — Discharge Instructions (Addendum)
Please start taking the Paxlovid as prescribed.  You can use the cough syrup up to 4 times daily as needed for cough, do not drink or drive on this medication as it may make you drowsy.  You may take Tylenol as needed for your fevers.  Ensure you are staying hydrated with at least 64 ounces of water daily.  Return to clinic or follow-up with your primary care provider for any new or concerning symptoms.

## 2023-06-17 ENCOUNTER — Encounter (INDEPENDENT_AMBULATORY_CARE_PROVIDER_SITE_OTHER): Payer: Self-pay

## 2023-07-08 ENCOUNTER — Ambulatory Visit (INDEPENDENT_AMBULATORY_CARE_PROVIDER_SITE_OTHER): Payer: Commercial Managed Care - PPO | Admitting: Family Medicine

## 2023-07-08 ENCOUNTER — Encounter: Payer: Self-pay | Admitting: Family Medicine

## 2023-07-08 VITALS — BP 128/82 | HR 71 | Temp 97.9°F | Resp 18 | Ht 61.5 in | Wt 160.0 lb

## 2023-07-08 DIAGNOSIS — Z0000 Encounter for general adult medical examination without abnormal findings: Secondary | ICD-10-CM

## 2023-07-08 DIAGNOSIS — E119 Type 2 diabetes mellitus without complications: Secondary | ICD-10-CM | POA: Diagnosis not present

## 2023-07-08 LAB — CBC WITH DIFFERENTIAL/PLATELET
Basophils Absolute: 0 10*3/uL (ref 0.0–0.1)
Basophils Relative: 0.3 % (ref 0.0–3.0)
Eosinophils Absolute: 0 10*3/uL (ref 0.0–0.7)
Eosinophils Relative: 0.8 % (ref 0.0–5.0)
HCT: 39.9 % (ref 36.0–46.0)
Hemoglobin: 13.1 g/dL (ref 12.0–15.0)
Lymphocytes Relative: 56.2 % — ABNORMAL HIGH (ref 12.0–46.0)
Lymphs Abs: 3.3 10*3/uL (ref 0.7–4.0)
MCHC: 32.7 g/dL (ref 30.0–36.0)
MCV: 93.9 fl (ref 78.0–100.0)
Monocytes Absolute: 0.4 10*3/uL (ref 0.1–1.0)
Monocytes Relative: 6.1 % (ref 3.0–12.0)
Neutro Abs: 2.1 10*3/uL (ref 1.4–7.7)
Neutrophils Relative %: 36.6 % — ABNORMAL LOW (ref 43.0–77.0)
Platelets: 325 10*3/uL (ref 150.0–400.0)
RBC: 4.25 Mil/uL (ref 3.87–5.11)
RDW: 14.6 % (ref 11.5–15.5)
WBC: 5.8 10*3/uL (ref 4.0–10.5)

## 2023-07-08 LAB — LIPID PANEL
Cholesterol: 203 mg/dL — ABNORMAL HIGH (ref 0–200)
HDL: 65.5 mg/dL (ref >39.00)
LDL Cholesterol: 122 mg/dL — ABNORMAL HIGH (ref 0–99)
NonHDL: 137.21
Total CHOL/HDL Ratio: 3
Triglycerides: 75 mg/dL (ref 0.0–149.0)
VLDL: 15 mg/dL (ref 0.0–40.0)

## 2023-07-08 LAB — BASIC METABOLIC PANEL
BUN: 10 mg/dL (ref 6–23)
CO2: 31 meq/L (ref 19–32)
Calcium: 9.5 mg/dL (ref 8.4–10.5)
Chloride: 102 meq/L (ref 96–112)
Creatinine, Ser: 0.64 mg/dL (ref 0.40–1.20)
GFR: 98.99 mL/min (ref >60.00)
Glucose, Bld: 135 mg/dL — ABNORMAL HIGH (ref 70–99)
Potassium: 4.3 meq/L (ref 3.5–5.1)
Sodium: 141 meq/L (ref 135–145)

## 2023-07-08 LAB — HEPATIC FUNCTION PANEL
ALT: 33 U/L (ref 0–35)
AST: 17 U/L (ref 0–37)
Albumin: 4.3 g/dL (ref 3.5–5.2)
Alkaline Phosphatase: 42 U/L (ref 39–117)
Bilirubin, Direct: 0.1 mg/dL (ref 0.0–0.3)
Total Bilirubin: 0.6 mg/dL (ref 0.2–1.2)
Total Protein: 6.7 g/dL (ref 6.0–8.3)

## 2023-07-08 LAB — HEMOGLOBIN A1C: Hgb A1c MFr Bld: 8 % — ABNORMAL HIGH (ref 4.6–6.5)

## 2023-07-08 NOTE — Assessment & Plan Note (Signed)
Pt's PE WNL.  UTD on mammo, colonoscopy, eye exam, micro albumin, Tdap.  No need for pap due to TAH.  Check labs.  Anticipatory guidance provided.

## 2023-07-08 NOTE — Assessment & Plan Note (Signed)
Chronic problem.  Tolerating Metformin w/o difficulty.  UTD on eye exam, microalbumin.  Foot exam done today.  Check labs.  Adjust meds prn  ?

## 2023-07-08 NOTE — Progress Notes (Signed)
   Subjective:    Patient ID: Sonia Little, female    DOB: 26-Apr-1966, 57 y.o.   MRN: [id number]  HPI CPE- UTD on mammo, colonoscopy, eye exam, microalbumin  Patient Care Team    Relationship Specialty Notifications Start End  Sheliah Hatch, MD PCP - General Family Medicine  09/15/14   Croitoru, Rachelle Hora, MD PCP - Cardiology Cardiology  03/20/22   Teodora Medici, MD Consulting Physician Gynecology  01/16/15   Ludger Nutting, MD Consulting Physician Podiatry  06/18/15      Health Maintenance  Topic Date Due   INFLUENZA VACCINE  06/18/2023   HEMOGLOBIN A1C  09/25/2023   OPHTHALMOLOGY EXAM  11/06/2023   MAMMOGRAM  11/19/2023   Diabetic kidney evaluation - eGFR measurement  03/24/2024   Diabetic kidney evaluation - Urine ACR  03/24/2024   FOOT EXAM  07/07/2024   Colonoscopy  11/10/2027   DTaP/Tdap/Td (4 - Td or Tdap) 08/14/2029   HPV VACCINES  Aged Out   COVID-19 Vaccine  Discontinued   Hepatitis C Screening  Discontinued   HIV Screening  Discontinued   Zoster Vaccines- Shingrix  Discontinued      Review of Systems Patient reports no vision/ hearing changes, adenopathy,fever, weight change,  persistant/recurrent hoarseness , swallowing issues, chest pain, palpitations, edema, persistant/recurrent cough, hemoptysis, dyspnea (rest/exertional/paroxysmal nocturnal), gastrointestinal bleeding (melena, rectal bleeding), abdominal pain, significant heartburn, bowel changes, GU symptoms (dysuria, hematuria, incontinence), Gyn symptoms (abnormal  bleeding, pain),  syncope, focal weakness, memory loss, numbness & tingling, skin/hair/nail changes, abnormal bruising or bleeding, anxiety, or depression.     Objective:   Physical Exam General Appearance:    Alert, cooperative, no distress, appears stated age  Head:    Normocephalic, without obvious abnormality, atraumatic  Eyes:    PERRL, conjunctiva/corneas clear, EOM's intact both eyes  Ears:    Normal TM's and external ear canals, both  ears  Nose:   Nares normal, septum midline, mucosa normal, no drainage    or sinus tenderness  Throat:   Lips, mucosa, and tongue normal; teeth and gums normal  Neck:   Supple, symmetrical, trachea midline, no adenopathy;    Thyroid: no enlargement/tenderness/nodules  Back:     Symmetric, no curvature, ROM normal, no CVA tenderness  Lungs:     Clear to auscultation bilaterally, respirations unlabored  Chest Wall:    No tenderness or deformity   Heart:    Regular rate and rhythm, S1 and S2 normal, no murmur, rub   or gallop  Breast Exam:    Deferred to GYN  Abdomen:     Soft, non-tender, bowel sounds active all four quadrants,    no masses, no organomegaly  Genitalia:    Deferred to GYN  Rectal:    Extremities:   Extremities normal, atraumatic, no cyanosis or edema  Pulses:   2+ and symmetric all extremities  Skin:   Skin color, texture, turgor normal, no rashes or lesions  Lymph nodes:   Cervical, supraclavicular, and axillary nodes normal  Neurologic:   CNII-XII intact, normal strength, sensation and reflexes    throughout          Assessment & Plan:

## 2023-07-08 NOTE — Patient Instructions (Signed)
Follow up in 3-4 months to recheck sugars We'll notify you of your lab results and make any changes if needed Keep up the good work on healthy diet and regular exercise- you look great!!! Call with any questions or concerns Stay Safe!  Stay Healthy! Happy Labor Day!!!

## 2023-07-10 LAB — TSH: TSH: 3.83 u[IU]/mL (ref 0.35–5.50)

## 2023-07-13 ENCOUNTER — Other Ambulatory Visit: Payer: Self-pay

## 2023-07-13 ENCOUNTER — Telehealth: Payer: Self-pay

## 2023-07-13 DIAGNOSIS — E119 Type 2 diabetes mellitus without complications: Secondary | ICD-10-CM

## 2023-07-13 MED ORDER — EMPAGLIFLOZIN 10 MG PO TABS
10.0000 mg | ORAL_TABLET | Freq: Every day | ORAL | 3 refills | Status: DC
Start: 2023-07-13 — End: 2023-10-07

## 2023-07-13 NOTE — Telephone Encounter (Signed)
-----   Message from Neena Rhymes sent at 07/13/2023  4:30 PM EDT ----- A1C has jumped from 7.6 -->8%  Based on this, we need to add Jardiance 10mg  daily to help bring these sugars back in range (#30, 3 refills) while working on healthy diet and regular exercise.  Total cholesterol and LDL (bad cholesterol) have both increased.  Continue your Atorvastatin and this will also improve w/ healthy diet and regular exercise.  Remainder of labs are stable and look good

## 2023-07-13 NOTE — Telephone Encounter (Signed)
Pt seen results via my chart sent in jardiacne to pharmacy

## 2023-07-28 ENCOUNTER — Ambulatory Visit (INDEPENDENT_AMBULATORY_CARE_PROVIDER_SITE_OTHER): Payer: Commercial Managed Care - PPO

## 2023-07-28 DIAGNOSIS — Z23 Encounter for immunization: Secondary | ICD-10-CM

## 2023-07-28 NOTE — Progress Notes (Signed)
Sonia Little is a 57 y.o. female presents to the office today forInfluenza injections, per physician's orders.  Flulaval was administered Right Deltoid   today. Patient tolerated injection. Date due: season 25/26, appt made No   Sonia Little [facility]

## 2023-08-06 LAB — HM DIABETES EYE EXAM

## 2023-08-19 ENCOUNTER — Encounter: Payer: Self-pay | Admitting: Family Medicine

## 2023-08-28 ENCOUNTER — Other Ambulatory Visit: Payer: Self-pay | Admitting: Family Medicine

## 2023-08-30 ENCOUNTER — Other Ambulatory Visit: Payer: Self-pay | Admitting: Family Medicine

## 2023-08-31 ENCOUNTER — Other Ambulatory Visit: Payer: Self-pay | Admitting: Family Medicine

## 2023-10-05 ENCOUNTER — Other Ambulatory Visit: Payer: Self-pay | Admitting: Family Medicine

## 2023-10-05 DIAGNOSIS — Z1231 Encounter for screening mammogram for malignant neoplasm of breast: Secondary | ICD-10-CM

## 2023-10-07 ENCOUNTER — Ambulatory Visit: Payer: Commercial Managed Care - PPO | Admitting: Family Medicine

## 2023-10-07 ENCOUNTER — Encounter: Payer: Self-pay | Admitting: Family Medicine

## 2023-10-07 VITALS — BP 122/80 | HR 84 | Temp 97.8°F | Ht 61.5 in | Wt 155.0 lb

## 2023-10-07 DIAGNOSIS — E119 Type 2 diabetes mellitus without complications: Secondary | ICD-10-CM | POA: Diagnosis not present

## 2023-10-07 DIAGNOSIS — Z7984 Long term (current) use of oral hypoglycemic drugs: Secondary | ICD-10-CM | POA: Diagnosis not present

## 2023-10-07 LAB — BASIC METABOLIC PANEL
BUN: 12 mg/dL (ref 6–23)
CO2: 28 meq/L (ref 19–32)
Calcium: 9.7 mg/dL (ref 8.4–10.5)
Chloride: 102 meq/L (ref 96–112)
Creatinine, Ser: 0.64 mg/dL (ref 0.40–1.20)
GFR: 98.82 mL/min (ref >60.00)
Glucose, Bld: 118 mg/dL — ABNORMAL HIGH (ref 70–99)
Potassium: 4.1 meq/L (ref 3.5–5.1)
Sodium: 139 meq/L (ref 135–145)

## 2023-10-07 LAB — HEMOGLOBIN A1C: Hgb A1c MFr Bld: 7.1 % — ABNORMAL HIGH (ref 4.6–6.5)

## 2023-10-07 MED ORDER — MECLIZINE HCL 25 MG PO TABS: 25.0000 mg | ORAL_TABLET | Freq: Three times a day (TID) | ORAL | 0 refills | Status: AC | PRN

## 2023-10-07 MED ORDER — EMPAGLIFLOZIN 10 MG PO TABS: 10.0000 mg | ORAL_TABLET | Freq: Every day | ORAL | 1 refills | Status: DC

## 2023-10-07 MED ORDER — ONDANSETRON HCL 4 MG PO TABS: 4.0000 mg | ORAL_TABLET | Freq: Three times a day (TID) | ORAL | 0 refills | Status: AC | PRN

## 2023-10-07 NOTE — Patient Instructions (Signed)
Follow up in 3-4 months to recheck sugar, BP, and cholesterol We'll notify you of your lab results and make any changes if needed Continue to work on healthy diet and regular exercise- you can do it! Call with any questions or concerns Stay Safe!  Stay Healthy! Happy Holidays!!

## 2023-10-07 NOTE — Progress Notes (Signed)
   Subjective:    Patient ID: Sonia Little, female    DOB: December 07, 1965, 57 y.o.   MRN: [id number]  HPI DM- chronic problem, on Jardiance 10mg  daily, Metformin XR 500mg  BID.  UTD on foot exam, eye exam, microalbumin.  Last A1C 8%.  Down 5 lbs.  No CP, SOB, HA's, visual changes, abd pain, N/V.  No numbness/tingling or feet.  Denies symptomatic lows.     Review of Systems For ROS see HPI     Objective:   Physical Exam Vitals reviewed.  Constitutional:      General: She is not in acute distress.    Appearance: Normal appearance. She is well-developed. She is not ill-appearing.  HENT:     Head: Normocephalic and atraumatic.  Eyes:     Conjunctiva/sclera: Conjunctivae normal.     Pupils: Pupils are equal, round, and reactive to light.  Neck:     Thyroid: No thyromegaly.  Cardiovascular:     Rate and Rhythm: Normal rate and regular rhythm.     Pulses: Normal pulses.     Heart sounds: Normal heart sounds. No murmur heard. Pulmonary:     Effort: Pulmonary effort is normal. No respiratory distress.     Breath sounds: Normal breath sounds.  Abdominal:     General: There is no distension.     Palpations: Abdomen is soft.     Tenderness: There is no abdominal tenderness.  Musculoskeletal:     Cervical back: Normal range of motion and neck supple.     Right lower leg: No edema.     Left lower leg: No edema.  Lymphadenopathy:     Cervical: No cervical adenopathy.  Skin:    General: Skin is warm and dry.  Neurological:     General: No focal deficit present.     Mental Status: She is alert and oriented to person, place, and time.  Psychiatric:        Mood and Affect: Mood normal.        Behavior: Behavior normal.        Thought Content: Thought content normal.           Assessment & Plan:

## 2023-10-07 NOTE — Assessment & Plan Note (Signed)
Ongoing issue for pt.  Currently on Metformin and Jardiance.  UTD on foot exam, eye exam, microalbumin.  Check labs.  Adjust meds prn

## 2023-10-08 ENCOUNTER — Telehealth: Payer: Self-pay

## 2023-10-08 NOTE — Telephone Encounter (Signed)
-----   Message from Neena Rhymes sent at 10/08/2023  7:40 AM EST ----- A1C is MUCH better at 7.1%.  Keep up the good work!!!

## 2023-10-08 NOTE — Telephone Encounter (Signed)
Pt has been notified about labs. No concerns

## 2023-11-23 ENCOUNTER — Ambulatory Visit: Payer: Commercial Managed Care - PPO

## 2023-11-25 ENCOUNTER — Other Ambulatory Visit: Payer: Self-pay | Admitting: Family Medicine

## 2023-11-26 ENCOUNTER — Other Ambulatory Visit: Payer: Self-pay | Admitting: Family Medicine

## 2023-12-10 ENCOUNTER — Ambulatory Visit
Admission: RE | Admit: 2023-12-10 | Discharge: 2023-12-10 | Disposition: A | Discharge status: Home / Self Care | Payer: Commercial Managed Care - PPO | Source: Ambulatory Visit | Attending: Family Medicine | Admitting: Family Medicine

## 2023-12-10 DIAGNOSIS — Z1231 Encounter for screening mammogram for malignant neoplasm of breast: Secondary | ICD-10-CM

## 2024-01-08 ENCOUNTER — Encounter: Payer: Self-pay | Admitting: Family Medicine

## 2024-01-08 ENCOUNTER — Ambulatory Visit: Payer: Commercial Managed Care - PPO | Admitting: Family Medicine

## 2024-01-08 VITALS — BP 132/78 | HR 75 | Temp 97.7°F | Ht 61.5 in | Wt 152.1 lb

## 2024-01-08 DIAGNOSIS — Z7984 Long term (current) use of oral hypoglycemic drugs: Secondary | ICD-10-CM | POA: Diagnosis not present

## 2024-01-08 DIAGNOSIS — I10 Essential (primary) hypertension: Secondary | ICD-10-CM | POA: Diagnosis not present

## 2024-01-08 DIAGNOSIS — Z23 Encounter for immunization: Secondary | ICD-10-CM | POA: Diagnosis not present

## 2024-01-08 DIAGNOSIS — E1169 Type 2 diabetes mellitus with other specified complication: Secondary | ICD-10-CM

## 2024-01-08 DIAGNOSIS — E785 Hyperlipidemia, unspecified: Secondary | ICD-10-CM | POA: Diagnosis not present

## 2024-01-08 DIAGNOSIS — E119 Type 2 diabetes mellitus without complications: Secondary | ICD-10-CM

## 2024-01-08 LAB — CBC WITH DIFFERENTIAL/PLATELET
Basophils Absolute: 0 10*3/uL (ref 0.0–0.1)
Basophils Relative: 0.5 % (ref 0.0–3.0)
Eosinophils Absolute: 0.1 10*3/uL (ref 0.0–0.7)
Eosinophils Relative: 1.3 % (ref 0.0–5.0)
HCT: 40.6 % (ref 36.0–46.0)
Hemoglobin: 13.3 g/dL (ref 12.0–15.0)
Lymphocytes Relative: 51 % — ABNORMAL HIGH (ref 12.0–46.0)
Lymphs Abs: 2.3 10*3/uL (ref 0.7–4.0)
MCHC: 32.8 g/dL (ref 30.0–36.0)
MCV: 92.6 fL (ref 78.0–100.0)
Monocytes Absolute: 0.3 10*3/uL (ref 0.1–1.0)
Monocytes Relative: 5.9 % (ref 3.0–12.0)
Neutro Abs: 1.8 10*3/uL (ref 1.4–7.7)
Neutrophils Relative %: 41.3 % — ABNORMAL LOW (ref 43.0–77.0)
Platelets: 369 10*3/uL (ref 150.0–400.0)
RBC: 4.38 Mil/uL (ref 3.87–5.11)
RDW: 14.5 % (ref 11.5–15.5)
WBC: 4.4 10*3/uL (ref 4.0–10.5)

## 2024-01-08 LAB — LIPID PANEL
Cholesterol: 160 mg/dL (ref 0–200)
HDL: 50.7 mg/dL (ref >39.00)
LDL Cholesterol: 97 mg/dL (ref 0–99)
NonHDL: 109.74
Total CHOL/HDL Ratio: 3
Triglycerides: 64 mg/dL (ref 0.0–149.0)
VLDL: 12.8 mg/dL (ref 0.0–40.0)

## 2024-01-08 LAB — HEPATIC FUNCTION PANEL
ALT: 28 U/L (ref 0–35)
AST: 21 U/L (ref 0–37)
Albumin: 4.6 g/dL (ref 3.5–5.2)
Alkaline Phosphatase: 38 U/L — ABNORMAL LOW (ref 39–117)
Bilirubin, Direct: 0.1 mg/dL (ref 0.0–0.3)
Total Bilirubin: 0.6 mg/dL (ref 0.2–1.2)
Total Protein: 7.1 g/dL (ref 6.0–8.3)

## 2024-01-08 LAB — TSH: TSH: 2.35 u[IU]/mL (ref 0.35–5.50)

## 2024-01-08 LAB — BASIC METABOLIC PANEL
BUN: 11 mg/dL (ref 6–23)
CO2: 30 meq/L (ref 19–32)
Calcium: 9.5 mg/dL (ref 8.4–10.5)
Chloride: 103 meq/L (ref 96–112)
Creatinine, Ser: 0.62 mg/dL (ref 0.40–1.20)
GFR: 99.4 mL/min (ref >60.00)
Glucose, Bld: 102 mg/dL — ABNORMAL HIGH (ref 70–99)
Potassium: 4.2 meq/L (ref 3.5–5.1)
Sodium: 140 meq/L (ref 135–145)

## 2024-01-08 LAB — HEMOGLOBIN A1C: Hgb A1c MFr Bld: 7.1 % — ABNORMAL HIGH (ref 4.6–6.5)

## 2024-01-08 NOTE — Assessment & Plan Note (Signed)
 Chronic problem.  On Lipitor 40mg  daily w/o difficulty.  Check labs.  Adjust meds prn  ?

## 2024-01-08 NOTE — Progress Notes (Signed)
   Subjective:    Patient ID: Sonia Little, female    DOB: 10-23-1966, 58 y.o.   MRN: [id number]  HPI Hyperlipidemia- chronic problem, on Lipitor 40mg  daily.  No abd pain, N/V.  HTN- chronic problem, on Ramipril 2.5mg  daily.  No CP, SOB, HA's, visual changes  DM- chronic problem, on Metformin XR 500mg  BID, Jardiance 10mg  daily.  UTD on eye exam, foot exam, microalbumin.  Last A1C 7.1%  no numbness/tingling of hands/feet.   Review of Systems For ROS see HPI     Objective:   Physical Exam Vitals reviewed.  Constitutional:      General: She is not in acute distress.    Appearance: Normal appearance. She is well-developed. She is not ill-appearing.  HENT:     Head: Normocephalic and atraumatic.  Eyes:     Conjunctiva/sclera: Conjunctivae normal.     Pupils: Pupils are equal, round, and reactive to light.  Neck:     Thyroid: No thyromegaly.  Cardiovascular:     Rate and Rhythm: Normal rate and regular rhythm.     Pulses: Normal pulses.     Heart sounds: Normal heart sounds. No murmur heard. Pulmonary:     Effort: Pulmonary effort is normal. No respiratory distress.     Breath sounds: Normal breath sounds.  Abdominal:     General: There is no distension.     Palpations: Abdomen is soft.     Tenderness: There is no abdominal tenderness.  Musculoskeletal:     Cervical back: Normal range of motion and neck supple.     Right lower leg: No edema.     Left lower leg: No edema.  Lymphadenopathy:     Cervical: No cervical adenopathy.  Skin:    General: Skin is warm and dry.  Neurological:     General: No focal deficit present.     Mental Status: She is alert and oriented to person, place, and time.  Psychiatric:        Mood and Affect: Mood normal.        Behavior: Behavior normal.        Thought Content: Thought content normal.           Assessment & Plan:

## 2024-01-08 NOTE — Patient Instructions (Signed)
 Follow up in 3-4 months to recheck sugars We'll notify you of your lab results and make any changes if needed Keep up the good work on healthy diet and regular exercise Call with any questions or concerns Stay Safe!  Stay Healthy!

## 2024-01-08 NOTE — Addendum Note (Signed)
 Addended by: Shon Millet on: 01/08/2024 09:13 AM   Modules accepted: Orders

## 2024-01-08 NOTE — Assessment & Plan Note (Signed)
 Chronic problem.  On Metformin XR 500mg  BID and Jardiance 10mg  daily.  UTD on eye exam, foot exam, microalbumin.  Currently asymptomatic.  Check labs.  Adjust meds prn

## 2024-01-08 NOTE — Assessment & Plan Note (Signed)
 Chronic problem.  On Ramipril 2.5mg  daily w/ good control.  Currently asymptomatic.  Check labs due to ACE use but no anticipated med changes.  Will follow.

## 2024-01-10 ENCOUNTER — Encounter: Payer: Self-pay | Admitting: Family Medicine

## 2024-01-11 ENCOUNTER — Encounter: Payer: Self-pay | Admitting: Family Medicine

## 2024-01-11 ENCOUNTER — Telehealth: Payer: Self-pay

## 2024-01-11 NOTE — Telephone Encounter (Signed)
-----   Message from Neena Rhymes sent at 01/11/2024  8:02 AM EST ----- Labs are stable and look good!  No changes at this time

## 2024-01-11 NOTE — Telephone Encounter (Signed)
 Pt has reviewed labs via MyChart

## 2024-01-12 NOTE — Telephone Encounter (Signed)
 Pt has follow up questions about lab results, please advise

## 2024-02-22 ENCOUNTER — Other Ambulatory Visit: Payer: Self-pay | Admitting: Family Medicine

## 2024-04-06 ENCOUNTER — Encounter: Payer: Self-pay | Admitting: Family Medicine

## 2024-04-06 ENCOUNTER — Ambulatory Visit: Payer: Commercial Managed Care - PPO | Admitting: Family Medicine

## 2024-04-06 ENCOUNTER — Ambulatory Visit: Payer: Self-pay | Admitting: Family Medicine

## 2024-04-06 VITALS — BP 120/60 | HR 97 | Temp 97.9°F | Ht 61.5 in | Wt 150.0 lb

## 2024-04-06 DIAGNOSIS — E119 Type 2 diabetes mellitus without complications: Secondary | ICD-10-CM

## 2024-04-06 DIAGNOSIS — Z7984 Long term (current) use of oral hypoglycemic drugs: Secondary | ICD-10-CM | POA: Diagnosis not present

## 2024-04-06 LAB — MICROALBUMIN / CREATININE URINE RATIO
Creatinine,U: 79.1 mg/dL
Microalb Creat Ratio: UNDETERMINED mg/g (ref 0.0–30.0)
Microalb, Ur: <0.7 mg/dL

## 2024-04-06 LAB — HEMOGLOBIN A1C: Hgb A1c MFr Bld: 6.9 % — ABNORMAL HIGH (ref 4.6–6.5)

## 2024-04-06 LAB — BASIC METABOLIC PANEL WITH GFR
BUN: 14 mg/dL (ref 6–23)
CO2: 29 meq/L (ref 19–32)
Calcium: 9.8 mg/dL (ref 8.4–10.5)
Chloride: 104 meq/L (ref 96–112)
Creatinine, Ser: 0.65 mg/dL (ref 0.40–1.20)
GFR: 98.1 mL/min (ref >60.00)
Glucose, Bld: 104 mg/dL — ABNORMAL HIGH (ref 70–99)
Potassium: 4 meq/L (ref 3.5–5.1)
Sodium: 141 meq/L (ref 135–145)

## 2024-04-06 NOTE — Patient Instructions (Signed)
 Follow up in 3-4 months to recheck blood pressure, cholesterol, diabetes We'll notify you of your lab results and make any changes if needed Continue to work on healthy diet and regular exercise- you look great!!! Call with any questions or concerns Stay Safe!  Stay Healthy! Have an AMAZING trip!!!

## 2024-04-06 NOTE — Assessment & Plan Note (Signed)
 Chronic problem.  On Jardiance  and Metformin  w/o difficulty.  UTD on foot exam, eye exam.  Microalbumin ordered for today.  Check labs.  Adjust meds prn

## 2024-04-06 NOTE — Progress Notes (Signed)
   Subjective:    Patient ID: Sonia Little, female    DOB: 02-18-1966, 58 y.o.   MRN: [id number]  HPI DM- chronic problem.  On Jardiance  10mg  daily, Metformin  XR 500mg  BID.  On ACE for renal protection.  Due for microalbumin.  UTD on foot exam, eye exam.  Last A1C 7.1%  Pt reports feeling well.  No CP, SOB, HA's, visual changes, abd pain, N/V.  + carpal tunnel but no numbness/tingling of feet.   Review of Systems For ROS see HPI     Objective:   Physical Exam Vitals reviewed.  Constitutional:      General: She is not in acute distress.    Appearance: Normal appearance. She is well-developed. She is not ill-appearing.  HENT:     Head: Normocephalic and atraumatic.  Eyes:     Conjunctiva/sclera: Conjunctivae normal.     Pupils: Pupils are equal, round, and reactive to light.  Neck:     Thyroid : No thyromegaly.  Cardiovascular:     Rate and Rhythm: Normal rate and regular rhythm.     Pulses: Normal pulses.     Heart sounds: Normal heart sounds. No murmur heard. Pulmonary:     Effort: Pulmonary effort is normal. No respiratory distress.     Breath sounds: Normal breath sounds.  Abdominal:     General: There is no distension.     Palpations: Abdomen is soft.     Tenderness: There is no abdominal tenderness.  Musculoskeletal:     Cervical back: Normal range of motion and neck supple.     Right lower leg: No edema.     Left lower leg: No edema.  Lymphadenopathy:     Cervical: No cervical adenopathy.  Skin:    General: Skin is warm and dry.  Neurological:     General: No focal deficit present.     Mental Status: She is alert and oriented to person, place, and time.  Psychiatric:        Mood and Affect: Mood normal.        Behavior: Behavior normal.           Assessment & Plan:

## 2024-04-24 ENCOUNTER — Emergency Department (HOSPITAL_BASED_OUTPATIENT_CLINIC_OR_DEPARTMENT_OTHER)

## 2024-04-24 ENCOUNTER — Emergency Department (HOSPITAL_BASED_OUTPATIENT_CLINIC_OR_DEPARTMENT_OTHER)
Admission: EM | Admit: 2024-04-24 | Discharge: 2024-04-24 | Disposition: A | Discharge status: Home / Self Care | Attending: Emergency Medicine | Admitting: Emergency Medicine

## 2024-04-24 ENCOUNTER — Other Ambulatory Visit: Payer: Self-pay

## 2024-04-24 ENCOUNTER — Encounter (HOSPITAL_BASED_OUTPATIENT_CLINIC_OR_DEPARTMENT_OTHER): Payer: Self-pay

## 2024-04-24 DIAGNOSIS — R1031 Right lower quadrant pain: Secondary | ICD-10-CM | POA: Insufficient documentation

## 2024-04-24 DIAGNOSIS — R61 Generalized hyperhidrosis: Secondary | ICD-10-CM | POA: Insufficient documentation

## 2024-04-24 DIAGNOSIS — Z79899 Other long term (current) drug therapy: Secondary | ICD-10-CM | POA: Insufficient documentation

## 2024-04-24 DIAGNOSIS — R110 Nausea: Secondary | ICD-10-CM | POA: Diagnosis not present

## 2024-04-24 DIAGNOSIS — E119 Type 2 diabetes mellitus without complications: Secondary | ICD-10-CM | POA: Insufficient documentation

## 2024-04-24 DIAGNOSIS — I10 Essential (primary) hypertension: Secondary | ICD-10-CM | POA: Insufficient documentation

## 2024-04-24 DIAGNOSIS — Z7984 Long term (current) use of oral hypoglycemic drugs: Secondary | ICD-10-CM | POA: Diagnosis not present

## 2024-04-24 LAB — CBC
HCT: 39.9 % (ref 36.0–46.0)
Hemoglobin: 13.1 g/dL (ref 12.0–15.0)
MCH: 30.6 pg (ref 26.0–34.0)
MCHC: 32.8 g/dL (ref 30.0–36.0)
MCV: 93.2 fL (ref 80.0–100.0)
Platelets: 365 10*3/uL (ref 150–400)
RBC: 4.28 MIL/uL (ref 3.87–5.11)
RDW: 14.2 % (ref 11.5–15.5)
WBC: 10.1 10*3/uL (ref 4.0–10.5)
nRBC: 0 % (ref 0.0–0.2)

## 2024-04-24 LAB — TROPONIN T, HIGH SENSITIVITY: Troponin T High Sensitivity: <15 ng/L (ref <19)

## 2024-04-24 LAB — URINALYSIS, ROUTINE W REFLEX MICROSCOPIC
Bacteria, UA: NONE SEEN
Bilirubin Urine: NEGATIVE
Glucose, UA: >1000 mg/dL — AB
Hgb urine dipstick: NEGATIVE
Ketones, ur: NEGATIVE mg/dL
Leukocytes,Ua: NEGATIVE
Nitrite: NEGATIVE
Protein, ur: NEGATIVE mg/dL
Specific Gravity, Urine: 1.029 (ref 1.005–1.030)
pH: 5.5 (ref 5.0–8.0)

## 2024-04-24 LAB — COMPREHENSIVE METABOLIC PANEL WITH GFR
ALT: 23 U/L (ref 0–44)
AST: 19 U/L (ref 15–41)
Albumin: 4.6 g/dL (ref 3.5–5.0)
Alkaline Phosphatase: 57 U/L (ref 38–126)
Anion gap: 14 (ref 5–15)
BUN: 18 mg/dL (ref 6–20)
CO2: 25 mmol/L (ref 22–32)
Calcium: 11 mg/dL — ABNORMAL HIGH (ref 8.9–10.3)
Chloride: 102 mmol/L (ref 98–111)
Creatinine, Ser: 0.75 mg/dL (ref 0.44–1.00)
GFR, Estimated: >60 mL/min (ref >60)
Glucose, Bld: 232 mg/dL — ABNORMAL HIGH (ref 70–99)
Potassium: 3.8 mmol/L (ref 3.5–5.1)
Sodium: 141 mmol/L (ref 135–145)
Total Bilirubin: 0.3 mg/dL (ref 0.0–1.2)
Total Protein: 7.8 g/dL (ref 6.5–8.1)

## 2024-04-24 LAB — LIPASE, BLOOD: Lipase: 36 U/L (ref 11–51)

## 2024-04-24 MED ORDER — IOHEXOL 300 MG/ML  SOLN
100.0000 mL | Freq: Once | INTRAMUSCULAR | Status: AC | PRN
  Administered 2024-04-24: 100 mL via INTRAVENOUS

## 2024-04-24 NOTE — ED Triage Notes (Signed)
 Right lower quad abdominal pain started about 2 hours ago. Pt had bm and felt sweaty afterwards. She has hx of kidney stones and states it feels like that  Brought by [email or web address]

## 2024-04-24 NOTE — ED Provider Notes (Signed)
 [place] EMERGENCY DEPARTMENT AT [facility] Provider Note   CSN: [id number] Arrival date & time: 04/24/24  1830     History  Chief Complaint  Patient presents with   Abdominal Pain    Sonia Little is a 58 y.o. female with history of type 2 diabetes, hypertension, presents with concern for sudden onset right lower quadrant pain that started earlier today.  States this came on at random, not associated with any food intake or particular activity.  She thought the pain may have been due to her needing to use the restroom, and so she went to the restroom and had a nonbloody formed bowel movement.  No diarrhea.  This did not relieve her symptoms.  The pain was ongoing for about 1 to 2 hours and associated with diaphoresis and nausea.  No vomiting.  No fever or chills.  Pain then suddenly resolved and has not returned since.  She denies any dysuria, hematuria, or increased frequency.  Denies any abnormal vaginal discharge.  Denies any chest pain or shortness of breath.   Abdominal Pain      Home Medications Prior to Admission medications   Medication Sig Start Date End Date Taking? Authorizing Provider  albuterol (VENTOLIN HFA) 108 (90 Base) MCG/ACT inhaler 1-2 puff as needed 04/03/22   [provider]  Ascorbic Acid (VITAMIN C) 250 MG CHEW     [provider]  atorvastatin  (LIPITOR) 40 MG tablet TAKE 1 TABLET BY MOUTH EVERY DAY 08/31/23   Tabori, Katherine E, MD  AUVI-Q 0.3 MG/0.3ML SOAJ injection INJECT AS NEEDED FOR SEVERE ALLERGIC REACTION INCLUDING ANAPHYLAXIS AS DIRECTED 12/10/17   [provider]  cetirizine (ZYRTEC) 10 MG tablet Take 10 mg by mouth daily.    [provider]  Chromium Picolinate 200 MCG TABS  06/18/19   [provider]  Bruna Capers POWD  06/18/19   [provider]  empagliflozin  (JARDIANCE ) 10 MG TABS tablet Take 1 tablet (10 mg total) by mouth daily before breakfast. 10/07/23   Tabori, Katherine  E, MD  fish oil-omega-3 fatty acids 1000 MG capsule Take 2 g by mouth daily.    [provider]  FLUOCINOLONE ACETONIDE SCALP 0.01 % OIL Pt uses oil on scalp once a week. 08/04/13   [provider]  fluticasone (FLONASE) 50 MCG/ACT nasal spray Place 2 sprays into both nostrils daily as needed.    [provider]  ketoconazole (NIZORAL) 2 % shampoo SMARTSIG:5 Milliliter(s) Topical 3 Times a Week 09/22/19   [provider]  meclizine  (ANTIVERT ) 25 MG tablet Take 1 tablet (25 mg total) by mouth 3 (three) times daily as needed for dizziness. 10/07/23   Tabori, Katherine E, MD  metFORMIN  (GLUCOPHAGE -XR) 500 MG 24 hr tablet TAKE 1 TABLET BY MOUTH TWICE A DAY 02/22/24   Tabori, Katherine E, MD  omeprazole  (PRILOSEC) 20 MG capsule TAKE 1 CAPSULE BY MOUTH EVERY DAY 11/25/23   Tabori, Katherine E, MD  ondansetron  (ZOFRAN ) 4 MG tablet Take 1 tablet (4 mg total) by mouth every 8 (eight) hours as needed for nausea or vomiting. 10/07/23   Tabori, Katherine E, MD  ramipril  (ALTACE ) 2.5 MG capsule TAKE 1 CAPSULE BY MOUTH EVERY DAY 02/22/24   Tabori, Katherine E, MD      Allergies    Aspirin and Simvastatin    Review of Systems   Review of Systems  Gastrointestinal:  Positive for abdominal pain.    Physical Exam Updated Vital Signs BP (!) 147/79  Pulse 88   Temp 98 F (36.7 C)   Resp 18   SpO2 100%  Physical Exam Vitals and nursing note reviewed.  Constitutional:      General: She is not in acute distress.    Appearance: She is well-developed.     Comments: Well-appearing, no vomiting  HENT:     Head: Normocephalic and atraumatic.  Eyes:     Conjunctiva/sclera: Conjunctivae normal.  Cardiovascular:     Rate and Rhythm: Normal rate and regular rhythm.     Heart sounds: No murmur heard. Pulmonary:     Effort: Pulmonary effort is normal. No respiratory distress.     Breath sounds: Normal breath sounds.  Abdominal:     Palpations: Abdomen is soft.     Tenderness:  There is no abdominal tenderness.     Comments: No abdominal tenderness to palpation on exam.  No rebound or guarding  No CVA tenderness bilaterally  Musculoskeletal:        General: No swelling.     Cervical back: Neck supple.  Skin:    General: Skin is warm and dry.     Capillary Refill: Capillary refill takes less than 2 seconds.  Neurological:     Mental Status: She is alert.  Psychiatric:        Mood and Affect: Mood normal.     ED Results / Procedures / Treatments   Labs (all labs ordered are listed, but only abnormal results are displayed) Labs Reviewed  URINALYSIS, ROUTINE W REFLEX MICROSCOPIC - Abnormal; Notable for the following components:      Result Value   Glucose, UA >1,000 (*)    Crystals PRESENT (*)    All other components within normal limits  COMPREHENSIVE METABOLIC PANEL WITH GFR - Abnormal; Notable for the following components:   Glucose, Bld 232 (*)    Calcium  11.0 (*)    All other components within normal limits  LIPASE, BLOOD  CBC  TROPONIN T, HIGH SENSITIVITY    EKG None  Radiology CT ABDOMEN PELVIS W CONTRAST Result Date: 04/24/2024 CLINICAL DATA:  RLQ pain. EXAM: CT ABDOMEN AND PELVIS WITH CONTRAST TECHNIQUE: Multidetector CT imaging of the abdomen and pelvis was performed using the standard protocol following bolus administration of intravenous contrast. RADIATION DOSE REDUCTION: This exam was performed according to the departmental dose-optimization program which includes automated exposure control, adjustment of the mA and/or kV according to patient size and/or use of iterative reconstruction technique. CONTRAST:  100mL OMNIPAQUE IOHEXOL 300 MG/ML  SOLN COMPARISON:  None Available. FINDINGS: Lower chest: No acute abnormality. No pericardial or pleural effusions. Hepatobiliary: No focal liver abnormality is seen. No gallstones, gallbladder wall thickening, or biliary dilatation. Pancreas: Unremarkable. No pancreatic ductal dilatation or surrounding  inflammatory changes. Spleen: Normal in size without focal abnormality. Adrenals/Urinary Tract: Adrenal glands are unremarkable. Kidneys are normal, without renal calculi, focal lesion, or hydronephrosis. Bladder is unremarkable. Stomach/Bowel: Stomach is within normal limits. Appendix appears normal. No evidence of bowel wall thickening, distention, or inflammatory changes. Vascular/Lymphatic: No significant vascular findings are present. No enlarged abdominal or pelvic lymph nodes. Reproductive: Status post hysterectomy. No adnexal masses. Other: No abdominal wall hernia or abnormality. No abdominopelvic ascites. Musculoskeletal: No acute or significant osseous findings. IMPRESSION: Unremarkable examination of the abdomen and pelvis. Electronically Signed   By: Sydell Eva M.D.   On: 04/24/2024 21:48    Procedures Procedures    Medications Ordered in ED Medications  iohexol (OMNIPAQUE) 300 MG/ML solution 100 mL (100  mLs Intravenous Contrast Given 04/24/24 2138)    ED Course/ Medical Decision Making/ A&P                                 Medical Decision Making Amount and/or Complexity of Data Reviewed Labs: ordered. Radiology: ordered.  Risk Prescription drug management.     Differential diagnosis includes but is not limited to Cholelithiasis, cholangitis, choledocholithiasis, peptic ulcer, gastritis, gastroenteritis, appendicitis, IBS, IBD, DKA, nephrolithiasis, UTI, pyelonephritis, pancreatitis, diverticulitis, mesenteric ischemia, abdominal aortic aneurysm   ED Course:  Upon initial evaluation, patient is well-appearing, stable vital signs aside from mildly elevated blood pressure 147/79.  She reports pain resolved prior to arrival.  Has not had any pain since being in the emergency room.  Her abdomen is soft and nontender to palpation, no rebound or guarding.  No vomiting.  Labs had already resulted by the time I saw the patient.  We discussed that her CBC, CMP, and urine are  overall unremarkable.  No explanation to pain on labs at this time.  Had a shared decision-making conversation with the patient regarding obtaining CT imaging vs watching and waiting given the resolution of symptoms and unremarkable labs and stable vitals.  She would like to obtain CT imaging to rule out any underlying pathology as she states this seemed somewhat similar to previous kidney stone.  Feel this is appropriate given the severe nature of the pain to rule out any underling emergent etiology.  Will obtain CT abdomen pelvis for further evaluation.  Labs Ordered: I Ordered, and personally interpreted labs.  The pertinent results include:   CBC within normal limits CMP with elevated glucose at 232, elevated calcium  at 11.  Otherwise normal electrolytes.  Normal LFTs and creatinine Lipase within normal limits Urinalysis with large amount of glucose.  No nitrates or leukocytes.  Crystals and mucus present Troponin under 15  Imaging Studies ordered: I ordered imaging studies including CT abdomen and pelvis I independently visualized the imaging with scope of interpretation limited to determining acute life threatening conditions related to emergency care. Imaging showed no acute abnormalities I agree with the radiologist interpretation   Cardiac Monitoring: / EKG: The patient was maintained on a cardiac monitor.  I personally viewed and interpreted the cardiac monitored which showed an underlying rhythm of: Normal sinus rhythm   Upon re-evaluation, patient still well-appearing with stable vitals.  Remains pain-free.  No further episodes of nausea or vomiting.  CT scan unremarkable, no explanation to her pain.  Troponin under 15, no chest pain or shortness of breath, cardiac monitor with normal sinus rhythm and no ST changes, very low concern that this abdominal pain was a manifestation of ACS/cardiac etiology.  Feel she is stable and appropriate for discharge home this  time    Impression: Right lower quadrant pain, now resolved  Disposition:  The patient was discharged home with instructions to take Tylenol  and ibuprofen as needed for pain. Return precautions given.    This chart was dictated using voice recognition software, Dragon. Despite the best efforts of this provider to proofread and correct errors, errors may still occur which can change documentation meaning.          Final Clinical Impression(s) / ED Diagnoses Final diagnoses:  Right lower quadrant abdominal pain    Rx / DC Orders ED Discharge Orders     None         Rexie Catena, PA-C  04/24/24 2206    Hershel Los, MD 04/27/24 2035

## 2024-04-24 NOTE — ED Triage Notes (Signed)
 Lower right abd pain into right back started around 1500. Has improved on arrival. Last BM today. Nausea no emesis. Denies dysuria.

## 2024-04-24 NOTE — Discharge Instructions (Addendum)
 Your workup is reassuring today.  Your CT scan did not show any abnormalities to explain your pain.  Your kidney, liver, and pancreas labs were normal.  Your urine did not show any signs of infection  The heart test (troponin) was normal.  Pain is unlikely an issue in your heart.  Your calcium  level was slightly high here today.  If you are taking any calcium  supplements, please discontinue these.  Please have your PCP recheck this value within the next month.  You may take up to 1000mg  of tylenol  every 6 hours as needed for pain.  Do not take more then 4g per day.  You may use up to 600mg  ibuprofen every 6 hours as needed for pain.  Do not exceed 2.4g of ibuprofen per day.  Return to the ER if you have any worsening abdominal pain, uncontrolled vomiting, fevers, any other new or concerning symptoms

## 2024-05-09 ENCOUNTER — Other Ambulatory Visit: Payer: Self-pay | Admitting: Family Medicine

## 2024-05-09 DIAGNOSIS — E119 Type 2 diabetes mellitus without complications: Secondary | ICD-10-CM

## 2024-05-09 MED ORDER — EMPAGLIFLOZIN 10 MG PO TABS: 10.0000 mg | ORAL_TABLET | Freq: Every day | ORAL | 1 refills | Status: DC

## 2024-05-09 NOTE — Telephone Encounter (Unsigned)
 Copied from CRM [id number]. Topic: Clinical - Medication Refill >> May 09, 2024  8:25 AM [place] wrote: Medication: empagliflozin  (JARDIANCE ) 10 MG TABS tablet  Has the patient contacted their pharmacy? Yes (Agent: If no, request that the patient contact the pharmacy for the refill. If patient does not wish to contact the pharmacy document the reason why and proceed with request.) (Agent: If yes, when and what did the pharmacy advise?)  This is the patient's preferred pharmacy:  [id number] [place], [place] - [address] [place] [address] Phone: [phone] Fax: [phone]  Is this the correct pharmacy for this prescription? Yes If no, delete pharmacy and type the correct one.   Has the prescription been filled recently? Yes  Is the patient out of the medication? Yes  Has the patient been seen for an appointment in the last year OR does the patient have an upcoming appointment? Yes  Can we respond through MyChart? Yes  Agent: Please be advised that Rx refills may take up to 3 business days. We ask that you follow-up with your pharmacy.

## 2024-05-22 ENCOUNTER — Other Ambulatory Visit: Payer: Self-pay | Admitting: Family Medicine

## 2024-05-23 ENCOUNTER — Other Ambulatory Visit: Payer: Self-pay | Admitting: Family Medicine

## 2024-07-07 ENCOUNTER — Ambulatory Visit: Payer: Self-pay | Admitting: Family Medicine

## 2024-07-07 ENCOUNTER — Ambulatory Visit (INDEPENDENT_AMBULATORY_CARE_PROVIDER_SITE_OTHER): Admitting: Family Medicine

## 2024-07-07 ENCOUNTER — Encounter: Payer: Self-pay | Admitting: Family Medicine

## 2024-07-07 VITALS — BP 100/60 | HR 75 | Temp 97.8°F | Wt 150.2 lb

## 2024-07-07 DIAGNOSIS — Z7984 Long term (current) use of oral hypoglycemic drugs: Secondary | ICD-10-CM | POA: Diagnosis not present

## 2024-07-07 DIAGNOSIS — E785 Hyperlipidemia, unspecified: Secondary | ICD-10-CM | POA: Diagnosis not present

## 2024-07-07 DIAGNOSIS — I10 Essential (primary) hypertension: Secondary | ICD-10-CM

## 2024-07-07 DIAGNOSIS — E119 Type 2 diabetes mellitus without complications: Secondary | ICD-10-CM

## 2024-07-07 DIAGNOSIS — E1169 Type 2 diabetes mellitus with other specified complication: Secondary | ICD-10-CM

## 2024-07-07 LAB — BASIC METABOLIC PANEL WITH GFR
BUN: 9 mg/dL (ref 6–23)
CO2: 31 meq/L (ref 19–32)
Calcium: 9.1 mg/dL (ref 8.4–10.5)
Chloride: 102 meq/L (ref 96–112)
Creatinine, Ser: 0.62 mg/dL (ref 0.40–1.20)
GFR: 99.05 mL/min (ref >60.00)
Glucose, Bld: 101 mg/dL — ABNORMAL HIGH (ref 70–99)
Potassium: 4 meq/L (ref 3.5–5.1)
Sodium: 140 meq/L (ref 135–145)

## 2024-07-07 LAB — LIPID PANEL
Cholesterol: 183 mg/dL (ref 0–200)
HDL: 60.1 mg/dL (ref >39.00)
LDL Cholesterol: 110 mg/dL — ABNORMAL HIGH (ref 0–99)
NonHDL: 123.21
Total CHOL/HDL Ratio: 3
Triglycerides: 67 mg/dL (ref 0.0–149.0)
VLDL: 13.4 mg/dL (ref 0.0–40.0)

## 2024-07-07 LAB — CBC WITH DIFFERENTIAL/PLATELET
Basophils Absolute: 0 K/uL (ref 0.0–0.1)
Basophils Relative: 0.6 % (ref 0.0–3.0)
Eosinophils Absolute: 0 K/uL (ref 0.0–0.7)
Eosinophils Relative: 0.7 % (ref 0.0–5.0)
HCT: 38.8 % (ref 36.0–46.0)
Hemoglobin: 12.7 g/dL (ref 12.0–15.0)
Lymphocytes Relative: 53 % — ABNORMAL HIGH (ref 12.0–46.0)
Lymphs Abs: 2.3 K/uL (ref 0.7–4.0)
MCHC: 32.6 g/dL (ref 30.0–36.0)
MCV: 91.3 fl (ref 78.0–100.0)
Monocytes Absolute: 0.2 K/uL (ref 0.1–1.0)
Monocytes Relative: 5.1 % (ref 3.0–12.0)
Neutro Abs: 1.7 K/uL (ref 1.4–7.7)
Neutrophils Relative %: 40.6 % — ABNORMAL LOW (ref 43.0–77.0)
Platelets: 344 K/uL (ref 150.0–400.0)
RBC: 4.25 Mil/uL (ref 3.87–5.11)
RDW: 14.9 % (ref 11.5–15.5)
WBC: 4.3 K/uL (ref 4.0–10.5)

## 2024-07-07 LAB — HEPATIC FUNCTION PANEL
ALT: 24 U/L (ref 0–35)
AST: 19 U/L (ref 0–37)
Albumin: 4.3 g/dL (ref 3.5–5.2)
Alkaline Phosphatase: 37 U/L — ABNORMAL LOW (ref 39–117)
Bilirubin, Direct: 0.2 mg/dL (ref 0.0–0.3)
Total Bilirubin: 0.7 mg/dL (ref 0.2–1.2)
Total Protein: 7 g/dL (ref 6.0–8.3)

## 2024-07-07 LAB — HEMOGLOBIN A1C: Hgb A1c MFr Bld: 7.1 % — ABNORMAL HIGH (ref 4.6–6.5)

## 2024-07-07 LAB — TSH: TSH: 2.1 u[IU]/mL (ref 0.35–5.50)

## 2024-07-07 NOTE — Progress Notes (Signed)
   Subjective:    Patient ID: Sonia Little, female    DOB: [birth year], 58 y.o.   MRN: [id number]  HPI HTN- chronic problem, on Ramipril  2.5mg  daily w/ good control.  No CP, SOB, HA's, visual changes, edema.  Hyperlipidemia- chronic problem, on Lipitor 40mg  daily.  No abd pain, N/V.  DM- chronic problem, on Metformin  XR 500mg  BID, Jardiance  10mg  daily.  UTD on eye exam.  Due for foot exam.  UTD on microalbumin.  Denies symptomatic lows.  No numbness/tingling of hands/feet.   Review of Systems For ROS see HPI     Objective:   Physical Exam Vitals reviewed.  Constitutional:      General: She is not in acute distress.    Appearance: Normal appearance. She is well-developed. She is not ill-appearing.  HENT:     Head: Normocephalic and atraumatic.  Eyes:     Conjunctiva/sclera: Conjunctivae normal.     Pupils: Pupils are equal, round, and reactive to light.  Neck:     Thyroid : No thyromegaly.  Cardiovascular:     Rate and Rhythm: Normal rate and regular rhythm.     Pulses: Normal pulses.     Heart sounds: Normal heart sounds. No murmur heard. Pulmonary:     Effort: Pulmonary effort is normal. No respiratory distress.     Breath sounds: Normal breath sounds.  Abdominal:     General: There is no distension.     Palpations: Abdomen is soft.     Tenderness: There is no abdominal tenderness.  Musculoskeletal:     Cervical back: Normal range of motion and neck supple.     Right lower leg: No edema.     Left lower leg: No edema.  Lymphadenopathy:     Cervical: No cervical adenopathy.  Skin:    General: Skin is warm and dry.  Neurological:     General: No focal deficit present.     Mental Status: She is alert and oriented to person, place, and time.  Psychiatric:        Mood and Affect: Mood normal.        Behavior: Behavior normal.        Thought Content: Thought content normal.           Assessment & Plan:

## 2024-07-07 NOTE — Patient Instructions (Signed)
 Schedule your complete physical in 3-4 months We'll notify you of your lab results and make any changes if needed Keep up the good work on healthy diet and regular exercise- you look awesome! Call with any questions or concerns Stay Safe!  Stay Healthy! Enjoy the rest of your summer!!

## 2024-07-12 NOTE — Assessment & Plan Note (Signed)
 Chronic problem.  On Lipitor 40mg  daily w/o difficulty.  Check labs.  Adjust meds prn  ?

## 2024-07-12 NOTE — Assessment & Plan Note (Signed)
 Chronic problem.  On Metformin  and Jardiance  w/o difficulty.  UTD on eye exam, microalbumin.  Foot exam due.  Currently asymptomatic.  Check labs.  Adjust meds prn

## 2024-07-12 NOTE — Assessment & Plan Note (Signed)
 Chronic problem.  On Ramipril  w/ good control.  Currently asymptomatic.  Check labs due to ACE use but no anticipated med changes.  Will follow.

## 2024-08-22 ENCOUNTER — Other Ambulatory Visit: Payer: Self-pay | Admitting: Family Medicine

## 2024-08-22 ENCOUNTER — Ambulatory Visit (INDEPENDENT_AMBULATORY_CARE_PROVIDER_SITE_OTHER)

## 2024-08-22 DIAGNOSIS — Z23 Encounter for immunization: Secondary | ICD-10-CM | POA: Diagnosis not present

## 2024-08-22 NOTE — Progress Notes (Signed)
 Sonia Little is a 58 y.o. female presents to the office today for Flu Vaccine per physician's orders. Injection was administered Intramuscular Right deltoid.   Patient's next injection due n/a, appt made? not applicable  [employer]

## 2024-10-18 ENCOUNTER — Ambulatory Visit: Admitting: Family Medicine

## 2024-10-18 ENCOUNTER — Encounter: Payer: Self-pay | Admitting: Family Medicine

## 2024-10-18 VITALS — BP 128/70 | HR 75 | Temp 97.8°F | Ht 59.5 in | Wt 150.2 lb

## 2024-10-18 DIAGNOSIS — E119 Type 2 diabetes mellitus without complications: Secondary | ICD-10-CM | POA: Diagnosis not present

## 2024-10-18 DIAGNOSIS — Z0000 Encounter for general adult medical examination without abnormal findings: Secondary | ICD-10-CM | POA: Diagnosis not present

## 2024-10-18 DIAGNOSIS — Z7984 Long term (current) use of oral hypoglycemic drugs: Secondary | ICD-10-CM | POA: Diagnosis not present

## 2024-10-18 LAB — BASIC METABOLIC PANEL WITH GFR
BUN: 12 mg/dL (ref 6–23)
CO2: 31 meq/L (ref 19–32)
Calcium: 9.9 mg/dL (ref 8.4–10.5)
Chloride: 102 meq/L (ref 96–112)
Creatinine, Ser: 0.6 mg/dL (ref 0.40–1.20)
GFR: 99.64 mL/min (ref >60.00)
Glucose, Bld: 99 mg/dL (ref 70–99)
Potassium: 4.1 meq/L (ref 3.5–5.1)
Sodium: 139 meq/L (ref 135–145)

## 2024-10-18 LAB — TSH: TSH: 3.1 u[IU]/mL (ref 0.35–5.50)

## 2024-10-18 LAB — HEPATIC FUNCTION PANEL
ALT: 28 U/L (ref 0–35)
AST: 21 U/L (ref 0–37)
Albumin: 4.7 g/dL (ref 3.5–5.2)
Alkaline Phosphatase: 48 U/L (ref 39–117)
Bilirubin, Direct: 0.1 mg/dL (ref 0.0–0.3)
Total Bilirubin: 0.7 mg/dL (ref 0.2–1.2)
Total Protein: 7.4 g/dL (ref 6.0–8.3)

## 2024-10-18 LAB — LIPID PANEL
Cholesterol: 195 mg/dL (ref 0–200)
HDL: 58.9 mg/dL (ref >39.00)
LDL Cholesterol: 117 mg/dL — ABNORMAL HIGH (ref 0–99)
NonHDL: 135.98
Total CHOL/HDL Ratio: 3
Triglycerides: 97 mg/dL (ref 0.0–149.0)
VLDL: 19.4 mg/dL (ref 0.0–40.0)

## 2024-10-18 LAB — CBC WITH DIFFERENTIAL/PLATELET
Basophils Absolute: 0 K/uL (ref 0.0–0.1)
Basophils Relative: 0.5 % (ref 0.0–3.0)
Eosinophils Absolute: 0 K/uL (ref 0.0–0.7)
Eosinophils Relative: 0.7 % (ref 0.0–5.0)
HCT: 40.7 % (ref 36.0–46.0)
Hemoglobin: 13.4 g/dL (ref 12.0–15.0)
Lymphocytes Relative: 52.4 % — ABNORMAL HIGH (ref 12.0–46.0)
Lymphs Abs: 2.1 K/uL (ref 0.7–4.0)
MCHC: 32.8 g/dL (ref 30.0–36.0)
MCV: 90.7 fl (ref 78.0–100.0)
Monocytes Absolute: 0.3 K/uL (ref 0.1–1.0)
Monocytes Relative: 7.1 % (ref 3.0–12.0)
Neutro Abs: 1.6 K/uL (ref 1.4–7.7)
Neutrophils Relative %: 39.3 % — ABNORMAL LOW (ref 43.0–77.0)
Platelets: 368 K/uL (ref 150.0–400.0)
RBC: 4.48 Mil/uL (ref 3.87–5.11)
RDW: 14.4 % (ref 11.5–15.5)
WBC: 4.1 K/uL (ref 4.0–10.5)

## 2024-10-18 LAB — HEMOGLOBIN A1C: Hgb A1c MFr Bld: 6.7 % — ABNORMAL HIGH (ref 4.6–6.5)

## 2024-10-18 NOTE — Progress Notes (Signed)
   Subjective:    Patient ID: Sonia Little, female    DOB: [birth year], 58 y.o.   MRN: [id number]  HPI CPE- UTD on foot exam (podiatry), mammo, eye exam, colonoscopy, flu, PNA.  Health Maintenance  Topic Date Due   Hepatitis C Screening  Never done   Hepatitis B Vaccines 19-59 Average Risk (1 of 3 - 19+ 3-dose series) Never done   Zoster Vaccines- Shingrix (1 of 2) Never done   FOOT EXAM  07/07/2024   COVID-19 Vaccine (5 - 2025-26 season) 07/18/2024   Mammogram  12/09/2024   HIV Screening  01/07/2025 (Originally 05/18/1981)   OPHTHALMOLOGY EXAM  11/08/2024   HEMOGLOBIN A1C  01/07/2025   Diabetic kidney evaluation - Urine ACR  04/06/2025   Diabetic kidney evaluation - eGFR measurement  07/07/2025   Colonoscopy  11/10/2027   DTaP/Tdap/Td (4 - Td or Tdap) 08/14/2029   Pneumococcal Vaccine: 50+ Years  Completed   Influenza Vaccine  Completed   HPV VACCINES  Aged Out   Meningococcal B Vaccine  Aged Out      Review of Systems Patient reports no vision/ hearing changes, adenopathy,fever, weight change,  persistant/recurrent hoarseness , swallowing issues, chest pain, palpitations, edema, persistant/recurrent cough, hemoptysis, dyspnea (rest/exertional/paroxysmal nocturnal), gastrointestinal bleeding (melena, rectal bleeding), abdominal pain, significant heartburn, bowel changes, GU symptoms (dysuria, hematuria, incontinence), Gyn symptoms (abnormal  bleeding, pain),  syncope, focal weakness, memory loss, numbness & tingling, skin/hair/nail changes, abnormal bruising or bleeding, anxiety, or depression.     Objective:   Physical Exam General Appearance:    Alert, cooperative, no distress, appears stated age  Head:    Normocephalic, without obvious abnormality, atraumatic  Eyes:    PERRL, conjunctiva/corneas clear, EOM's intact both eyes  Ears:    Normal TM's and external ear canals, both ears  Nose:   Nares normal, septum midline, mucosa normal, no drainage    or sinus tenderness   Throat:   Lips, mucosa, and tongue normal; teeth and gums normal  Neck:   Supple, symmetrical, trachea midline, no adenopathy;    Thyroid : no enlargement/tenderness/nodules  Back:     Symmetric, no curvature, ROM normal, no CVA tenderness  Lungs:     Clear to auscultation bilaterally, respirations unlabored  Chest Wall:    No tenderness or deformity   Heart:    Regular rate and rhythm, S1 and S2 normal, no murmur, rub   or gallop  Breast Exam:    Deferred to mammo  Abdomen:     Soft, non-tender, bowel sounds active all four quadrants,    no masses, no organomegaly  Genitalia:    Deferred  Rectal:    Extremities:   Extremities normal, atraumatic, no cyanosis or edema  Pulses:   2+ and symmetric all extremities  Skin:   Skin color, texture, turgor normal, no rashes or lesions  Lymph nodes:   Cervical, supraclavicular, and axillary nodes normal  Neurologic:   CNII-XII intact, normal strength, sensation and reflexes    throughout          Assessment & Plan:

## 2024-10-18 NOTE — Assessment & Plan Note (Signed)
 Pt's PE WNL.  UTD on mammo, colonoscopy, eye exam, foot exam, flu, and PNA vaccines.  Check labs.  Anticipatory guidance provided.

## 2024-10-18 NOTE — Patient Instructions (Signed)
 Follow up in 3-4 months to recheck diabetes We'll notify you of your lab results and make any changes if needed Continue to work on healthy diet and regular exercise- you're doing great! Call with any questions or concerns Stay Safe!  Stay Healthy! Merry Christmas!!!

## 2024-10-19 ENCOUNTER — Ambulatory Visit: Payer: Self-pay | Admitting: Family Medicine

## 2024-10-31 ENCOUNTER — Other Ambulatory Visit: Payer: Self-pay | Admitting: Family Medicine

## 2024-10-31 DIAGNOSIS — Z1231 Encounter for screening mammogram for malignant neoplasm of breast: Secondary | ICD-10-CM

## 2024-11-03 ENCOUNTER — Other Ambulatory Visit: Payer: Self-pay | Admitting: Family Medicine

## 2024-11-03 DIAGNOSIS — E119 Type 2 diabetes mellitus without complications: Secondary | ICD-10-CM

## 2024-11-15 LAB — OPHTHALMOLOGY REPORT-SCANNED

## 2024-11-18 ENCOUNTER — Other Ambulatory Visit: Payer: Self-pay | Admitting: Family Medicine

## 2024-11-19 ENCOUNTER — Other Ambulatory Visit: Payer: Self-pay | Admitting: Family Medicine

## 2024-11-20 ENCOUNTER — Other Ambulatory Visit: Payer: Self-pay | Admitting: Family Medicine

## 2024-12-09 ENCOUNTER — Other Ambulatory Visit: Payer: Self-pay | Admitting: Family Medicine

## 2024-12-09 DIAGNOSIS — Z0000 Encounter for general adult medical examination without abnormal findings: Secondary | ICD-10-CM

## 2024-12-12 ENCOUNTER — Ambulatory Visit

## 2024-12-20 ENCOUNTER — Ambulatory Visit

## 2024-12-27 ENCOUNTER — Ambulatory Visit

## 2025-01-16 ENCOUNTER — Ambulatory Visit: Admitting: Family Medicine
